# Patient Record
Sex: Female | Born: 1965 | Race: White | Hispanic: No | Marital: Married | Smoking: Never smoker
Health system: Southern US, Community
[De-identification: ages and names within clinical notes are randomized; demographics above are authoritative.]

## PROBLEM LIST (undated history)

## (undated) DIAGNOSIS — R011 Cardiac murmur, unspecified: Secondary | ICD-10-CM

## (undated) DIAGNOSIS — I10 Essential (primary) hypertension: Secondary | ICD-10-CM

## (undated) DIAGNOSIS — T7840XA Allergy, unspecified, initial encounter: Secondary | ICD-10-CM

## (undated) DIAGNOSIS — N390 Urinary tract infection, site not specified: Secondary | ICD-10-CM

## (undated) DIAGNOSIS — K219 Gastro-esophageal reflux disease without esophagitis: Secondary | ICD-10-CM

## (undated) HISTORY — DX: Urinary tract infection, site not specified: N39.0

## (undated) HISTORY — PX: WISDOM TOOTH EXTRACTION: SHX21

## (undated) HISTORY — DX: Allergy, unspecified, initial encounter: T78.40XA

## (undated) HISTORY — DX: Cardiac murmur, unspecified: R01.1

## (undated) HISTORY — DX: Essential (primary) hypertension: I10

---

## 2009-03-02 ENCOUNTER — Ambulatory Visit: Payer: Self-pay | Admitting: Internal Medicine

## 2011-10-10 ENCOUNTER — Encounter: Payer: Self-pay | Admitting: Internal Medicine

## 2011-10-10 ENCOUNTER — Ambulatory Visit (INDEPENDENT_AMBULATORY_CARE_PROVIDER_SITE_OTHER): Payer: PRIVATE HEALTH INSURANCE | Admitting: Internal Medicine

## 2011-10-10 VITALS — BP 154/82 | HR 68 | Temp 98.3°F | Resp 14 | Ht 64.0 in | Wt 132.5 lb

## 2011-10-10 DIAGNOSIS — M7712 Lateral epicondylitis, left elbow: Secondary | ICD-10-CM

## 2011-10-10 DIAGNOSIS — R030 Elevated blood-pressure reading, without diagnosis of hypertension: Secondary | ICD-10-CM

## 2011-10-10 DIAGNOSIS — M7710 Lateral epicondylitis, unspecified elbow: Secondary | ICD-10-CM

## 2011-10-10 DIAGNOSIS — Z0000 Encounter for general adult medical examination without abnormal findings: Secondary | ICD-10-CM | POA: Insufficient documentation

## 2011-10-10 NOTE — Assessment & Plan Note (Addendum)
General medical exam is normal today. Patient is up-to-date on Pap smear and mammogram. Will check basic lab work including CBC, CMP, lipid profile, vitamin D. BMI normal. Encouraged continued efforts at healthy diet and regular physical activity. Will obtain records from previous physician. Followup in one year or sooner if needed.

## 2011-10-10 NOTE — Patient Instructions (Signed)
Lateral Epicondylitis (Tennis Elbow) with Rehab Lateral epicondylitis involves inflammation and pain around the outer portion of the elbow. The pain is caused by inflammation of the tendons in the forearm that bring back (extend) the wrist. Lateral epicondylittis is also called tennis elbow, because it is very common in tennis players. However, it may occur in any individual who extends the wrist repetitively. If lateral epicondylitis is left untreated, it may become a chronic problem. SYMPTOMS   Pain, tenderness, and inflammation on the outer (lateral) side of the elbow.   Pain or weakness with gripping activities.   Pain that increases with wrist twisting motions (playing tennis, using a screwdriver, opening a door or a jar).   Pain with lifting objects, including a coffee cup.  CAUSES  Lateral epicondylitis is caused by inflammation of the tendons that extend the wrist. Causes of injury may include:  Repetitive stress and strain on the muscles and tendons that extend the wrist.   Sudden change in activity level or intensity.   Incorrect grip in racquet sports.   Incorrect grip size of racquet (often too large).   Incorrect hitting position or technique (usually backhand, leading with the elbow).   Using a racket that is too heavy.  RISK INCREASES WITH:  Sports or occupations that require repetitive and/or strenuous forearm and wrist movements (tennis, squash, racquetball, carpentry).   Poor wrist and forearm strength and flexibility.   Failure to warm up properly before activity.   Resuming activity before healing, rehabilitation, and conditioning are complete.  PREVENTION   Warm up and stretch properly before activity.   Maintain physical fitness:   Strength, flexibility, and endurance.   Cardiovascular fitness.   Wear and use properly fitted equipment.   Learn and use proper technique and have a coach correct improper technique.   Wear a tennis elbow  (counterforce) brace.  PROGNOSIS  The course of this condition depends on the degree of the injury. If treated properly, acute cases (symptoms lasting less than 4 weeks) are often resolved in 2 to 6 weeks. Chronic (longer lasting cases) often resolve in 3 to 6 months, but may require physical therapy. RELATED COMPLICATIONS   Frequently recurring symptoms, resulting in a chronic problem. Properly treating the problem the first time decreases frequency of recurrence.   Chronic inflammation, scarring tendon degeneration, and partial tendon tear, requiring surgery.   Delayed healing or resolution of symptoms.  TREATMENT  Treatment first involves the use of ice and medicine, to reduce pain and inflammation. Strengthening and stretching exercises may help reduce discomfort, if performed regularly. These exercises may be performed at home, if the condition is an acute injury. Chronic cases may require a referral to a physical therapist for evaluation and treatment. Your caregiver may advise a corticosteroid injection, to help reduce inflammation. Rarely, surgery is needed. MEDICATION  If pain medicine is needed, nonsteroidal anti-inflammatory medicines (aspirin and ibuprofen), or other minor pain relievers (acetaminophen), are often advised.   Do not take pain medicine for 7 days before surgery.   Prescription pain relievers may be given, if your caregiver thinks they are needed. Use only as directed and only as much as you need.   Corticosteroid injections may be recommended. These injections should be reserved only for the most severe cases, because they can only be given a certain number of times.  HEAT AND COLD  Cold treatment (icing) should be applied for 10 to 15 minutes every 2 to 3 hours for inflammation and pain, and immediately   after activity that aggravates your symptoms. Use ice packs or an ice massage.   Heat treatment may be used before performing stretching and strengthening  activities prescribed by your caregiver, physical therapist, or athletic trainer. Use a heat pack or a warm water soak.  SEEK MEDICAL CARE IF: Symptoms get worse or do not improve in 2 weeks, despite treatment. EXERCISES  RANGE OF MOTION (ROM) AND STRETCHING EXERCISES - Epicondylitis, Lateral (Tennis Elbow) These exercises may help you when beginning to rehabilitate your injury. Your symptoms may go away with or without further involvement from your physician, physical therapist or athletic trainer. While completing these exercises, remember:   Restoring tissue flexibility helps normal motion to return to the joints. This allows healthier, less painful movement and activity.   An effective stretch should be held for at least 30 seconds.   A stretch should never be painful. You should only feel a gentle lengthening or release in the stretched tissue.  RANGE OF MOTION - Wrist Flexion, Active-Assisted  Extend your right / left elbow with your fingers pointing down.*   Gently pull the back of your hand towards you, until you feel a gentle stretch on the top of your forearm.   Hold this position for __________ seconds.  Repeat __________ times. Complete this exercise __________ times per day.  *If directed by your physician, physical therapist or athletic trainer, complete this stretch with your elbow bent, rather than extended. RANGE OF MOTION - Wrist Extension, Active-Assisted  Extend your right / left elbow and turn your palm upwards.*   Gently pull your palm and fingertips back, so your wrist extends and your fingers point more toward the ground.   You should feel a gentle stretch on the inside of your forearm.   Hold this position for __________ seconds.  Repeat __________ times. Complete this exercise __________ times per day. *If directed by your physician, physical therapist or athletic trainer, complete this stretch with your elbow bent, rather than extended. STRETCH - Wrist  Flexion  Place the back of your right / left hand on a tabletop, leaving your elbow slightly bent. Your fingers should point away from your body.   Gently press the back of your hand down onto the table by straightening your elbow. You should feel a stretch on the top of your forearm.   Hold this position for __________ seconds.  Repeat __________ times. Complete this stretch __________ times per day.  STRETCH - Wrist Extension   Place your right / left fingertips on a tabletop, leaving your elbow slightly bent. Your fingers should point backwards.   Gently press your fingers and palm down onto the table by straightening your elbow. You should feel a stretch on the inside of your forearm.   Hold this position for __________ seconds.  Repeat __________ times. Complete this stretch __________ times per day.  STRENGTHENING EXERCISES - Epicondylitis, Lateral (Tennis Elbow) These exercises may help you when beginning to rehabilitate your injury. They may resolve your symptoms with or without further involvement from your physician, physical therapist or athletic trainer. While completing these exercises, remember:   Muscles can gain both the endurance and the strength needed for everyday activities through controlled exercises.   Complete these exercises as instructed by your physician, physical therapist or athletic trainer. Increase the resistance and repetitions only as guided.   You may experience muscle soreness or fatigue, but the pain or discomfort you are trying to eliminate should never worsen during these exercises. If   this pain does get worse, stop and make sure you are following the directions exactly. If the pain is still present after adjustments, discontinue the exercise until you can discuss the trouble with your caregiver.  STRENGTH - Wrist Flexors  Sit with your right / left forearm palm-up and fully supported on a table or countertop. Your elbow should be resting below the  height of your shoulder. Allow your wrist to extend over the edge of the surface.   Loosely holding a __________ weight, or a piece of rubber exercise band or tubing, slowly curl your hand up toward your forearm.   Hold this position for __________ seconds. Slowly lower the wrist back to the starting position in a controlled manner.  Repeat __________ times. Complete this exercise __________ times per day.  STRENGTH - Wrist Extensors  Sit with your right / left forearm palm-down and fully supported on a table or countertop. Your elbow should be resting below the height of your shoulder. Allow your wrist to extend over the edge of the surface.   Loosely holding a __________ weight, or a piece of rubber exercise band or tubing, slowly curl your hand up toward your forearm.   Hold this position for __________ seconds. Slowly lower the wrist back to the starting position in a controlled manner.  Repeat __________ times. Complete this exercise __________ times per day.  STRENGTH - Ulnar Deviators  Stand with a ____________________ weight in your right / left hand, or sit while holding a rubber exercise band or tubing, with your healthy arm supported on a table or countertop.   Move your wrist, so that your pinkie travels toward your forearm and your thumb moves away from your forearm.   Hold this position for __________ seconds and then slowly lower the wrist back to the starting position.  Repeat __________ times. Complete this exercise __________ times per day STRENGTH - Radial Deviators  Stand with a ____________________ weight in your right / left hand, or sit while holding a rubber exercise band or tubing, with your injured arm supported on a table or countertop.   Raise your hand upward in front of you or pull up on the rubber tubing.   Hold this position for __________ seconds and then slowly lower the wrist back to the starting position.  Repeat __________ times. Complete this  exercise __________ times per day. STRENGTH - Forearm Supinators   Sit with your right / left forearm supported on a table, keeping your elbow below shoulder height. Rest your hand over the edge, palm down.   Gently grip a hammer or a soup ladle.   Without moving your elbow, slowly turn your palm and hand upward to a "thumbs-up" position.   Hold this position for __________ seconds. Slowly return to the starting position.  Repeat __________ times. Complete this exercise __________ times per day.  STRENGTH - Forearm Pronators   Sit with your right / left forearm supported on a table, keeping your elbow below shoulder height. Rest your hand over the edge, palm up.   Gently grip a hammer or a soup ladle.   Without moving your elbow, slowly turn your palm and hand upward to a "thumbs-up" position.   Hold this position for __________ seconds. Slowly return to the starting position.  Repeat __________ times. Complete this exercise __________ times per day.  STRENGTH - Grip  Grasp a tennis ball, a dense sponge, or a large, rolled sock in your hand.   Squeeze as hard   as you can, without increasing any pain.   Hold this position for __________ seconds. Release your grip slowly.  Repeat __________ times. Complete this exercise __________ times per day.  STRENGTH - Elbow Extensors, Isometric  Stand or sit upright, on a firm surface. Place your right / left arm so that your palm faces your stomach, and it is at the height of your waist.   Place your opposite hand on the underside of your forearm. Gently push up as your right / left arm resists. Push as hard as you can with both arms, without causing any pain or movement at your right / left elbow. Hold this stationary position for __________ seconds.  Gradually release the tension in both arms. Allow your muscles to relax completely before repeating. Document Released: 04/18/2005 Document Revised: 04/07/2011 Document Reviewed:  07/31/2008 [employer] Patient Information 2012 [employer]. 

## 2011-10-10 NOTE — Assessment & Plan Note (Addendum)
Secondary to white coat hypertension. Better control reported at home. Will monitor. Pt will call if consistently >140/90.

## 2011-10-10 NOTE — Progress Notes (Signed)
Subjective:    Patient ID: Erika Daugherty, female    DOB: 1965/12/10, 46 y.o.   MRN: [id number]  HPI 46 year old female presents to establish care. She reports she is generally feeling well. She is concerned about recent weight gain, particularly in her abdomen. She has been exercising and following a healthy diet. She exercises typically 5 days per week. She notes some difficulty getting in fiber because of stomach upset. She denies fatigue, or other focal symptoms.  She also notes several month history of left elbow pain. She works as a Armed forces operational officer and reports that she frequently swings her left forearm during her workday. She has noticed some pain over her lateral epicondyle. She has not taken any medication for this. Pain typically is resolved on the weekends.  Outpatient Encounter Prescriptions as of 10/10/2011  Medication Sig Dispense Refill  . cetirizine (ZYRTEC) 10 MG tablet Take 10 mg by mouth daily.      . Cholecalciferol (VITAMIN D) 2000 UNITS CAPS Take by mouth daily.      Marland Kitchen levonorgestrel-ethinyl estradiol (NORDETTE) 0.15-30 MG-MCG tablet Take 1 tablet by mouth daily.        Review of Systems  Constitutional: Negative for fever, chills, appetite change, fatigue and unexpected weight change.  HENT: Negative for ear pain, congestion, sore throat, trouble swallowing, neck pain, voice change and sinus pressure.   Eyes: Negative for visual disturbance.  Respiratory: Negative for cough, shortness of breath, wheezing and stridor.   Cardiovascular: Negative for chest pain, palpitations and leg swelling.  Gastrointestinal: Negative for nausea, vomiting, abdominal pain, diarrhea, constipation, blood in stool, abdominal distention and anal bleeding.  Genitourinary: Negative for dysuria and flank pain.  Musculoskeletal: Negative for myalgias, arthralgias and gait problem.  Skin: Negative for color change and rash.  Neurological: Negative for dizziness and headaches.  Hematological:  Negative for adenopathy. Does not bruise/bleed easily.  Psychiatric/Behavioral: Negative for suicidal ideas, sleep disturbance and dysphoric mood. The patient is not nervous/anxious.    BP 154/82  Pulse 68  Temp(Src) 98.3 F (36.8 C) (Oral)  Resp 14  Ht 5\' 4"  (1.626 m)  Wt 132 lb 8 oz (60.102 kg)  BMI 22.74 kg/m2  SpO2 98%  LMP 09/09/2011     Objective:   Physical Exam  Constitutional: She is oriented to person, place, and time. She appears well-developed and well-nourished. No distress.  HENT:  Head: Normocephalic and atraumatic.  Right Ear: External ear normal.  Left Ear: External ear normal.  Nose: Nose normal.  Mouth/Throat: Oropharynx is clear and moist. No oropharyngeal exudate.  Eyes: Conjunctivae are normal. Pupils are equal, round, and reactive to light. Right eye exhibits no discharge. Left eye exhibits no discharge. No scleral icterus.  Neck: Normal range of motion. Neck supple. No tracheal deviation present. No thyromegaly present.  Cardiovascular: Normal rate, regular rhythm, normal heart sounds and intact distal pulses.  Exam reveals no gallop and no friction rub.   No murmur heard. Pulmonary/Chest: Effort normal and breath sounds normal. No respiratory distress. She has no wheezes. She has no rales. She exhibits no tenderness.  Musculoskeletal: Normal range of motion. She exhibits no edema and no tenderness.       Left elbow: She exhibits normal range of motion, no swelling and no effusion. tenderness found. Lateral epicondyle tenderness noted.  Lymphadenopathy:    She has no cervical adenopathy.  Neurological: She is alert and oriented to person, place, and time. No cranial nerve deficit. She exhibits normal muscle tone. Coordination normal.  Skin: Skin is warm and dry. No rash noted. She is not diaphoretic. No erythema. No pallor.  Psychiatric: She has a normal mood and affect. Her behavior is normal. Judgment and thought content normal.          Assessment  & Plan:

## 2011-10-10 NOTE — Assessment & Plan Note (Signed)
Symptoms are consistent with lateral epicondylitis of the left elbow. Likely secondary to overuse at work. Patient was given information on lateral epicondylitis. We discussed using ibuprofen 800 mg up to 3 times daily. We also discussed applying ice to her elbow and using a sling to prevent overuse. If symptoms are persisting, she will call or e-mail and we will set up referral to sports medicine.

## 2011-10-11 LAB — CBC WITH DIFFERENTIAL/PLATELET
Basophils Relative: 0.6 % (ref 0.0–3.0)
Eosinophils Relative: 2.6 % (ref 0.0–5.0)
HCT: 40.8 % (ref 36.0–46.0)
Lymphs Abs: 1.8 10*3/uL (ref 0.7–4.0)
MCV: 93.9 fl (ref 78.0–100.0)
Monocytes Absolute: 0.5 10*3/uL (ref 0.1–1.0)
Monocytes Relative: 8.6 % (ref 3.0–12.0)
Neutrophils Relative %: 55.8 % (ref 43.0–77.0)
RBC: 4.34 Mil/uL (ref 3.87–5.11)
WBC: 5.6 10*3/uL (ref 4.5–10.5)

## 2011-10-11 LAB — COMPREHENSIVE METABOLIC PANEL
Albumin: 4.1 g/dL (ref 3.5–5.2)
BUN: 11 mg/dL (ref 6–23)
CO2: 26 mEq/L (ref 19–32)
Calcium: 9.2 mg/dL (ref 8.4–10.5)
Chloride: 107 mEq/L (ref 96–112)
GFR: 79.28 mL/min (ref >60.00)
Glucose, Bld: 76 mg/dL (ref 70–99)
Potassium: 4.1 mEq/L (ref 3.5–5.1)
Total Protein: 7.9 g/dL (ref 6.0–8.3)

## 2011-10-11 LAB — LIPID PANEL: Cholesterol: 157 mg/dL (ref 0–200)

## 2011-10-11 LAB — VITAMIN D 25 HYDROXY (VIT D DEFICIENCY, FRACTURES): Vit D, 25-Hydroxy: 31 ng/mL (ref 30–89)

## 2011-10-11 LAB — TSH: TSH: 0.48 u[IU]/mL (ref 0.35–5.50)

## 2011-10-21 ENCOUNTER — Encounter: Payer: Self-pay | Admitting: Internal Medicine

## 2011-11-17 ENCOUNTER — Encounter: Payer: Self-pay | Admitting: Internal Medicine

## 2011-11-18 ENCOUNTER — Telehealth: Payer: Self-pay | Admitting: Internal Medicine

## 2011-11-18 NOTE — Telephone Encounter (Signed)
LMP: 11/09/11 Caller: Erika Daugherty/Patient; PCP: Ronna Polio; CB#: [phone];  Call regarding High Blood Pressure on 11/17/11= 140/80 and rechecked several times and had one reading up to 160/100 but was feeling anxious about it at the time; Headache behind L eye on 11/15/11 -took Motrin- 2 tabs PO and it went away within 20 min.  Today BP= 120/70. She just started running every other day in June 2013. Triage and Care advice per Hypertension Protocol and advised to check pressure weekly or more often if having sx of headache, anxiousness, or visual disturbances and call for appointment if >140/80 at 2 separate times or with any concerns.

## 2012-01-30 ENCOUNTER — Ambulatory Visit (INDEPENDENT_AMBULATORY_CARE_PROVIDER_SITE_OTHER): Payer: PRIVATE HEALTH INSURANCE | Admitting: Internal Medicine

## 2012-01-30 ENCOUNTER — Encounter: Payer: Self-pay | Admitting: Internal Medicine

## 2012-01-30 VITALS — BP 132/74 | HR 97 | Temp 98.3°F | Ht 63.0 in | Wt 127.5 lb

## 2012-01-30 DIAGNOSIS — R5381 Other malaise: Secondary | ICD-10-CM

## 2012-01-30 DIAGNOSIS — J329 Chronic sinusitis, unspecified: Secondary | ICD-10-CM | POA: Insufficient documentation

## 2012-01-30 DIAGNOSIS — R5383 Other fatigue: Secondary | ICD-10-CM | POA: Insufficient documentation

## 2012-01-30 MED ORDER — AMOXICILLIN 875 MG PO TABS: 875.0000 mg | ORAL_TABLET | Freq: Two times a day (BID) | ORAL | Status: DC

## 2012-01-30 NOTE — Assessment & Plan Note (Signed)
Treat with Amoxicillin 875mg  bid x 10 days.  Saline nasal spray - flush nose at least 2-3x/day.  Afrin nasal spray - 2 sprays each nostril bid x 3 days and then stop.  Plain Robitussin bid prn as directed.  Rest.  Fluids.

## 2012-01-30 NOTE — Patient Instructions (Signed)
It was nice meeting you today.  I do agree that you have a sinus infection.  Take your amoxicillin twice a day as instructed.  Saline nasal spray - flush nose at least 2-3x/day.  Afrin nasal spray - 2 sprays each nostril 2x/day for three days only and then stop.  Plain Robitussin prn as discussed.  Rest.  Fluids.  Return or call if symptoms do not resolve.  Let us know if the fatigue does not resolve with treating the sinus infection.

## 2012-01-30 NOTE — Assessment & Plan Note (Signed)
Feel probably related to her current infection.  Will treat the sinus infection.  If persistant symptoms, she will call or return for further w/up and evaluation.

## 2012-01-30 NOTE — Progress Notes (Signed)
  Subjective:    Patient ID: Erika Daugherty, female    DOB: 03-Aug-1965, 46 y.o.   MRN: [id number]  HPI  46 year old female here as an acute visit with sinus problems.    Past Medical History  Diagnosis Date  . Allergy     Outpatient Prescriptions Prior to Visit  Medication Sig Dispense Refill  . levonorgestrel-ethinyl estradiol (NORDETTE) 0.15-30 MG-MCG tablet Take 1 tablet by mouth daily.      . cetirizine (ZYRTEC) 10 MG tablet Take 10 mg by mouth daily.      . Cholecalciferol (VITAMIN D) 2000 UNITS CAPS Take by mouth daily.        Review of Systems   Reports she has had problems with increased allergy symptoms and sinus issues over the last two months.  Symptoms worsened one week ago.  Reports increased sinus pressure and nasal congestion (occasionally productive of green mucus).   Some ear fullness and discomfort associated with the sinus pressure (involving the left ear).  No documented fever or chills.  No sore throat, but does report increased postnasal drainage.  No increased chest congestion, but does report some minimal cough.  No shortness of breath, chest pain or tightness.  No wheezing.  No nausea or vomiting.  Tolerating po's - without problems.  Some decreased appetite and fatigue.            Objective:   Physical Exam  46 year old in no acute distress.   HEENT:  Nares - clear except sightly erythematous turbinates.  OP- without lesions or erythema.  TMs visualized without increased erythema.  Some cerumen present.  NECK:  Supple, nontender.  No audible abdominal bruit.   HEART:  Appears to be regular. LUNGS:  Without crackles or wheezing audible.  Respirations even and unlabored.                                                                      Assessment & Plan:

## 2012-02-01 ENCOUNTER — Ambulatory Visit (INDEPENDENT_AMBULATORY_CARE_PROVIDER_SITE_OTHER): Payer: PRIVATE HEALTH INSURANCE | Admitting: Internal Medicine

## 2012-02-01 ENCOUNTER — Telehealth: Payer: Self-pay | Admitting: Internal Medicine

## 2012-02-01 ENCOUNTER — Encounter: Payer: Self-pay | Admitting: Internal Medicine

## 2012-02-01 VITALS — BP 128/76 | HR 69 | Temp 98.3°F | Ht 64.0 in | Wt 124.8 lb

## 2012-02-01 DIAGNOSIS — H6980 Other specified disorders of Eustachian tube, unspecified ear: Secondary | ICD-10-CM

## 2012-02-01 MED ORDER — PREDNISONE (PAK) 10 MG PO TABS: ORAL_TABLET | ORAL | Status: DC

## 2012-02-01 NOTE — Telephone Encounter (Signed)
Will address during appt today.

## 2012-02-01 NOTE — Progress Notes (Signed)
Patient ID: Erika Daugherty, female   DOB: Sep 08, 1965, 46 y.o.   MRN: [id number]  Patient Active Problem List  Diagnosis  . General medical exam  . Lateral epicondylitis of left elbow  . Elevated BP  . Sinusitis  . Fatigue  . Eustachian tube dysfunction    Subjective:  CC:   Chief Complaint  Patient presents with  . Otalgia    HPI:   Erika Daugherty a 46 y.o. female who presents Was treated for otitis 2 days by Dr. Lorin Picket with abx and Afrin.   Past Medical History  Diagnosis Date  . Allergy     Past Surgical History  Procedure Date  . Vaginal delivery     2  . Wisdom tooth extraction     The following portions of the patient's history were reviewed and updated as appropriate: Allergies, current medications, and problem list.    Review of Systems:   12 Pt  review of systems was negative except those addressed in the HPI,     History   Social History  . Marital Status: Married    Spouse Name: N/A    Number of Children: N/A  . Years of Education: N/A   Occupational History  . Not on file.   Social History Main Topics  . Smoking status: Never Smoker   . Smokeless tobacco: Never Used  . Alcohol Use: Yes     rare  . Drug Use: No  . Sexually Active: Not on file   Other Topics Concern  . Not on file   Social History Narrative   Lives in [place], [place] with husband and two children (16YO, 11YO) and dog.Work - Chemical engineer - relative healthyExercise - walk or elliptical 5 days per week    Objective:  BP 128/76  Pulse 69  Temp 98.3 F (36.8 C) (Oral)  Ht 5\' 4"  (1.626 m)  Wt 124 lb 12 oz (56.586 kg)  BMI 21.41 kg/m2  SpO2 99%  LMP 12/28/2011  General appearance: alert, cooperative and appears stated age Ears: normal TM's and external ear canals both ears Throat: lips, mucosa, and tongue normal; teeth and gums normal Neck: no adenopathy, no carotid bruit, supple, symmetrical, trachea midline and thyroid not enlarged, symmetric, no  tenderness/mass/nodules Back: symmetric, no curvature. ROM normal. No CVA tenderness. Lungs: clear to auscultation bilaterally Heart: regular rate and rhythm, S1, S2 normal, no murmur, click, rub or gallop Abdomen: soft, non-tender; bowel sounds normal; no masses,  no organomegaly Pulses: 2+ and symmetric Skin: Skin color, texture, turgor normal. No rashes or lesions Lymph nodes: Cervical, supraclavicular, and axillary nodes normal.  Assessment and Plan:  Eustachian tube dysfunction secondary to sinus congestion.  continue afrin add sudafed pe and prednisone    Updated Medication List Outpatient Encounter Prescriptions as of 02/01/2012  Medication Sig Dispense Refill  . amoxicillin (AMOXIL) 875 MG tablet Take 1 tablet (875 mg total) by mouth 2 (two) times daily.  20 tablet  0  . cetirizine (ZYRTEC) 10 MG tablet Take 10 mg by mouth daily.      . Cholecalciferol (VITAMIN D) 2000 UNITS CAPS Take by mouth daily.      Marland Kitchen levonorgestrel-ethinyl estradiol (NORDETTE) 0.15-30 MG-MCG tablet Take 1 tablet by mouth daily.      Marland Kitchen omeprazole (PRILOSEC) 10 MG capsule Take 10 mg by mouth daily.      . predniSONE (STERAPRED UNI-PAK) 10 MG tablet 6 tablets on Day 1 , then reduce by 1 tablet daily until gone  21 tablet  0     No orders of the defined types were placed in this encounter.    No Follow-up on file.

## 2012-02-01 NOTE — Telephone Encounter (Signed)
Caller: [name]/Patient; Patient Name: Erika Daugherty; PCP: Ronna Polio (Adults only); Best Callback Phone Number: [phone] Caller seen in clinic on 01/30/12 and diagnsoed with sinus infection. Dr. Lorin Picket prescribed  Amoxicillen 875mg  bid which patient started the pm of 01/30/12. Patient is also using Afrin nasal spray bid as recommended by MD. [name] calling today because  ear pain has started becoming more prominent since 10/01 and having episodes of diziness. Pain is to left ear and dizziness occurs with turning head to left. 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 Dizziness appeard improved last pm and again worse this am. Patient did not attend work today due to symptoms. Ear pain located to flap covering ear canal and radiates along bone to front of ear up to temporal at times. Denies redness or swelling. Denies fever. All emergent symptoms ruled out per Dizziness or Vertigo with exception " previously evaluated and worsening symptoms interfering with ability to carry out activities of daily living ". Appointment scheduled for patient to be see today (10/02) at 4:15pm by Dr. Darrick Huntsman.   Patient is requesting Dr. Lorin Picket be informed about her worsening symptoms and her appointment scheduled for today.

## 2012-02-01 NOTE — Telephone Encounter (Signed)
Will address at visit today.

## 2012-02-01 NOTE — Patient Instructions (Addendum)
I think your inner ear is causing your symptoms of dizziness and pain.  I am prescribing a prednisone taper to take for the next 6 days    Continue the augmentin.     I would like you to start the sudafed PE 10 to 30 mg every 6 hours,  Omit the evening dose if it wires you  And just use the afrin instead of the pill at bedtime.  Flush your sinuses twice daily with Simply Saline before you use the Afrin

## 2012-02-01 NOTE — Telephone Encounter (Signed)
Pt is still having problems with sinus pain. Pain in ear and very dizzy. She was wondering what else she should do ??

## 2012-02-02 ENCOUNTER — Encounter: Payer: Self-pay | Admitting: Internal Medicine

## 2012-02-02 DIAGNOSIS — H6980 Other specified disorders of Eustachian tube, unspecified ear: Secondary | ICD-10-CM | POA: Insufficient documentation

## 2012-02-02 NOTE — Assessment & Plan Note (Signed)
secondary to sinus congestion.  continue afrin add sudafed pe and prednisone

## 2012-02-03 ENCOUNTER — Telehealth: Payer: Self-pay | Admitting: Internal Medicine

## 2012-02-03 NOTE — Telephone Encounter (Signed)
I would recommend stopping Prednisone, as this may be causing heart fluttering.  If symptoms of heart palpitations do not improve then pt will need to be evaluated in the ED or urgent care as may have another issue.

## 2012-02-03 NOTE — Telephone Encounter (Signed)
Caller: [name]/Patient; Patient Name: Erika Daugherty; PCP: Duncan Dull (Adults only); Best Callback Phone Number: [phone].  She states was seen in the office Monday , 01/30/12 and was diagnosed with sinus infeciton. She was placed on Amoxicillen and Afrin medication.  She came back in on Wednesday 02/01/12 due to new onset of dizziness and left ear pain and saw Dr. Darrick Huntsman.  She was left on Amoxicillen and added Prednisone for ear issues.  Dosage- Prednisone 10mg  taper.  Since then she is having "heart racing" with exertion . At present resting with no symptoms. Eating and drinking normally. [id number].  At present doing well but is concerned with the prednisone and side effects. Health information reviewed on Prednisone.  Emergent s/sx ruled out per Chest pain Protocol with exception to All other situations/ Side effects of medication/symptoms relieved with rest.  Home Care instructions reviewed with emergent s/sx reviewed with Patient. Advised that I will forward medication/symptoms to physician. DO NOT TAKE PREDNISONE UNTIL PHYSICIAN APPROVED.  CONTACT PATIENT FOR HEART FLUTTERING WITH EXERTION WHILE TAKING PREDNISONE AS SOON AS POSSIBLE

## 2012-02-03 NOTE — Telephone Encounter (Signed)
Caller: Erika Daugherty/Patient; Patient Name: Erika Daugherty; PCP: Ronna Polio (Adults only); Best Callback Phone Number: [phone]; Has headache for intermittently since 02/02/12.  Rate 4/10 pain scale and is located above eyes and spreading to temples.  Afebrile; LMP 02/01/12. Triaged using Headache with a disposition to see provider within 24 hours due to symptoms began after starting Prednisone.  However, patient has already talked with office who told her to stop taking the Prednisone.  Caller wanted to be sure that it would be ok to take something for her head-RN advised Tylenol as directed on package.  Care advice given.  Caller demonstrated understanding.

## 2012-02-03 NOTE — Telephone Encounter (Signed)
Patient advised as instructed via telephone. 

## 2012-02-06 NOTE — Telephone Encounter (Signed)
Fine for her to take prn tylenol for headache pain.  We should put her in next open appointment.

## 2012-02-06 NOTE — Telephone Encounter (Signed)
Patient advised as instructed via telephone, she has an appt scheduled with Dr. Dan Humphreys tomorrow.

## 2012-02-07 ENCOUNTER — Encounter: Payer: Self-pay | Admitting: Internal Medicine

## 2012-02-07 ENCOUNTER — Ambulatory Visit (INDEPENDENT_AMBULATORY_CARE_PROVIDER_SITE_OTHER): Payer: PRIVATE HEALTH INSURANCE | Admitting: Internal Medicine

## 2012-02-07 VITALS — BP 110/80 | HR 67 | Temp 98.7°F | Ht 64.0 in | Wt 124.5 lb

## 2012-02-07 DIAGNOSIS — R002 Palpitations: Secondary | ICD-10-CM

## 2012-02-07 DIAGNOSIS — R51 Headache: Secondary | ICD-10-CM

## 2012-02-07 DIAGNOSIS — R519 Headache, unspecified: Secondary | ICD-10-CM | POA: Insufficient documentation

## 2012-02-07 LAB — CBC WITH DIFFERENTIAL/PLATELET
Basophils Absolute: 0.1 10*3/uL (ref 0.0–0.1)
Eosinophils Relative: 1.5 % (ref 0.0–5.0)
HCT: 41.9 % (ref 36.0–46.0)
Hemoglobin: 14.1 g/dL (ref 12.0–15.0)
Lymphocytes Relative: 28.2 % (ref 12.0–46.0)
Lymphs Abs: 1.9 10*3/uL (ref 0.7–4.0)
Monocytes Relative: 6.6 % (ref 3.0–12.0)
Platelets: 296 10*3/uL (ref 150.0–400.0)
WBC: 6.9 10*3/uL (ref 4.5–10.5)

## 2012-02-07 LAB — COMPREHENSIVE METABOLIC PANEL
ALT: 16 U/L (ref 0–35)
AST: 17 U/L (ref 0–37)
Albumin: 3.8 g/dL (ref 3.5–5.2)
CO2: 27 mEq/L (ref 19–32)
Calcium: 8.8 mg/dL (ref 8.4–10.5)
Chloride: 102 mEq/L (ref 96–112)
Creatinine, Ser: 0.8 mg/dL (ref 0.4–1.2)
GFR: 83.81 mL/min (ref >60.00)
Potassium: 3.5 mEq/L (ref 3.5–5.1)
Sodium: 137 mEq/L (ref 135–145)
Total Protein: 7.6 g/dL (ref 6.0–8.3)

## 2012-02-07 LAB — SEDIMENTATION RATE: Sed Rate: 8 mm/hr (ref 0–22)

## 2012-02-07 LAB — TSH: TSH: 1.03 u[IU]/mL (ref 0.35–5.50)

## 2012-02-07 NOTE — Progress Notes (Signed)
Subjective:    Patient ID: Erika Daugherty, female    DOB: 02-22-1966, 46 y.o.   MRN: [id number]  HPI 46 year old female presents for followup after recent episode of left otitis media which she was treated with amoxicillin and prednisone. She ultimately had a reaction to prednisone with palpitations and increased anxiety. This resolved after stopping the medication. She continues to have left-sided facial pressure and pain. She denies any noted nasal congestion, fever, chills. She denies cough or shortness of breath.  Outpatient Encounter Prescriptions as of 02/07/2012  Medication Sig Dispense Refill  . amoxicillin (AMOXIL) 875 MG tablet Take 1 tablet (875 mg total) by mouth 2 (two) times daily.  20 tablet  0  . Cholecalciferol (VITAMIN D) 2000 UNITS CAPS Take by mouth daily.      Marland Kitchen levonorgestrel-ethinyl estradiol (NORDETTE) 0.15-30 MG-MCG tablet Take 1 tablet by mouth daily.      Marland Kitchen omeprazole (PRILOSEC) 10 MG capsule Take 10 mg by mouth daily.      . cetirizine (ZYRTEC) 10 MG tablet Take 10 mg by mouth daily.      Marland Kitchen DISCONTD: predniSONE (STERAPRED UNI-PAK) 10 MG tablet 6 tablets on Day 1 , then reduce by 1 tablet daily until gone  21 tablet  0   BP 110/80  Pulse 67  Temp 98.7 F (37.1 C) (Oral)  Ht 5\' 4"  (1.626 m)  Wt 124 lb 8 oz (56.473 kg)  BMI 21.37 kg/m2  SpO2 97%  LMP 02/01/2012  Review of Systems  Constitutional: Negative for fever, chills and unexpected weight change.  HENT: Positive for sinus pressure. Negative for hearing loss, ear pain, nosebleeds, congestion, sore throat, facial swelling, rhinorrhea, sneezing, mouth sores, trouble swallowing, neck pain, neck stiffness, voice change, postnasal drip, tinnitus and ear discharge.   Eyes: Negative for pain, discharge, redness and visual disturbance.  Respiratory: Negative for cough, chest tightness, shortness of breath, wheezing and stridor.   Cardiovascular: Positive for palpitations. Negative for chest pain and leg swelling.    Musculoskeletal: Negative for myalgias and arthralgias.  Skin: Negative for color change and rash.  Neurological: Negative for dizziness, weakness, light-headedness and headaches.  Hematological: Negative for adenopathy.       Objective:   Physical Exam  Constitutional: She is oriented to person, place, and time. She appears well-developed and well-nourished. No distress.  HENT:  Head: Normocephalic and atraumatic.    Right Ear: External ear normal. Tympanic membrane is not bulging. No middle ear effusion.  Left Ear: External ear normal. Tympanic membrane is not bulging.  No middle ear effusion.  Nose: Nose normal.  Mouth/Throat: Oropharynx is clear and moist. No oropharyngeal exudate.  Eyes: Conjunctivae normal are normal. Pupils are equal, round, and reactive to light. Right eye exhibits no discharge. Left eye exhibits no discharge. No scleral icterus.  Neck: Normal range of motion. Neck supple. No tracheal deviation present. No thyromegaly present.  Cardiovascular: Normal rate, regular rhythm, normal heart sounds and intact distal pulses.  Exam reveals no gallop and no friction rub.   No murmur heard. Pulmonary/Chest: Effort normal and breath sounds normal. No respiratory distress. She has no wheezes. She has no rales. She exhibits no tenderness.  Musculoskeletal: Normal range of motion. She exhibits no edema and no tenderness.  Lymphadenopathy:    She has no cervical adenopathy.  Neurological: She is alert and oriented to person, place, and time. No cranial nerve deficit. She exhibits normal muscle tone. Coordination normal.  Skin: Skin is warm and dry. No rash  noted. She is not diaphoretic. No erythema. No pallor.  Psychiatric: She has a normal mood and affect. Her behavior is normal. Judgment and thought content normal.          Assessment & Plan:

## 2012-02-07 NOTE — Assessment & Plan Note (Signed)
Patient with recent episode of palpitations in the setting of use of prednisone. Symptoms resolved with stopping prednisone. Will continue to monitor.

## 2012-02-07 NOTE — Assessment & Plan Note (Signed)
Patient with symptoms of persistent left-sided facial pressure and pain despite treatment with amoxicillin and prednisone(shortened course). Exam is normal today. Question if she may have obstruction of her maxillary sinus leading to ongoing symptoms. Will set up ENT evaluation.

## 2012-03-05 ENCOUNTER — Ambulatory Visit (INDEPENDENT_AMBULATORY_CARE_PROVIDER_SITE_OTHER): Payer: PRIVATE HEALTH INSURANCE | Admitting: Internal Medicine

## 2012-03-05 ENCOUNTER — Encounter: Payer: Self-pay | Admitting: Internal Medicine

## 2012-03-05 VITALS — BP 132/70 | HR 72 | Temp 98.3°F | Ht 64.0 in | Wt 121.5 lb

## 2012-03-05 DIAGNOSIS — R51 Headache: Secondary | ICD-10-CM

## 2012-03-05 DIAGNOSIS — R002 Palpitations: Secondary | ICD-10-CM | POA: Insufficient documentation

## 2012-03-05 DIAGNOSIS — R5383 Other fatigue: Secondary | ICD-10-CM

## 2012-03-05 DIAGNOSIS — R5381 Other malaise: Secondary | ICD-10-CM

## 2012-03-05 NOTE — Assessment & Plan Note (Signed)
Symptoms of fatigue over the last few months. Labs including CBC, CMP, and TSH were normal. Question this may be related to sleep apnea given ongoing sinus issues. Will set up a sleep study.

## 2012-03-05 NOTE — Assessment & Plan Note (Signed)
Intermittent symptoms of palpitations has been persistent over the last few months. EKG is normal today except for her short PR interval. Will set up cardiology evaluation possibly to include Holter monitor for additional valuation. Followup here 4 weeks.

## 2012-03-05 NOTE — Assessment & Plan Note (Signed)
Exam is normal today but symptoms of left-sided facial pressure and pain are intermittent. Patient was seen by ENT and had CT scan of the sinuses which showed a bone spur on the left. Question if this may be contributing to symptoms. Encourage patient to schedule followup with ENT to discuss removal of the spur if symptoms are persistent.

## 2012-03-05 NOTE — Progress Notes (Signed)
Subjective:    Patient ID: Erika Daugherty, female    DOB: 1966-03-15, 46 y.o.   MRN: [id number]  HPI 46 year old female with history of recurrent left-sided otitis media and maxillary sinus infection presents for followup. She reports persistent episodes of pain in her left cheek and year. She was seen by ENT physician and had CT of the head which showed bone spur in the left maxillary sinus. She reports that they've discussed potentially removing his to help with symptoms but followup was not scheduled. At present, she is using intermittent ibuprofen to help with symptoms.  She is also concerned today about ongoing fatigue. She reports over the last several months she has felt quite fatigued. She denies any focal symptoms such as chest pain, change in bowel habits, change in appetite. She has had some intermittent episodes of palpitations described as "fluttering" but not associated with chest pain, diaphoresis, nausea, or dyspnea. These last for a few seconds and then resolve without any intervention. They occur at rest.  She does follow a healthy exercise program using an elliptical machine several times per week and has not had episodes of palpitations during those episodes. She feels that she gets adequate sleep. Her husband however has noted that she sleeps with her mouth open.  Outpatient Encounter Prescriptions as of 03/05/2012  Medication Sig Dispense Refill  . Cholecalciferol (VITAMIN D) 2000 UNITS CAPS Take by mouth daily.      . fluticasone (FLONASE) 50 MCG/ACT nasal spray Place 2 sprays into the nose daily.      Marland Kitchen levonorgestrel-ethinyl estradiol (NORDETTE) 0.15-30 MG-MCG tablet Take 1 tablet by mouth daily.       BP 132/70  Pulse 72  Temp 98.3 F (36.8 C) (Oral)  Ht 5\' 4"  (1.626 m)  Wt 121 lb 8 oz (55.112 kg)  BMI 20.86 kg/m2  SpO2 98%  LMP 02/28/2012  Review of Systems  Constitutional: Positive for fatigue. Negative for fever, chills, appetite change and unexpected weight  change.  HENT: Positive for congestion and sinus pressure. Negative for ear pain, sore throat, trouble swallowing, neck pain and voice change.   Eyes: Negative for visual disturbance.  Respiratory: Negative for cough, shortness of breath, wheezing and stridor.   Cardiovascular: Positive for palpitations. Negative for chest pain and leg swelling.  Gastrointestinal: Negative for nausea, vomiting, abdominal pain, diarrhea, constipation, blood in stool, abdominal distention and anal bleeding.  Genitourinary: Negative for dysuria and flank pain.  Musculoskeletal: Negative for myalgias, arthralgias and gait problem.  Skin: Negative for color change and rash.  Neurological: Negative for dizziness and headaches.  Hematological: Negative for adenopathy. Does not bruise/bleed easily.  Psychiatric/Behavioral: Negative for suicidal ideas, sleep disturbance and dysphoric mood. The patient is not nervous/anxious.        Objective:   Physical Exam  Constitutional: She is oriented to person, place, and time. She appears well-developed and well-nourished. No distress.  HENT:  Head: Normocephalic and atraumatic.  Right Ear: External ear normal.  Left Ear: External ear normal.  Nose: Nose normal.  Mouth/Throat: Oropharynx is clear and moist. No oropharyngeal exudate.  Eyes: Conjunctivae normal are normal. Pupils are equal, round, and reactive to light. Right eye exhibits no discharge. Left eye exhibits no discharge. No scleral icterus.  Neck: Normal range of motion. Neck supple. No tracheal deviation present. No thyromegaly present.  Cardiovascular: Normal rate, regular rhythm, normal heart sounds and intact distal pulses.  Exam reveals no gallop and no friction rub.   No murmur heard. Pulmonary/Chest:  Effort normal and breath sounds normal. No respiratory distress. She has no wheezes. She has no rales. She exhibits no tenderness.  Musculoskeletal: Normal range of motion. She exhibits no edema and no  tenderness.  Lymphadenopathy:    She has no cervical adenopathy.  Neurological: She is alert and oriented to person, place, and time. No cranial nerve deficit. She exhibits normal muscle tone. Coordination normal.  Skin: Skin is warm and dry. No rash noted. She is not diaphoretic. No erythema. No pallor.  Psychiatric: She has a normal mood and affect. Her behavior is normal. Judgment and thought content normal.          Assessment & Plan:

## 2012-03-07 ENCOUNTER — Ambulatory Visit: Payer: PRIVATE HEALTH INSURANCE | Admitting: Internal Medicine

## 2012-03-12 ENCOUNTER — Encounter: Payer: Self-pay | Admitting: Cardiovascular Disease

## 2012-03-12 ENCOUNTER — Ambulatory Visit (INDEPENDENT_AMBULATORY_CARE_PROVIDER_SITE_OTHER): Payer: PRIVATE HEALTH INSURANCE | Admitting: Cardiovascular Disease

## 2012-03-12 ENCOUNTER — Ambulatory Visit: Payer: PRIVATE HEALTH INSURANCE | Admitting: Cardiovascular Disease

## 2012-03-12 VITALS — BP 128/74 | HR 68 | Ht 64.0 in | Wt 121.2 lb

## 2012-03-12 DIAGNOSIS — R011 Cardiac murmur, unspecified: Secondary | ICD-10-CM

## 2012-03-12 DIAGNOSIS — R002 Palpitations: Secondary | ICD-10-CM

## 2012-03-12 NOTE — Patient Instructions (Addendum)
Your physician has requested that you have an echocardiogram. Echocardiography is a painless test that uses sound waves to create images of your heart. It provides your doctor with information about the size and shape of your heart and how well your heart's chambers and valves are working. This procedure takes approximately one hour. There are no restrictions for this procedure.  Your physician has recommended that you wear a holter monitor. Holter monitors are medical devices that record the heart's electrical activity. Doctors most often use these monitors to diagnose arrhythmias. Arrhythmias are problems with the speed or rhythm of the heartbeat. The monitor is a small, portable device. You can wear one while you do your normal daily activities. This is usually used to diagnose what is causing palpitations/syncope (passing out).   

## 2012-03-16 DIAGNOSIS — R011 Cardiac murmur, unspecified: Secondary | ICD-10-CM | POA: Insufficient documentation

## 2012-03-16 NOTE — Progress Notes (Signed)
HPI  This is a pleasant 46 year old female who was referred by Dr. Dan Humphreys for evaluation of palpitations and an abnormal ECG. The patient was told as a child about having a cardiac murmur which resolved in late teenage. She has no chronic medical conditions such as diabetes, hypertension or hyperlipidemia. She recently started complaining of increased palpitations. This was most obvious after she was given prednisone for sinus infection. She had skipping in her heart with occasional mild tachycardia. There has been no syncope or presyncope. She denies any chest pain or dyspnea. She also complains of fatigue and poor quality sleep. She will be undergoing a sleep study. She denies excessive alcohol or caffeine intake. She had recent labs performed which were normal including thyroid function. She has no family history of premature coronary artery disease, arrhythmia or sudden cardiac death.  Allergies  Allergen Reactions  . Erythromycin Diarrhea  . Prednisone Palpitations     Current Outpatient Prescriptions on File Prior to Visit  Medication Sig Dispense Refill  . Cholecalciferol (VITAMIN D) 2000 UNITS CAPS Take by mouth daily.      . fluticasone (FLONASE) 50 MCG/ACT nasal spray Place 2 sprays into the nose daily.      Marland Kitchen levonorgestrel-ethinyl estradiol (NORDETTE) 0.15-30 MG-MCG tablet Take 1 tablet by mouth daily.         Past Medical History  Diagnosis Date  . Allergy   . Sinus infection   . Heart murmur      Past Surgical History  Procedure Date  . Vaginal delivery     2  . Wisdom tooth extraction      Family History  Problem Relation Age of Onset  . Hypertension Mother   . Hyperlipidemia Mother     triglycerides  . Cancer Father     bile duct  . Heart disease Father 82     History   Social History  . Marital Status: Married    Spouse Name: N/A    Number of Children: N/A  . Years of Education: N/A   Occupational History  . Not on file.   Social History  Main Topics  . Smoking status: Never Smoker   . Smokeless tobacco: Never Used  . Alcohol Use: No     Comment: rare  . Drug Use: No  . Sexually Active: Not on file   Other Topics Concern  . Not on file   Social History Narrative   Lives in [place], [place] with husband and two children (16YO, 11YO) and dog.Work - Chemical engineer - relative healthyExercise - walk or elliptical 5 days per week     ROS Constitutional: Negative for fever, chills, diaphoresis, activity change, appetite change and fatigue.  HENT: Negative for hearing loss, nosebleeds, congestion, sore throat, facial swelling, drooling, trouble swallowing, neck pain, voice change, sinus pressure and tinnitus.  Eyes: Negative for photophobia, pain, discharge and visual disturbance.  Respiratory: Negative for apnea, cough, chest tightness, shortness of breath and wheezing.  Cardiovascular: Negative for chest pain and leg swelling.  Gastrointestinal: Negative for nausea, vomiting, abdominal pain, diarrhea, constipation, blood in stool and abdominal distention.  Genitourinary: Negative for dysuria, urgency, frequency, hematuria and decreased urine volume.  Musculoskeletal: Negative for myalgias, back pain, joint swelling, arthralgias and gait problem.  Skin: Negative for color change, pallor, rash and wound.  Neurological: Negative for dizziness, tremors, seizures, syncope, speech difficulty, weakness, light-headedness, numbness and headaches.  Psychiatric/Behavioral: Negative for suicidal ideas, hallucinations, behavioral problems and agitation. The patient  is  nervous/anxious.     PHYSICAL EXAM   BP 128/74  Pulse 68  Ht 5\' 4"  (1.626 m)  Wt 121 lb 4 oz (54.999 kg)  BMI 20.81 kg/m2  LMP 02/28/2012 Constitutional: She is oriented to person, place, and time. She appears well-developed and well-nourished. No distress.  HENT: No nasal discharge.  Head: Normocephalic and atraumatic.  Eyes: Pupils are equal and round.  Right eye exhibits no discharge. Left eye exhibits no discharge.  Neck: Normal range of motion. Neck supple. No JVD present. No thyromegaly present.  Cardiovascular: Normal rate, regular rhythm, normal heart sounds. Exam reveals no gallop and no friction rub. There is a 2/6 systolic ejection murmur at the aortic area which is early peaking.  Pulmonary/Chest: Effort normal and breath sounds normal. No stridor. No respiratory distress. She has no wheezes. She has no rales. She exhibits no tenderness.  Abdominal: Soft. Bowel sounds are normal. She exhibits no distension. There is no tenderness. There is no rebound and no guarding.  Musculoskeletal: Normal range of motion. She exhibits no edema and no tenderness.  Neurological: She is alert and oriented to person, place, and time. Coordination normal.  Skin: Skin is warm and dry. No rash noted. She is not diaphoretic. No erythema. No pallor.  Psychiatric: She has a normal mood and affect. Her behavior is normal. Judgment and thought content normal.     EKG: Recent ECG was reviewed which showed normal sinus rhythm with borderline short PR interval but no obvious delta waves. QT interval was normal.   ASSESSMENT AND PLAN

## 2012-03-16 NOTE — Assessment & Plan Note (Signed)
The patient has a systolic murmur in the aortic area. Although this could be functional flow murmur, it seems to be more prominent. Thus, I will obtain an echocardiogram for evaluation.

## 2012-03-16 NOTE — Assessment & Plan Note (Signed)
Her palpitations seem to be suggestive of premature beats. I don't suspect malignant arrhythmia. Her ECG is slightly abnormal with slightly short PR interval but no obvious delta waves. I do not think she had WPW pattern. I recommend a 48-hour Holter monitor for evaluation. I suspect that there is an anxiety component. Recent worsening was likely due to steroids.

## 2012-03-19 ENCOUNTER — Other Ambulatory Visit (INDEPENDENT_AMBULATORY_CARE_PROVIDER_SITE_OTHER): Payer: PRIVATE HEALTH INSURANCE

## 2012-03-19 ENCOUNTER — Other Ambulatory Visit: Payer: Self-pay

## 2012-03-19 DIAGNOSIS — R002 Palpitations: Secondary | ICD-10-CM

## 2012-03-19 DIAGNOSIS — R011 Cardiac murmur, unspecified: Secondary | ICD-10-CM

## 2012-03-26 ENCOUNTER — Ambulatory Visit (INDEPENDENT_AMBULATORY_CARE_PROVIDER_SITE_OTHER): Payer: PRIVATE HEALTH INSURANCE | Admitting: Cardiovascular Disease

## 2012-03-26 ENCOUNTER — Encounter: Payer: Self-pay | Admitting: Cardiovascular Disease

## 2012-03-26 VITALS — BP 132/76 | HR 76 | Ht 64.0 in | Wt 121.0 lb

## 2012-03-26 DIAGNOSIS — I4949 Other premature depolarization: Secondary | ICD-10-CM

## 2012-03-26 DIAGNOSIS — R011 Cardiac murmur, unspecified: Secondary | ICD-10-CM

## 2012-03-26 DIAGNOSIS — R002 Palpitations: Secondary | ICD-10-CM

## 2012-03-26 NOTE — Assessment & Plan Note (Signed)
This seems to be a physiologic murmur. Echocardiogram showed no significant valvular abnormalities. No evidence of mitral valve prolapse.   

## 2012-03-26 NOTE — Progress Notes (Signed)
HPI  This is a pleasant 46 year old female who is here today for a followup visit regarding  palpitations and an abnormal ECG. The patient was told as a child about having a cardiac murmur which resolved in late teenage. She has no chronic medical conditions such as diabetes, hypertension or hyperlipidemia. She recently started complaining of increased palpitations. This was most obvious after she was given prednisone for sinus infection. She had skipping in her heart with occasional mild tachycardia. There has been no syncope or presyncope. No chest pain or dyspnea. No excessive alcohol or caffeine intake. She had recent labs performed which were normal including thyroid function. She has no family history of premature coronary artery disease, arrhythmia or sudden cardiac death. She underwent cardiac evaluation which included an echocardiogram which basically was normal as well as a 48-hour Holter monitor which showed only 2 PVCs and no other cardiac arrhythmia.  Allergies  Allergen Reactions  . Erythromycin Diarrhea  . Prednisone Palpitations     Current Outpatient Prescriptions on File Prior to Visit  Medication Sig Dispense Refill  . Cholecalciferol (VITAMIN D) 2000 UNITS CAPS Take by mouth daily.      . fluticasone (FLONASE) 50 MCG/ACT nasal spray Place 2 sprays into the nose daily.      Marland Kitchen levonorgestrel-ethinyl estradiol (NORDETTE) 0.15-30 MG-MCG tablet Take 1 tablet by mouth daily.         Past Medical History  Diagnosis Date  . Allergy   . Sinus infection   . Heart murmur      Past Surgical History  Procedure Date  . Vaginal delivery     2  . Wisdom tooth extraction      Family History  Problem Relation Age of Onset  . Hypertension Mother   . Hyperlipidemia Mother     triglycerides  . Cancer Father     bile duct  . Heart disease Father 3     History   Social History  . Marital Status: Married    Spouse Name: N/A    Number of Children: N/A  . Years  of Education: N/A   Occupational History  . Not on file.   Social History Main Topics  . Smoking status: Never Smoker   . Smokeless tobacco: Never Used  . Alcohol Use: No     Comment: rare  . Drug Use: No  . Sexually Active: Not on file   Other Topics Concern  . Not on file   Social History Narrative   Lives in [place], [place] with husband and two children (16YO, 11YO) and dog.Work - Chemical engineer - relative healthyExercise - walk or elliptical 5 days per week        PHYSICAL EXAM   BP 132/76  Pulse 76  Ht 5\' 4"  (1.626 m)  Wt 121 lb (54.885 kg)  BMI 20.77 kg/m2  LMP 02/28/2012 Constitutional: She is oriented to person, place, and time. She appears well-developed and well-nourished. No distress.  HENT: No nasal discharge.  Head: Normocephalic and atraumatic.  Eyes: Pupils are equal and round. Right eye exhibits no discharge. Left eye exhibits no discharge.  Neck: Normal range of motion. Neck supple. No JVD present. No thyromegaly present.  Cardiovascular: Normal rate, regular rhythm, normal heart sounds. Exam reveals no gallop and no friction rub. There is a 1/6 systolic ejection murmur at the aortic area which is early peaking.  Pulmonary/Chest: Effort normal and breath sounds normal. No stridor. No respiratory distress. She has no  wheezes. She has no rales. She exhibits no tenderness.  Abdominal: Soft. Bowel sounds are normal. She exhibits no distension. There is no tenderness. There is no rebound and no guarding.  Musculoskeletal: Normal range of motion. She exhibits no edema and no tenderness.  Neurological: She is alert and oriented to person, place, and time. Coordination normal.  Skin: Skin is warm and dry. No rash noted. She is not diaphoretic. No erythema. No pallor.  Psychiatric: She has a normal mood and affect. Her behavior is normal. Judgment and thought content normal.      ASSESSMENT AND PLAN

## 2012-03-26 NOTE — Assessment & Plan Note (Signed)
This was likely do to PVCs in the setting of treatment with steroids. Her symptoms are very mild. Her Holter monitor showed only 2 PVCs. No evidence of other arrhythmia. No further cardiac evaluation is needed at this time. Followup as needed.

## 2012-03-26 NOTE — Patient Instructions (Addendum)
Your echo was normal.  Holter monitor showed only few PVCs.  Follow up as needed.

## 2012-04-05 ENCOUNTER — Ambulatory Visit (INDEPENDENT_AMBULATORY_CARE_PROVIDER_SITE_OTHER): Payer: PRIVATE HEALTH INSURANCE | Admitting: Internal Medicine

## 2012-04-05 ENCOUNTER — Encounter: Payer: Self-pay | Admitting: Internal Medicine

## 2012-04-05 VITALS — BP 126/80 | HR 78 | Temp 98.4°F | Resp 16 | Wt 121.2 lb

## 2012-04-05 DIAGNOSIS — Z23 Encounter for immunization: Secondary | ICD-10-CM

## 2012-04-05 DIAGNOSIS — Z1331 Encounter for screening for depression: Secondary | ICD-10-CM

## 2012-04-05 DIAGNOSIS — R002 Palpitations: Secondary | ICD-10-CM

## 2012-04-05 NOTE — Progress Notes (Signed)
  Subjective:    Patient ID: Erika Daugherty, female    DOB: December 06, 1965, 46 y.o.   MRN: [id number]  HPI 46 year old female presents for followup after recent episodes of palpitations. She was evaluated by cardiology and had Holter monitor which was normal except for 2 PVCs. She also had cardiac echo which showed normal LV function and mild mitral regurgitation. She reports she is generally been feeling well with less episodes of palpitations over the last few weeks. She denies any new concerns today.  Outpatient Encounter Prescriptions as of 04/05/2012  Medication Sig Dispense Refill  . Cholecalciferol (VITAMIN D) 2000 UNITS CAPS Take by mouth daily.      . fluticasone (FLONASE) 50 MCG/ACT nasal spray Place 2 sprays into the nose daily.      Marland Kitchen levonorgestrel-ethinyl estradiol (NORDETTE) 0.15-30 MG-MCG tablet Take 1 tablet by mouth daily.      Marland Kitchen omeprazole (PRILOSEC) 20 MG capsule Take 20 mg by mouth daily.       BP 126/80  Pulse 78  Temp 98.4 F (36.9 C) (Oral)  Resp 16  Wt 121 lb 4 oz (54.999 kg)  LMP 03/29/2012  Review of Systems  Constitutional: Negative for fever, chills, appetite change, fatigue and unexpected weight change.  HENT: Negative for ear pain, congestion, sore throat, trouble swallowing, neck pain, voice change and sinus pressure.   Eyes: Negative for visual disturbance.  Respiratory: Negative for cough, shortness of breath, wheezing and stridor.   Cardiovascular: Negative for chest pain, palpitations and leg swelling.  Gastrointestinal: Negative for nausea, vomiting, abdominal pain, diarrhea, constipation, blood in stool, abdominal distention and anal bleeding.  Genitourinary: Negative for dysuria and flank pain.  Musculoskeletal: Negative for myalgias, arthralgias and gait problem.  Skin: Negative for color change and rash.  Neurological: Negative for dizziness and headaches.  Hematological: Negative for adenopathy. Does not bruise/bleed easily.   Psychiatric/Behavioral: Negative for suicidal ideas, sleep disturbance and dysphoric mood. The patient is not nervous/anxious.        Objective:   Physical Exam  Constitutional: She is oriented to person, place, and time. She appears well-developed and well-nourished. No distress.  HENT:  Head: Normocephalic and atraumatic.  Right Ear: External ear normal.  Left Ear: External ear normal.  Nose: Nose normal.  Mouth/Throat: Oropharynx is clear and moist. No oropharyngeal exudate.  Eyes: Conjunctivae normal are normal. Pupils are equal, round, and reactive to light. Right eye exhibits no discharge. Left eye exhibits no discharge. No scleral icterus.  Neck: Normal range of motion. Neck supple. No tracheal deviation present. No thyromegaly present.  Cardiovascular: Normal rate, regular rhythm, normal heart sounds and intact distal pulses.  Exam reveals no gallop and no friction rub.   No murmur heard. Pulmonary/Chest: Effort normal and breath sounds normal. No respiratory distress. She has no wheezes. She has no rales. She exhibits no tenderness.  Musculoskeletal: Normal range of motion. She exhibits no edema and no tenderness.  Lymphadenopathy:    She has no cervical adenopathy.  Neurological: She is alert and oriented to person, place, and time. No cranial nerve deficit. She exhibits normal muscle tone. Coordination normal.  Skin: Skin is warm and dry. No rash noted. She is not diaphoretic. No erythema. No pallor.  Psychiatric: She has a normal mood and affect. Her behavior is normal. Judgment and thought content normal.          Assessment & Plan:

## 2012-04-05 NOTE — Assessment & Plan Note (Signed)
Symptoms of palpitations have improved. Her recent cardiac evaluation including Holter monitor and echo were normal. Will continue to monitor.

## 2012-10-11 ENCOUNTER — Ambulatory Visit: Payer: PRIVATE HEALTH INSURANCE | Admitting: Internal Medicine

## 2012-10-17 ENCOUNTER — Encounter: Payer: Self-pay | Admitting: Internal Medicine

## 2012-10-17 ENCOUNTER — Ambulatory Visit (INDEPENDENT_AMBULATORY_CARE_PROVIDER_SITE_OTHER): Payer: Managed Care, Other (non HMO) | Admitting: Internal Medicine

## 2012-10-17 VITALS — BP 134/74 | HR 67 | Temp 98.2°F | Ht 64.0 in | Wt 123.0 lb

## 2012-10-17 DIAGNOSIS — D239 Other benign neoplasm of skin, unspecified: Secondary | ICD-10-CM

## 2012-10-17 DIAGNOSIS — D229 Melanocytic nevi, unspecified: Secondary | ICD-10-CM | POA: Insufficient documentation

## 2012-10-17 DIAGNOSIS — R51 Headache: Secondary | ICD-10-CM

## 2012-10-17 DIAGNOSIS — Z0000 Encounter for general adult medical examination without abnormal findings: Secondary | ICD-10-CM

## 2012-10-17 MED ORDER — FLUTICASONE PROPIONATE 50 MCG/ACT NA SUSP: 2.0000 | Freq: Every day | NASAL | Status: DC

## 2012-10-17 NOTE — Assessment & Plan Note (Signed)
Atypical nevi noted on legs and trunk. Will set up dermatology evaluation.

## 2012-10-17 NOTE — Progress Notes (Signed)
Subjective:    Patient ID: Erika Daugherty, female    DOB: 1965-10-01, 47 y.o.   MRN: [id number]  HPI 47 year old female with history of allergic rhinitis presents for annual exam. She reports she is generally feeling well. She notes that during times of stress she has intermittent left-sided facial pain. We have evaluated her for this last year with evaluation including ESR to screen for temporal arteritis. This was normal. She was then seen by ENT physician who performed CT of the head which was normal. Symptoms have been mild and very intermittent over the last year until a few weeks ago when she underwent a period of increased stress and for 2 weeks had fairly consistent left-sided facial pain. It is described as aching or burning which improves with massage located in the temporal area. There are no visual changes. There are no overlying skin changes. She has not had any fever or chills. She has not had headache. She has not taken medication for pain.  In regards to health maintenance, she reports breast and pelvic exam recently normal with her OB/GYN. Mammogram is overdue. Immunizations are up-to-date.  Outpatient Encounter Prescriptions as of 10/17/2012  Medication Sig Dispense Refill  . fluticasone (FLONASE) 50 MCG/ACT nasal spray Place 2 sprays into the nose daily.  16 g  11  . levonorgestrel-ethinyl estradiol (NORDETTE) 0.15-30 MG-MCG tablet Take 1 tablet by mouth daily.      Marland Kitchen omeprazole (PRILOSEC) 20 MG capsule Take 20 mg by mouth daily.      . [DISCONTINUED] fluticasone (FLONASE) 50 MCG/ACT nasal spray Place 2 sprays into the nose daily.      . Cholecalciferol (VITAMIN D) 2000 UNITS CAPS Take by mouth daily.       No facility-administered encounter medications on file as of 10/17/2012.   BP 134/74  Pulse 67  Temp(Src) 98.2 F (36.8 C) (Oral)  Ht 5\' 4"  (1.626 m)  Wt 123 lb (55.792 kg)  BMI 21.1 kg/m2  SpO2 99%  LMP 10/10/2012  Review of Systems  Constitutional: Negative for  fever, chills, appetite change, fatigue and unexpected weight change.  HENT: Negative for ear pain, congestion, sore throat, trouble swallowing, neck pain, voice change and sinus pressure.   Eyes: Negative for visual disturbance.  Respiratory: Negative for cough, shortness of breath, wheezing and stridor.   Cardiovascular: Negative for chest pain, palpitations and leg swelling.  Gastrointestinal: Negative for nausea, vomiting, abdominal pain, diarrhea, constipation, blood in stool, abdominal distention and anal bleeding.  Genitourinary: Negative for dysuria and flank pain.  Musculoskeletal: Negative for myalgias, arthralgias and gait problem.  Skin: Negative for color change and rash.  Neurological: Negative for dizziness and headaches.  Hematological: Negative for adenopathy. Does not bruise/bleed easily.  Psychiatric/Behavioral: Negative for suicidal ideas, sleep disturbance and dysphoric mood. The patient is not nervous/anxious.        Objective:   Physical Exam  Constitutional: She is oriented to person, place, and time. She appears well-developed and well-nourished. No distress.  HENT:  Head: Normocephalic and atraumatic.    Right Ear: Tympanic membrane, external ear and ear canal normal.  Left Ear: Tympanic membrane, external ear and ear canal normal.  Nose: Nose normal.  Mouth/Throat: Oropharynx is clear and moist. No oropharyngeal exudate.  Eyes: Conjunctivae are normal. Pupils are equal, round, and reactive to light. Right eye exhibits no discharge. Left eye exhibits no discharge. No scleral icterus.  Neck: Normal range of motion. Neck supple. No tracheal deviation present. No thyromegaly present.  Cardiovascular:  Normal rate, regular rhythm, normal heart sounds and intact distal pulses.  Exam reveals no gallop and no friction rub.   No murmur heard. Pulmonary/Chest: Effort normal and breath sounds normal. No accessory muscle usage. Not tachypneic. No respiratory distress. She  has no decreased breath sounds. She has no wheezes. She has no rhonchi. She has no rales. She exhibits no tenderness.  Abdominal: Soft. Bowel sounds are normal. She exhibits no distension. There is no tenderness.  Musculoskeletal: Normal range of motion. She exhibits no edema and no tenderness.  Lymphadenopathy:    She has no cervical adenopathy.  Neurological: She is alert and oriented to person, place, and time. No cranial nerve deficit. She exhibits normal muscle tone. Coordination normal.  Skin: Skin is warm and dry. No rash noted. She is not diaphoretic. No erythema. No pallor.  Psychiatric: She has a normal mood and affect. Her behavior is normal. Judgment and thought content normal.          Assessment & Plan:

## 2012-10-17 NOTE — Assessment & Plan Note (Addendum)
Intermittent episodes of left sided facial pain. Symptoms are improved with massage. Most consistent with muscular spasm related to tension and anxiety. Discussed potentially starting muscle relaxer. Patient would like to hold off for now. Discussed alternative etiologies including temporal arteritis and trigeminal neuralgia. Last year evaluation including evaluation for temporal arteritis with ESR was normal. Patient also had evaluation with ENT with CT of the head which was normal. Offered reassurance today. Will request notes from ENT physician. Also discussed potential MRI brain if symptoms persistent or worsening.

## 2012-10-17 NOTE — Assessment & Plan Note (Signed)
General medical exam normal today. Breast and pelvic exam deferred as these were recently completed by her OB/GYN. Mammogram ordered. Referral for dermatology for skin cancer screening data. Will check labs including CBC, CMP, lipid profile, vitamin D, TSH. Encouraged continued healthy lifestyle with healthy diet and regular physical activity. Followup in one year or sooner as needed.

## 2012-10-18 ENCOUNTER — Encounter: Payer: Self-pay | Admitting: Emergency Medicine

## 2012-10-18 LAB — SEDIMENTATION RATE: Sed Rate: 13 mm/hr (ref 0–22)

## 2012-10-18 LAB — COMPREHENSIVE METABOLIC PANEL
Alkaline Phosphatase: 58 U/L (ref 39–117)
BUN: 14 mg/dL (ref 6–23)
CO2: 27 mEq/L (ref 19–32)
Creatinine, Ser: 0.8 mg/dL (ref 0.4–1.2)
GFR: 87.36 mL/min (ref >60.00)
Glucose, Bld: 90 mg/dL (ref 70–99)
Sodium: 141 mEq/L (ref 135–145)
Total Bilirubin: 1.1 mg/dL (ref 0.3–1.2)
Total Protein: 8.6 g/dL — ABNORMAL HIGH (ref 6.0–8.3)

## 2012-10-18 LAB — LIPID PANEL
Cholesterol: 163 mg/dL (ref 0–200)
HDL: 55.1 mg/dL (ref >39.00)
LDL Cholesterol: 78 mg/dL (ref 0–99)
Triglycerides: 152 mg/dL — ABNORMAL HIGH (ref 0.0–149.0)
VLDL: 30.4 mg/dL (ref 0.0–40.0)

## 2012-10-19 ENCOUNTER — Telehealth: Payer: Self-pay | Admitting: *Deleted

## 2012-10-19 NOTE — Telephone Encounter (Signed)
[facility] called they said they would like a re-collect for the CBC, tried called pt didn't get an answer

## 2012-10-19 NOTE — Telephone Encounter (Signed)
FYI to Dr. Walker 

## 2012-11-22 ENCOUNTER — Encounter: Payer: Self-pay | Admitting: Internal Medicine

## 2012-11-27 LAB — HM MAMMOGRAPHY: HM Mammogram: NORMAL

## 2012-11-29 ENCOUNTER — Encounter: Payer: Self-pay | Admitting: Internal Medicine

## 2012-12-17 ENCOUNTER — Encounter: Payer: Self-pay | Admitting: Internal Medicine

## 2013-03-05 ENCOUNTER — Encounter: Payer: Self-pay | Admitting: Adult Health

## 2013-03-05 ENCOUNTER — Ambulatory Visit (INDEPENDENT_AMBULATORY_CARE_PROVIDER_SITE_OTHER): Payer: Managed Care, Other (non HMO) | Admitting: Adult Health

## 2013-03-05 VITALS — BP 142/80 | HR 72 | Temp 98.2°F | Resp 12 | Wt 125.5 lb

## 2013-03-05 DIAGNOSIS — J029 Acute pharyngitis, unspecified: Secondary | ICD-10-CM

## 2013-03-05 LAB — POCT RAPID STREP A (OFFICE): Rapid Strep A Screen: NEGATIVE

## 2013-03-05 NOTE — Assessment & Plan Note (Signed)
Negative for strep. Supportive care for symptom management. Fluids. Chloraseptic spray or lozenges. Gargle with salt water solution. Ibuprofen for pain. Continue saline spray and flonase. If secretions become green colored or if she develops a fever - pt is to return to clinic.

## 2013-03-05 NOTE — Patient Instructions (Signed)
Sore Throat A sore throat is pain, burning, irritation, or scratchiness of the throat. There is often pain or tenderness when swallowing or talking. A sore throat may be accompanied by other symptoms, such as coughing, sneezing, fever, and swollen neck glands. A sore throat is often the first sign of another sickness, such as a cold, flu, strep throat, or mononucleosis (commonly known as mono). Most sore throats go away without medical treatment. CAUSES  The most common causes of a sore throat include:  A viral infection, such as a cold, flu, or mono.  A bacterial infection, such as strep throat, tonsillitis, or whooping cough.  Seasonal allergies.  Dryness in the air.  Irritants, such as smoke or pollution.  Gastroesophageal reflux disease (GERD). HOME CARE INSTRUCTIONS   Only take over-the-counter medicines as directed by your caregiver.  Drink enough fluids to keep your urine clear or pale yellow.  Rest as needed.  Try using throat sprays, lozenges, or sucking on hard candy to ease any pain (if older than 4 years or as directed).  Sip warm liquids, such as broth, herbal tea, or warm water with honey to relieve pain temporarily. You may also eat or drink cold or frozen liquids such as frozen ice pops.  Gargle with salt water (mix 1 tsp salt with 8 oz of water).  Do not smoke and avoid secondhand smoke.  Put a cool-mist humidifier in your bedroom at night to moisten the air. You can also turn on a hot shower and sit in the bathroom with the door closed for 5 10 minutes. SEEK IMMEDIATE MEDICAL CARE IF:  You have difficulty breathing.  You are unable to swallow fluids, soft foods, or your saliva.  You have increased swelling in the throat.  Your sore throat does not get better in 7 days.  You have nausea and vomiting.  You have a fever or persistent symptoms for more than 2 3 days.  You have a fever and your symptoms suddenly get worse. MAKE SURE YOU:   Understand  these instructions.  Will watch your condition.  Will get help right away if you are not doing well or get worse. Document Released: 05/26/2004 Document Revised: 04/04/2012 Document Reviewed: 12/25/2011 [employer] Patient Information 2014 [employer].  

## 2013-03-05 NOTE — Progress Notes (Signed)
  Subjective:    Patient ID: Erika Daugherty, female    DOB: [birth year], 47 y.o.   MRN: [id number]  HPI  Pt reports sore throat x 2 days. Denies fever or chills. Pt took Ibuprofen on Sunday which helped throat pain but has not taken since. Pt reports cough this morning with clear phlegm. Pt reports headache on Sunday but denies any headache or sinus pressure since.    Current Outpatient Prescriptions on File Prior to Visit  Medication Sig Dispense Refill  . fluticasone (FLONASE) 50 MCG/ACT nasal spray Place 2 sprays into the nose daily.  16 g  11  . levonorgestrel-ethinyl estradiol (NORDETTE) 0.15-30 MG-MCG tablet Take 1 tablet by mouth daily.      Marland Kitchen omeprazole (PRILOSEC) 20 MG capsule Take 20 mg by mouth daily as needed.        No current facility-administered medications on file prior to visit.      Review of Systems  Constitutional: Negative for fever and chills.  HENT: Positive for postnasal drip and sore throat. Negative for ear pain, trouble swallowing and voice change.   Respiratory: Positive for cough. Negative for shortness of breath and wheezing.        Cough this am with clear phlegm  Neurological: Positive for headaches.       Headache 2 days ago, denies at this time.       Objective:   Physical Exam  Constitutional: She is oriented to person, place, and time. She appears well-developed and well-nourished.  HENT:  Head: Normocephalic and atraumatic.  Right Ear: External ear normal.  Left Ear: External ear normal.  Mouth/Throat: Mucous membranes are normal. Posterior oropharyngeal erythema present. No oropharyngeal exudate or posterior oropharyngeal edema.  Neck: Normal range of motion. Neck supple. No thyromegaly present.  Cardiovascular: Normal rate and regular rhythm.   Lymphadenopathy:    She has no cervical adenopathy.  Neurological: She is alert and oriented to person, place, and time.  Skin: Skin is warm and dry.  Psychiatric: She has a normal mood and  affect. Her behavior is normal. Judgment and thought content normal.    BP 142/80  Pulse 72  Temp(Src) 98.2 F (36.8 C) (Oral)  Resp 12  Wt 125 lb 8 oz (56.926 kg)  SpO2 98%       Assessment & Plan:

## 2013-03-05 NOTE — Progress Notes (Signed)
Pre-visit discussion using our clinic review tool. No additional management support is needed unless otherwise documented below in the visit note.  

## 2013-03-07 ENCOUNTER — Other Ambulatory Visit: Payer: Self-pay

## 2013-03-15 ENCOUNTER — Telehealth: Payer: Self-pay | Admitting: *Deleted

## 2013-03-15 NOTE — Telephone Encounter (Signed)
Spoke w/ pt.  She reports increased palpitations over the past 2 weeks.   Reports that she "freaks out" when they happen, which seems to make them worse.  Pt sched to see Dr. Kirke Corin 03/18/13 @ 3:30.

## 2013-03-15 NOTE — Telephone Encounter (Signed)
Pt called today and mentioned that she saw Dr. Kirke Corin last yr 03/2012. Pt was told to follow up as needed. Pt mentioned that she has been having more frequent palpitations, Currently not taking any medications other that the medications listed on med list. Please advise.

## 2013-03-18 ENCOUNTER — Ambulatory Visit: Payer: Managed Care, Other (non HMO) | Admitting: Cardiovascular Disease

## 2013-03-18 ENCOUNTER — Ambulatory Visit (INDEPENDENT_AMBULATORY_CARE_PROVIDER_SITE_OTHER): Payer: Managed Care, Other (non HMO) | Admitting: Cardiovascular Disease

## 2013-03-18 ENCOUNTER — Encounter: Payer: Self-pay | Admitting: Cardiovascular Disease

## 2013-03-18 VITALS — BP 142/82 | HR 87 | Ht 64.0 in | Wt 128.5 lb

## 2013-03-18 DIAGNOSIS — R011 Cardiac murmur, unspecified: Secondary | ICD-10-CM

## 2013-03-18 DIAGNOSIS — R002 Palpitations: Secondary | ICD-10-CM

## 2013-03-18 NOTE — Assessment & Plan Note (Signed)
This seems to be a physiologic murmur. Echocardiogram showed no significant valvular abnormalities. No evidence of mitral valve prolapse.

## 2013-03-18 NOTE — Patient Instructions (Signed)
Follow up as needed

## 2013-03-18 NOTE — Progress Notes (Signed)
HPI  This is a pleasant 47 year old female who is here today for a followup visit regarding  palpitations . The patient was told as a child about having a cardiac murmur which resolved in late teenage. She has no chronic medical conditions such as diabetes, hypertension or hyperlipidemia. She was seen last year for palpitations thought to be due to premature beats with no other cardiac symptoms. She underwent cardiac evaluation which included an echocardiogram which basically was normal as well as a 48-hour Holter monitor which showed only 2 PVCs and no other cardiac arrhythmia. She had worsening palpitations over the last few weeks with no associated tachycardia, dizziness or syncope. Whenever she feels a skipped beat, she becomes extremely anxious.  Allergies  Allergen Reactions  . Erythromycin Diarrhea  . Prednisone Palpitations     Current Outpatient Prescriptions on File Prior to Visit  Medication Sig Dispense Refill  . fluticasone (FLONASE) 50 MCG/ACT nasal spray Place 2 sprays into the nose daily.  16 g  11  . levonorgestrel-ethinyl estradiol (NORDETTE) 0.15-30 MG-MCG tablet Take 1 tablet by mouth daily.      Marland Kitchen omeprazole (PRILOSEC) 20 MG capsule Take 20 mg by mouth daily as needed.        No current facility-administered medications on file prior to visit.     Past Medical History  Diagnosis Date  . Allergy   . Sinus infection   . Heart murmur      Past Surgical History  Procedure Laterality Date  . Vaginal delivery      2  . Wisdom tooth extraction       Family History  Problem Relation Age of Onset  . Hypertension Mother   . Hyperlipidemia Mother     triglycerides  . Cancer Father     bile duct  . Heart disease Father 45     History   Social History  . Marital Status: Married    Spouse Name: N/A    Number of Children: N/A  . Years of Education: N/A   Occupational History  . Not on file.   Social History Main Topics  . Smoking status: Never  Smoker   . Smokeless tobacco: Never Used  . Alcohol Use: No     Comment: rare  . Drug Use: No  . Sexual Activity: Not on file   Other Topics Concern  . Not on file   Social History Narrative   Lives in [place], [place] with husband and two children (16YO, 11YO) and dog.      Work - Tax inspector   Diet - relative healthy   Exercise - walk or elliptical 5 days per week        PHYSICAL EXAM   BP 142/82  Pulse 87  Ht 5\' 4"  (1.626 m)  Wt 128 lb 8 oz (58.287 kg)  BMI 22.05 kg/m2 Constitutional: She is oriented to person, place, and time. She appears well-developed and well-nourished. No distress.  HENT: No nasal discharge.  Head: Normocephalic and atraumatic.  Eyes: Pupils are equal and round. Right eye exhibits no discharge. Left eye exhibits no discharge.  Neck: Normal range of motion. Neck supple. No JVD present. No thyromegaly present.  Cardiovascular: Normal rate, regular rhythm, normal heart sounds. Exam reveals no gallop and no friction rub. There is a 1/6 systolic ejection murmur at the aortic area which is early peaking.  Pulmonary/Chest: Effort normal and breath sounds normal. No stridor. No respiratory distress. She has no wheezes.  She has no rales. She exhibits no tenderness.  Abdominal: Soft. Bowel sounds are normal. She exhibits no distension. There is no tenderness. There is no rebound and no guarding.  Musculoskeletal: Normal range of motion. She exhibits no edema and no tenderness.  Neurological: She is alert and oriented to person, place, and time. Coordination normal.  Skin: Skin is warm and dry. No rash noted. She is not diaphoretic. No erythema. No pallor.  Psychiatric: She has a normal mood and affect. Her behavior is normal. Judgment and thought content normal.   QMV:HQION  Rhythm  -Nonspecific ST changes  ABNORMAL    ASSESSMENT AND PLAN

## 2013-03-18 NOTE — Assessment & Plan Note (Signed)
Symptoms are highly suggestive of premature beats. She has no symptoms suggestive of tachyarrhythmia. Cardiac workup last year was overall unremarkable. Given normal LV systolic function and no significant structural abnormalities, the chance of malignant arrhythmia is very low. I reassured her. I explained to her that certain medications can be considered for symptomatic relief. She will let us know if symptoms worsen. A small dose beta blocker can be considered.

## 2013-10-28 ENCOUNTER — Ambulatory Visit (INDEPENDENT_AMBULATORY_CARE_PROVIDER_SITE_OTHER): Payer: Managed Care, Other (non HMO) | Admitting: Internal Medicine

## 2013-10-28 ENCOUNTER — Encounter: Payer: Managed Care, Other (non HMO) | Admitting: Internal Medicine

## 2013-10-28 ENCOUNTER — Encounter: Payer: Self-pay | Admitting: Internal Medicine

## 2013-10-28 VITALS — BP 132/76 | HR 69 | Temp 98.2°F | Ht 64.5 in | Wt 126.0 lb

## 2013-10-28 DIAGNOSIS — Z0000 Encounter for general adult medical examination without abnormal findings: Secondary | ICD-10-CM

## 2013-10-28 NOTE — Assessment & Plan Note (Signed)
General medical exam normal today. Breast and pelvic exam deferred as these were recently completed by her OB/GYN. Mammogram ordered. Will check labs including CBC, CMP, lipid profile, vitamin D, TSH. Encouraged continued healthy lifestyle with healthy diet and regular physical activity. Followup in one year or sooner as needed.

## 2013-10-28 NOTE — Progress Notes (Signed)
Pre visit review using our clinic review tool, if applicable. No additional management support is needed unless otherwise documented below in the visit note. 

## 2013-10-28 NOTE — Patient Instructions (Signed)
It was nice to see you today.  You are doing well.  Continue healthy diet and set a goal of exercising 40 minutes three times per week.  Labs next week 11/05/2013.

## 2013-10-28 NOTE — Progress Notes (Signed)
   Subjective:    Patient ID: Erika Daugherty, female    DOB: [birth year], 48 y.o.   MRN: [id number]  HPI 48YO female presents for annual exam. Feeling well. No concerns today. Recently had pelvic and breast exam with OB. Last PAP 2014 normal. Mammogram due in July 2015. Trying to follow healthy diet and get exercise with walking.  Review of Systems  Constitutional: Negative for fever, chills, appetite change, fatigue and unexpected weight change.  Eyes: Negative for visual disturbance.  Respiratory: Negative for shortness of breath.   Cardiovascular: Negative for chest pain and leg swelling.  Gastrointestinal: Negative for nausea, vomiting, abdominal pain, diarrhea and constipation.  Skin: Negative for color change and rash.  Hematological: Negative for adenopathy. Does not bruise/bleed easily.  Psychiatric/Behavioral: Negative for dysphoric mood. The patient is not nervous/anxious.        Objective:    BP 132/76  Pulse 69  Temp(Src) 98.2 F (36.8 C) (Oral)  Ht 5' 4.5" (1.638 m)  Wt 126 lb (57.153 kg)  BMI 21.30 kg/m2  SpO2 98%  LMP 10/09/2013 Physical Exam  Constitutional: She is oriented to person, place, and time. She appears well-developed and well-nourished. No distress.  HENT:  Head: Normocephalic and atraumatic.  Right Ear: External ear normal.  Left Ear: External ear normal.  Nose: Nose normal.  Mouth/Throat: Oropharynx is clear and moist. No oropharyngeal exudate.  Eyes: Conjunctivae and EOM are normal. Pupils are equal, round, and reactive to light. Right eye exhibits no discharge.  Neck: Normal range of motion. Neck supple. No thyromegaly present.  Cardiovascular: Normal rate, regular rhythm, normal heart sounds and intact distal pulses.  Exam reveals no gallop and no friction rub.   No murmur heard. Pulmonary/Chest: Effort normal. No respiratory distress. She has no wheezes. She has no rales.  Abdominal: Soft. Bowel sounds are normal. She exhibits no  distension and no mass. There is no tenderness. There is no rebound and no guarding.  Musculoskeletal: Normal range of motion. She exhibits no edema and no tenderness.  Lymphadenopathy:    She has no cervical adenopathy.  Neurological: She is alert and oriented to person, place, and time. No cranial nerve deficit. Coordination normal.  Skin: Skin is warm and dry. No rash noted. She is not diaphoretic. No erythema. No pallor.  Psychiatric: She has a normal mood and affect. Her behavior is normal. Judgment and thought content normal.          Assessment & Plan:   Problem List Items Addressed This Visit     Unprioritized   Routine general medical examination at a health care facility - Primary     General medical exam normal today. Breast and pelvic exam deferred as these were recently completed by her OB/GYN. Mammogram ordered. Will check labs including CBC, CMP, lipid profile, vitamin D, TSH. Encouraged continued healthy lifestyle with healthy diet and regular physical activity. Followup in one year or sooner as needed.      Relevant Orders      MM Digital Screening      CBC with Differential      Comprehensive metabolic panel      Lipid panel      Microalbumin / creatinine urine ratio      Vit D  25 hydroxy (rtn osteoporosis monitoring)      TSH       Return in about 1 year (around 10/29/2014) for Physical.

## 2013-11-05 ENCOUNTER — Other Ambulatory Visit (INDEPENDENT_AMBULATORY_CARE_PROVIDER_SITE_OTHER): Payer: Managed Care, Other (non HMO)

## 2013-11-05 DIAGNOSIS — Z0000 Encounter for general adult medical examination without abnormal findings: Secondary | ICD-10-CM

## 2013-11-05 LAB — CBC WITH DIFFERENTIAL/PLATELET
BASOS PCT: 0.5 % (ref 0.0–3.0)
Basophils Absolute: 0 10*3/uL (ref 0.0–0.1)
EOS ABS: 0.1 10*3/uL (ref 0.0–0.7)
Eosinophils Relative: 1.3 % (ref 0.0–5.0)
HCT: 43.2 % (ref 36.0–46.0)
Hemoglobin: 14.6 g/dL (ref 12.0–15.0)
Lymphocytes Relative: 32.3 % (ref 12.0–46.0)
Lymphs Abs: 1.7 10*3/uL (ref 0.7–4.0)
MCHC: 33.6 g/dL (ref 30.0–36.0)
MCV: 95.2 fl (ref 78.0–100.0)
MONO ABS: 0.5 10*3/uL (ref 0.1–1.0)
Monocytes Relative: 8.8 % (ref 3.0–12.0)
NEUTROS PCT: 57.1 % (ref 43.0–77.0)
Neutro Abs: 3 10*3/uL (ref 1.4–7.7)
PLATELETS: 301 10*3/uL (ref 150.0–400.0)
RBC: 4.54 Mil/uL (ref 3.87–5.11)
RDW: 12.4 % (ref 11.5–15.5)
WBC: 5.2 10*3/uL (ref 4.0–10.5)

## 2013-11-05 LAB — LIPID PANEL
CHOLESTEROL: 168 mg/dL (ref 0–200)
HDL: 44.1 mg/dL (ref >39.00)
LDL Cholesterol: 97 mg/dL (ref 0–99)
NonHDL: 123.9
TRIGLYCERIDES: 134 mg/dL (ref 0.0–149.0)
Total CHOL/HDL Ratio: 4
VLDL: 26.8 mg/dL (ref 0.0–40.0)

## 2013-11-05 LAB — COMPREHENSIVE METABOLIC PANEL
ALK PHOS: 49 U/L (ref 39–117)
ALT: 14 U/L (ref 0–35)
AST: 15 U/L (ref 0–37)
Albumin: 4.2 g/dL (ref 3.5–5.2)
BILIRUBIN TOTAL: 0.9 mg/dL (ref 0.2–1.2)
BUN: 10 mg/dL (ref 6–23)
CO2: 27 mEq/L (ref 19–32)
Calcium: 9 mg/dL (ref 8.4–10.5)
Chloride: 103 mEq/L (ref 96–112)
Creatinine, Ser: 0.8 mg/dL (ref 0.4–1.2)
GFR: 80.79 mL/min (ref >60.00)
GLUCOSE: 81 mg/dL (ref 70–99)
Potassium: 3.7 mEq/L (ref 3.5–5.1)
SODIUM: 137 meq/L (ref 135–145)
Total Protein: 8.1 g/dL (ref 6.0–8.3)

## 2013-11-05 LAB — MICROALBUMIN / CREATININE URINE RATIO
Creatinine,U: 311.9 mg/dL
Microalb Creat Ratio: 0.3 mg/g (ref 0.0–30.0)
Microalb, Ur: 0.8 mg/dL (ref 0.0–1.9)

## 2013-11-06 LAB — TSH: TSH: 1.52 u[IU]/mL (ref 0.35–4.50)

## 2013-11-06 LAB — VITAMIN D 25 HYDROXY (VIT D DEFICIENCY, FRACTURES): VITD: 48.87 ng/mL

## 2014-02-15 ENCOUNTER — Encounter: Payer: Self-pay | Admitting: Internal Medicine

## 2014-07-10 LAB — HM PAP SMEAR

## 2014-09-16 ENCOUNTER — Ambulatory Visit
Admission: EM | Admit: 2014-09-16 | Discharge: 2014-09-16 | Disposition: A | Discharge status: Home / Self Care | Payer: BC Managed Care – PPO | Attending: Family Medicine | Admitting: Family Medicine

## 2014-09-16 DIAGNOSIS — K219 Gastro-esophageal reflux disease without esophagitis: Secondary | ICD-10-CM | POA: Insufficient documentation

## 2014-09-16 DIAGNOSIS — R350 Frequency of micturition: Secondary | ICD-10-CM | POA: Diagnosis present

## 2014-09-16 DIAGNOSIS — N390 Urinary tract infection, site not specified: Secondary | ICD-10-CM | POA: Diagnosis not present

## 2014-09-16 HISTORY — DX: Gastro-esophageal reflux disease without esophagitis: K21.9

## 2014-09-16 LAB — URINALYSIS COMPLETE WITH MICROSCOPIC (ARMC ONLY)
Bilirubin Urine: NEGATIVE
Glucose, UA: NEGATIVE mg/dL
KETONES UR: NEGATIVE mg/dL
Nitrite: NEGATIVE
PH: 6 (ref 5.0–8.0)
Protein, ur: NEGATIVE mg/dL
Specific Gravity, Urine: 1.025 (ref 1.005–1.030)

## 2014-09-16 MED ORDER — CIPROFLOXACIN HCL 250 MG PO TABS: 250.0000 mg | ORAL_TABLET | Freq: Two times a day (BID) | ORAL | Status: DC

## 2014-09-16 NOTE — ED Provider Notes (Signed)
CSN: [id number]     Arrival date & time 09/16/14  0810 History   None    Chief Complaint  Patient presents with  . Urinary Frequency   (Consider location/radiation/quality/duration/timing/severity/associated sxs/prior Treatment) Patient is a 49 y.o. female presenting with frequency. The history is provided by the patient.  Urinary Frequency This is a new problem. The current episode started 6 to 12 hours ago. The problem occurs constantly. Pertinent negatives include no chest pain, no abdominal pain, no headaches and no shortness of breath. Associated symptoms comments: Denies nausea/vomiting, fevers, chills, or back pain. Nothing relieves the symptoms. Treatments tried: took a nitrofurantoin tab this morning.    Past Medical History  Diagnosis Date  . Allergy   . Sinus infection   . Heart murmur   . UTI (lower urinary tract infection)   . GERD (gastroesophageal reflux disease)    Past Surgical History  Procedure Laterality Date  . Vaginal delivery      2  . Wisdom tooth extraction     Family History  Problem Relation Age of Onset  . Hypertension Mother   . Hyperlipidemia Mother     triglycerides  . Cancer Father     bile duct  . Heart disease Father 1   History  Substance Use Topics  . Smoking status: Never Smoker   . Smokeless tobacco: Never Used  . Alcohol Use: No     Comment: rare   OB History    No data available     Review of Systems  Respiratory: Negative for shortness of breath.   Cardiovascular: Negative for chest pain.  Gastrointestinal: Negative for abdominal pain.  Genitourinary: Positive for frequency.  Neurological: Negative for headaches.    Allergies  Erythromycin and Prednisone  Home Medications   Prior to Admission medications   Medication Sig Start Date End Date Taking? Authorizing Provider  fluticasone (FLONASE) 50 MCG/ACT nasal spray Place 2 sprays into the nose daily. 10/17/12  Yes Jackolyn Confer, MD  levonorgestrel-ethinyl  estradiol (NORDETTE) 0.15-30 MG-MCG tablet Take 1 tablet by mouth daily.   Yes Historical Provider, MD  omeprazole (PRILOSEC) 20 MG capsule Take 20 mg by mouth daily as needed.    Yes Historical Provider, MD  ciprofloxacin (CIPRO) 250 MG tablet Take 1 tablet (250 mg total) by mouth every 12 (twelve) hours. 09/16/14   Norval Gable, MD   BP 152/89 mmHg  Pulse 70  Temp(Src) 96.9 F (36.1 C) (Tympanic)  Resp 16  Ht 5\' 4"  (1.626 m)  Wt 126 lb (57.153 kg)  BMI 21.62 kg/m2  SpO2 100%  LMP 09/10/2014 (Exact Date) Physical Exam  Constitutional: She appears well-developed and well-nourished. No distress.  Abdominal: Soft. Bowel sounds are normal. She exhibits no distension and no mass. There is tenderness (mild suprapubic tenderness to palpation; no CVA tenderness). There is no rebound and no guarding.  Skin: She is not diaphoretic.  Nursing note and vitals reviewed.   ED Course  Procedures (including critical care time) Labs Review Labs Reviewed  URINALYSIS COMPLETEWITH MICROSCOPIC [facility])  - Abnormal; Notable for the following:    APPearance CLOUDY (*)    Hgb urine dipstick 2+ (*)    Leukocytes, UA 1+ (*)    Squamous Epithelial / LPF 0-5 (*)    All other components within normal limits  URINE CULTURE    Imaging Review No results found.   MDM   1. UTI (lower urinary tract infection)    Discharge Medication List as of 09/16/2014  9:29 AM    START taking these medications   Details  ciprofloxacin (CIPRO) 250 MG tablet Take 1 tablet (250 mg total) by mouth every 12 (twelve) hours., Starting 09/16/2014, Until Discontinued, Normal      Plan: 1. Test/x-ray results and diagnosis reviewed with patient 2. rx as per orders; risks, benefits, potential side effects reviewed with patient 3. Recommend supportive treatment with increase fluids 4. F/u prn if symptoms worsen or don't improve    Norval Gable, MD 09/16/14 [id number]

## 2014-09-16 NOTE — Discharge Instructions (Signed)

## 2014-09-16 NOTE — ED Notes (Signed)
States woke this am with urinary frequency and pressure. Hx of UTI's. Had an old Nitrofurantoin tablet and took 1 this am

## 2014-09-19 LAB — URINE CULTURE: CULTURE: NO GROWTH

## 2014-11-04 ENCOUNTER — Encounter: Payer: Managed Care, Other (non HMO) | Admitting: Internal Medicine

## 2014-11-24 ENCOUNTER — Encounter: Payer: Managed Care, Other (non HMO) | Admitting: Internal Medicine

## 2014-12-02 LAB — HM MAMMOGRAPHY: HM MAMMO: NORMAL

## 2014-12-10 ENCOUNTER — Encounter: Payer: Self-pay | Admitting: Internal Medicine

## 2014-12-10 ENCOUNTER — Ambulatory Visit (INDEPENDENT_AMBULATORY_CARE_PROVIDER_SITE_OTHER): Payer: BC Managed Care – PPO | Admitting: Internal Medicine

## 2014-12-10 VITALS — BP 138/78 | HR 71 | Temp 97.7°F | Ht 64.0 in | Wt 129.3 lb

## 2014-12-10 DIAGNOSIS — Z0000 Encounter for general adult medical examination without abnormal findings: Secondary | ICD-10-CM

## 2014-12-10 LAB — CBC WITH DIFFERENTIAL/PLATELET
BASOS ABS: 0 10*3/uL (ref 0.0–0.1)
Basophils Relative: 0.4 % (ref 0.0–3.0)
EOS ABS: 0.1 10*3/uL (ref 0.0–0.7)
Eosinophils Relative: 1.3 % (ref 0.0–5.0)
HCT: 40.5 % (ref 36.0–46.0)
Hemoglobin: 13.9 g/dL (ref 12.0–15.0)
LYMPHS PCT: 37.8 % (ref 12.0–46.0)
Lymphs Abs: 1.5 10*3/uL (ref 0.7–4.0)
MCHC: 34.2 g/dL (ref 30.0–36.0)
MCV: 94.2 fl (ref 78.0–100.0)
MONOS PCT: 11.7 % (ref 3.0–12.0)
Monocytes Absolute: 0.5 10*3/uL (ref 0.1–1.0)
Neutro Abs: 1.9 10*3/uL (ref 1.4–7.7)
Neutrophils Relative %: 48.8 % (ref 43.0–77.0)
PLATELETS: 253 10*3/uL (ref 150.0–400.0)
RBC: 4.3 Mil/uL (ref 3.87–5.11)
RDW: 12.5 % (ref 11.5–15.5)
WBC: 3.9 10*3/uL — ABNORMAL LOW (ref 4.0–10.5)

## 2014-12-10 LAB — COMPREHENSIVE METABOLIC PANEL
ALT: 18 U/L (ref 0–35)
AST: 17 U/L (ref 0–37)
Albumin: 4.1 g/dL (ref 3.5–5.2)
Alkaline Phosphatase: 59 U/L (ref 39–117)
BUN: 13 mg/dL (ref 6–23)
CO2: 29 mEq/L (ref 19–32)
Calcium: 9.1 mg/dL (ref 8.4–10.5)
Chloride: 104 mEq/L (ref 96–112)
Creatinine, Ser: 0.83 mg/dL (ref 0.40–1.20)
GFR: 78.18 mL/min (ref >60.00)
Glucose, Bld: 91 mg/dL (ref 70–99)
Potassium: 3.7 mEq/L (ref 3.5–5.1)
SODIUM: 139 meq/L (ref 135–145)
Total Bilirubin: 0.7 mg/dL (ref 0.2–1.2)
Total Protein: 7.7 g/dL (ref 6.0–8.3)

## 2014-12-10 LAB — LIPID PANEL
CHOL/HDL RATIO: 3
CHOLESTEROL: 133 mg/dL (ref 0–200)
HDL: 46.1 mg/dL (ref >39.00)
LDL Cholesterol: 69 mg/dL (ref 0–99)
NONHDL: 87.12
TRIGLYCERIDES: 93 mg/dL (ref 0.0–149.0)
VLDL: 18.6 mg/dL (ref 0.0–40.0)

## 2014-12-10 LAB — TSH: TSH: 4.42 u[IU]/mL (ref 0.35–4.50)

## 2014-12-10 LAB — VITAMIN D 25 HYDROXY (VIT D DEFICIENCY, FRACTURES): VITD: 30.86 ng/mL (ref 30.00–100.00)

## 2014-12-10 NOTE — Progress Notes (Signed)
Pre visit review using our clinic review tool, if applicable. No additional management support is needed unless otherwise documented below in the visit note. 

## 2014-12-10 NOTE — Patient Instructions (Signed)

## 2014-12-10 NOTE — Assessment & Plan Note (Signed)
General medical exam normal today. Breast and pelvic exam deferred as completed by OB. Mammogram UTD and reviewed. Encouraged healthy diet and exercise. Labs as ordered. Encouraged flu vaccine this fall.

## 2014-12-10 NOTE — Addendum Note (Signed)
Addended by: Ronette Deter A on: 12/10/2014 01:14 PM   Modules accepted: Miquel Dunn

## 2014-12-10 NOTE — Progress Notes (Addendum)
Subjective:    Patient ID: Erika Daugherty, female    DOB: Apr 14, 1966, 49 y.o.   MRN: [id number]  HPI  49YO female presents for physical exam.  Feeling well. No concerns today. Follows healthy diet and is active. Recently had PAP and mammogram with OB.   Wt Readings from Last 3 Encounters:  12/10/14 129 lb 4.8 oz (58.65 kg)  09/16/14 126 lb (57.153 kg)  10/28/13 126 lb (57.153 kg)   Past Surgical History  Procedure Laterality Date  . Vaginal delivery      2  . Wisdom tooth extraction     Family History  Problem Relation Age of Onset  . Hypertension Mother   . Hyperlipidemia Mother     triglycerides  . Cancer Father     bile duct  . Heart disease Father 52   Past Medical History  Diagnosis Date  . Allergy   . Sinus infection   . Heart murmur   . UTI (lower urinary tract infection)   . GERD (gastroesophageal reflux disease)      Past medical, surgical, family and social history per today's encounter.   Review of Systems  Constitutional: Negative for fever, chills, appetite change, fatigue and unexpected weight change.  Eyes: Negative for visual disturbance.  Respiratory: Negative for shortness of breath.   Cardiovascular: Negative for chest pain and leg swelling.  Gastrointestinal: Negative for nausea, vomiting, abdominal pain, diarrhea and constipation.  Musculoskeletal: Negative for myalgias and arthralgias.  Skin: Negative for color change and rash.  Hematological: Negative for adenopathy. Does not bruise/bleed easily.  Psychiatric/Behavioral: Negative for sleep disturbance and dysphoric mood. The patient is not nervous/anxious.        Objective:    BP 138/78 mmHg  Pulse 71  Temp(Src) 97.7 F (36.5 C) (Oral)  Ht 5\' 4"  (1.626 m)  Wt 129 lb 4.8 oz (58.65 kg)  BMI 22.18 kg/m2  SpO2 99%  LMP 12/03/2014 Physical Exam  Constitutional: She is oriented to person, place, and time. She appears well-developed and well-nourished. No distress.  HENT:    Head: Normocephalic and atraumatic.  Right Ear: External ear normal.  Left Ear: External ear normal.  Nose: Nose normal.  Mouth/Throat: Oropharynx is clear and moist. No oropharyngeal exudate.  Eyes: Conjunctivae and EOM are normal. Pupils are equal, round, and reactive to light. Right eye exhibits no discharge.  Neck: Normal range of motion. Neck supple. No thyromegaly present.  Cardiovascular: Normal rate, regular rhythm, normal heart sounds and intact distal pulses.  Exam reveals no gallop and no friction rub.   No murmur heard. Pulmonary/Chest: Effort normal. No accessory muscle usage. No respiratory distress. She has no decreased breath sounds. She has no wheezes. She has no rhonchi. She has no rales.  Abdominal: Soft. Bowel sounds are normal. She exhibits no distension and no mass. There is no tenderness. There is no rebound and no guarding.  Musculoskeletal: Normal range of motion. She exhibits no edema or tenderness.  Lymphadenopathy:    She has no cervical adenopathy.  Neurological: She is alert and oriented to person, place, and time. No cranial nerve deficit. Coordination normal.  Skin: Skin is warm and dry. No rash noted. She is not diaphoretic. No erythema. No pallor.  Psychiatric: She has a normal mood and affect. Her behavior is normal. Judgment and thought content normal.          Assessment & Plan:   Problem List Items Addressed This Visit      Unprioritized  Routine general medical examination at a health care facility - Primary    General medical exam normal today. Breast and pelvic exam deferred as completed by OB. Mammogram UTD and reviewed. Encouraged healthy diet and exercise. Labs as ordered. Encouraged flu vaccine this fall.      Relevant Orders   TSH (Completed)   CBC with Differential/Platelet (Completed)   Comprehensive metabolic panel (Completed)   Lipid panel (Completed)   Vit D  25 hydroxy (rtn osteoporosis monitoring) (Completed)        Return in about 1 year (around 12/10/2015) for Physical.

## 2014-12-17 ENCOUNTER — Telehealth: Payer: Self-pay | Admitting: Internal Medicine

## 2014-12-17 DIAGNOSIS — D72819 Decreased white blood cell count, unspecified: Secondary | ICD-10-CM

## 2014-12-17 NOTE — Telephone Encounter (Signed)
Orders placed.

## 2014-12-17 NOTE — Telephone Encounter (Signed)
Pt is scheduled for lab work 12/19/2014 @2 :30p. Need orders :) Thank You!

## 2014-12-19 ENCOUNTER — Other Ambulatory Visit (INDEPENDENT_AMBULATORY_CARE_PROVIDER_SITE_OTHER): Payer: BC Managed Care – PPO

## 2014-12-19 DIAGNOSIS — D72819 Decreased white blood cell count, unspecified: Secondary | ICD-10-CM

## 2014-12-19 LAB — CBC WITH DIFFERENTIAL/PLATELET
Basophils Absolute: 0 10*3/uL (ref 0.0–0.1)
Basophils Relative: 0.6 % (ref 0.0–3.0)
EOS ABS: 0.1 10*3/uL (ref 0.0–0.7)
Eosinophils Relative: 1.1 % (ref 0.0–5.0)
HCT: 39.6 % (ref 36.0–46.0)
HEMOGLOBIN: 13.4 g/dL (ref 12.0–15.0)
Lymphocytes Relative: 37.1 % (ref 12.0–46.0)
Lymphs Abs: 2 10*3/uL (ref 0.7–4.0)
MCHC: 33.7 g/dL (ref 30.0–36.0)
MCV: 94.4 fl (ref 78.0–100.0)
Monocytes Absolute: 0.4 10*3/uL (ref 0.1–1.0)
Monocytes Relative: 8.3 % (ref 3.0–12.0)
NEUTROS ABS: 2.9 10*3/uL (ref 1.4–7.7)
Neutrophils Relative %: 52.9 % (ref 43.0–77.0)
PLATELETS: 256 10*3/uL (ref 150.0–400.0)
RBC: 4.2 Mil/uL (ref 3.87–5.11)
RDW: 12.7 % (ref 11.5–15.5)
WBC: 5.4 10*3/uL (ref 4.0–10.5)

## 2015-06-15 ENCOUNTER — Encounter: Payer: Self-pay | Admitting: Nurse Practitioner

## 2015-06-15 ENCOUNTER — Ambulatory Visit (INDEPENDENT_AMBULATORY_CARE_PROVIDER_SITE_OTHER): Payer: BC Managed Care – PPO | Admitting: Nurse Practitioner

## 2015-06-15 VITALS — BP 160/90 | HR 78 | Temp 97.5°F | Ht 64.0 in | Wt 133.0 lb

## 2015-06-15 DIAGNOSIS — I10 Essential (primary) hypertension: Secondary | ICD-10-CM | POA: Diagnosis not present

## 2015-06-15 MED ORDER — HYDROCHLOROTHIAZIDE 12.5 MG PO TABS: 12.5000 mg | ORAL_TABLET | Freq: Every day | ORAL | Status: DC

## 2015-06-15 NOTE — Patient Instructions (Signed)
Take the new BP medication for 1 week. Please send via MyChart 3-5 readings from different times of day.

## 2015-06-15 NOTE — Progress Notes (Signed)
Pre visit review using our clinic review tool, if applicable. No additional management support is needed unless otherwise documented below in the visit note. 

## 2015-06-15 NOTE — Progress Notes (Signed)
Patient ID: Erika Daugherty, female    DOB: Aug 04, 1965  Age: 50 y.o. MRN: [id number]  CC: Hypertension   HPI Erika Daugherty presents for CC of hypertension over the last week and a half.   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   She has past surgical history that includes Vaginal delivery and Wisdom tooth extraction.   Her family history includes Cancer in her father; Heart disease (age of onset: 62) in her father; Hyperlipidemia in her mother; Hypertension in her mother.She reports that she has never smoked. She has never used smokeless tobacco. She reports that she does not drink alcohol or use illicit drugs.  Outpatient Prescriptions Prior to Visit  Medication Sig Dispense Refill  . fluticasone (FLONASE) 50 MCG/ACT nasal spray Place 2 sprays into the nose daily. 16 g 11  . levonorgestrel-ethinyl estradiol (NORDETTE) 0.15-30 MG-MCG tablet Take 1 tablet by mouth daily.    Marland Kitchen omeprazole (PRILOSEC) 20 MG capsule Take 20 mg by mouth daily as needed.      No facility-administered medications prior to visit.    ROS Review of Systems  Constitutional: Negative for fever, chills, diaphoresis and fatigue.  Eyes: Negative for visual disturbance.  Respiratory: Negative for chest tightness, shortness of breath and wheezing.   Cardiovascular: Negative for chest pain, palpitations and leg swelling.  Gastrointestinal: Negative for nausea, vomiting  and diarrhea.  Skin: Negative for rash.  Neurological: Negative for dizziness, weakness, numbness and headaches.  Psychiatric/Behavioral: The patient is not nervous/anxious.     Objective:  BP 160/90 mmHg  Pulse 78  Temp(Src) 97.5 F (36.4 C) (Oral)  Ht 5\' 4"  (1.626 m)  Wt 133 lb (60.328 kg)  BMI 22.82 kg/m2  SpO2 98%  LMP 05/20/2015  Physical Exam  Constitutional: She is oriented to person, place, and time. She appears well-developed and well-nourished. No distress.  HENT:  Head: Normocephalic and atraumatic.  Right Ear: External ear normal.  Left Ear: External ear normal.  Cardiovascular: Normal rate and regular rhythm.  Exam reveals no gallop and no friction rub.   Murmur heard. Pulmonary/Chest: Effort normal and breath sounds normal. No respiratory distress. She has no wheezes. She has no rales. She exhibits no tenderness.  Neurological: She is alert and oriented to person, place, and time. No cranial nerve deficit. She exhibits normal muscle tone. Coordination normal.  Skin: Skin is warm and dry. No rash noted. She is not diaphoretic.  Psychiatric: She has a normal mood and affect. Her behavior is normal. Judgment and thought content normal.   Assessment & Plan:   Alyha was seen today for hypertension.  Diagnoses and all orders for this visit:  Benign essential HTN  Other orders -     hydrochlorothiazide (HYDRODIURIL) 12.5 MG tablet; Take 1 tablet (12.5 mg total) by mouth daily.  I am having Erika Daugherty start on hydrochlorothiazide. I am also having her maintain her levonorgestrel-ethinyl estradiol, omeprazole, and fluticasone.  Meds ordered this encounter  Medications  . hydrochlorothiazide (HYDRODIURIL) 12.5 MG tablet    Sig: Take 1 tablet (12.5 mg total) by mouth daily.    Dispense:  30 tablet    Refill:  0    Order Specific Question:  Supervising Provider    Answer:  Crecencio Mc [[id number]]     Follow-up: Return if symptoms worsen or fail to improve.

## 2015-06-16 ENCOUNTER — Encounter: Payer: Self-pay | Admitting: Nurse Practitioner

## 2015-06-17 DIAGNOSIS — I10 Essential (primary) hypertension: Secondary | ICD-10-CM | POA: Insufficient documentation

## 2015-06-17 NOTE — Assessment & Plan Note (Addendum)
BP Readings from Last 3 Encounters:  06/15/15 160/90  12/10/14 138/78  09/16/14 152/89   Increased today. Will start on HCTZ 12.5 mg daily after discussion about choices. Will report a BP to [place] in 5 days of starting the medication to avoid returning. FU with PCP in 1 month

## 2015-06-29 ENCOUNTER — Other Ambulatory Visit: Payer: Self-pay | Admitting: Nurse Practitioner

## 2015-06-29 MED ORDER — LISINOPRIL 5 MG PO TABS: 5.0000 mg | ORAL_TABLET | Freq: Every day | ORAL | Status: DC

## 2015-07-10 ENCOUNTER — Encounter: Payer: Self-pay | Admitting: Internal Medicine

## 2015-07-10 ENCOUNTER — Ambulatory Visit (INDEPENDENT_AMBULATORY_CARE_PROVIDER_SITE_OTHER): Payer: BC Managed Care – PPO | Admitting: Internal Medicine

## 2015-07-10 VITALS — BP 140/76 | HR 70 | Temp 98.0°F | Ht 64.0 in | Wt 133.5 lb

## 2015-07-10 DIAGNOSIS — I10 Essential (primary) hypertension: Secondary | ICD-10-CM

## 2015-07-10 LAB — BASIC METABOLIC PANEL
BUN: 16 mg/dL (ref 6–23)
CO2: 29 meq/L (ref 19–32)
Calcium: 9.3 mg/dL (ref 8.4–10.5)
Chloride: 102 mEq/L (ref 96–112)
Creatinine, Ser: 0.8 mg/dL (ref 0.40–1.20)
GFR: 81.37 mL/min (ref >60.00)
GLUCOSE: 121 mg/dL — AB (ref 70–99)
POTASSIUM: 3.5 meq/L (ref 3.5–5.1)
SODIUM: 139 meq/L (ref 135–145)

## 2015-07-10 MED ORDER — LISINOPRIL 5 MG PO TABS: 5.0000 mg | ORAL_TABLET | Freq: Every day | ORAL | Status: DC

## 2015-07-10 MED ORDER — HYDROCHLOROTHIAZIDE 12.5 MG PO TABS: 12.5000 mg | ORAL_TABLET | Freq: Every day | ORAL | Status: DC

## 2015-07-10 NOTE — Progress Notes (Signed)
Subjective:    Patient ID: Erika Daugherty, female    DOB: July 04, 1965, 50 y.o.   MRN: [id number]  HPI  50YO female presents for follow up.  HTN - Seen by Lorane Gell, started on HCTZ in 06/2015. BP continued to be elevated, so added Lisinopril one week ago. Now BP running 100s-130s/70-80s.   Wt Readings from Last 3 Encounters:  07/10/15 133 lb 8 oz (60.555 kg)  06/15/15 133 lb (60.328 kg)  12/10/14 129 lb 4.8 oz (58.65 kg)   BP Readings from Last 3 Encounters:  07/10/15 140/76  06/15/15 160/90  12/10/14 138/78    Past Medical History  Diagnosis Date  . Allergy   . Sinus infection   . Heart murmur   . UTI (lower urinary tract infection)   . GERD (gastroesophageal reflux disease)    Family History  Problem Relation Age of Onset  . Hypertension Mother   . Hyperlipidemia Mother     triglycerides  . Cancer Father     bile duct  . Heart disease Father 24   Past Surgical History  Procedure Laterality Date  . Vaginal delivery      2  . Wisdom tooth extraction     Social History   Social History  . Marital Status: Married    Spouse Name: N/A  . Number of Children: N/A  . Years of Education: N/A   Social History Main Topics  . Smoking status: Never Smoker   . Smokeless tobacco: Never Used  . Alcohol Use: No     Comment: rare  . Drug Use: No  . Sexual Activity: Not Asked   Other Topics Concern  . None   Social History Narrative   Lives in [place], [place] with husband and two children (16YO, 11YO) and dog.      Work - Arboriculturist   Diet - relative healthy   Exercise - walk or elliptical 34min 5 days per week    Review of Systems  Constitutional: Negative for fever, chills, appetite change, fatigue and unexpected weight change.  Eyes: Negative for visual disturbance.  Respiratory: Negative for shortness of breath.   Cardiovascular: Negative for chest pain, palpitations and leg swelling.  Gastrointestinal: Negative for abdominal pain.  Skin: Negative  for color change and rash.  Hematological: Negative for adenopathy. Does not bruise/bleed easily.  Psychiatric/Behavioral: Negative for dysphoric mood. The patient is nervous/anxious.        Objective:    BP 140/76 mmHg  Pulse 70  Temp(Src) 98 F (36.7 C) (Oral)  Ht 5\' 4"  (1.626 m)  Wt 133 lb 8 oz (60.555 kg)  BMI 22.90 kg/m2  SpO2 99%  LMP 05/20/2015 Physical Exam  Constitutional: She is oriented to person, place, and time. She appears well-developed and well-nourished. No distress.  HENT:  Head: Normocephalic and atraumatic.  Right Ear: External ear normal.  Left Ear: External ear normal.  Nose: Nose normal.  Mouth/Throat: Oropharynx is clear and moist. No oropharyngeal exudate.  Eyes: Conjunctivae are normal. Pupils are equal, round, and reactive to light. Right eye exhibits no discharge. Left eye exhibits no discharge. No scleral icterus.  Neck: Normal range of motion. Neck supple. No tracheal deviation present. No thyromegaly present.  Cardiovascular: Normal rate, regular rhythm, normal heart sounds and intact distal pulses.  Exam reveals no gallop and no friction rub.   No murmur heard. Pulmonary/Chest: Effort normal and breath sounds normal. No respiratory distress. She has no wheezes. She has no rales. She  exhibits no tenderness.  Musculoskeletal: Normal range of motion. She exhibits no edema or tenderness.  Lymphadenopathy:    She has no cervical adenopathy.  Neurological: She is alert and oriented to person, place, and time. No cranial nerve deficit. She exhibits normal muscle tone. Coordination normal.  Skin: Skin is warm and dry. No rash noted. She is not diaphoretic. No erythema. No pallor.  Psychiatric: She has a normal mood and affect. Her behavior is normal. Judgment and thought content normal.          Assessment & Plan:   Problem List Items Addressed This Visit      Unprioritized   Benign essential HTN - Primary    BP Readings from Last 3 Encounters:    07/10/15 140/76  06/15/15 160/90  12/10/14 138/78   BP improved. Renal function today to check Cr and K. Follow up in 5 months and prn.      Relevant Medications   hydrochlorothiazide (HYDRODIURIL) 12.5 MG tablet   lisinopril (PRINIVIL,ZESTRIL) 5 MG tablet   Other Relevant Orders   Basic Metabolic Panel (BMET)       Return in about 5 months (around 12/10/2015) for Physical.  Ronette Deter, MD Internal [facility]

## 2015-07-10 NOTE — Progress Notes (Signed)
Pre visit review using our clinic review tool, if applicable. No additional management support is needed unless otherwise documented below in the visit note. 

## 2015-07-10 NOTE — Assessment & Plan Note (Signed)
BP Readings from Last 3 Encounters:  07/10/15 140/76  06/15/15 160/90  12/10/14 138/78   BP improved. Renal function today to check Cr and K. Follow up in 5 months and prn.

## 2015-07-10 NOTE — Patient Instructions (Addendum)
Labs today.  Continue current medication.  Follow up in 12/2015

## 2015-07-11 ENCOUNTER — Other Ambulatory Visit: Payer: Self-pay | Admitting: Nurse Practitioner

## 2015-07-26 ENCOUNTER — Other Ambulatory Visit: Payer: Self-pay | Admitting: Nurse Practitioner

## 2015-09-23 ENCOUNTER — Ambulatory Visit (INDEPENDENT_AMBULATORY_CARE_PROVIDER_SITE_OTHER): Payer: BC Managed Care – PPO | Admitting: Family Medicine

## 2015-09-23 ENCOUNTER — Telehealth: Payer: Self-pay | Admitting: *Deleted

## 2015-09-23 ENCOUNTER — Telehealth: Payer: Self-pay | Admitting: Internal Medicine

## 2015-09-23 ENCOUNTER — Encounter: Payer: Self-pay | Admitting: Family Medicine

## 2015-09-23 VITALS — BP 132/86 | HR 69 | Temp 97.9°F | Ht 63.5 in | Wt 131.4 lb

## 2015-09-23 DIAGNOSIS — R51 Headache: Secondary | ICD-10-CM

## 2015-09-23 DIAGNOSIS — R519 Headache, unspecified: Secondary | ICD-10-CM

## 2015-09-23 DIAGNOSIS — H8110 Benign paroxysmal vertigo, unspecified ear: Secondary | ICD-10-CM | POA: Insufficient documentation

## 2015-09-23 NOTE — Assessment & Plan Note (Addendum)
New problem. Uncertain diagnosis & prognosis. Complaint is concerning for temporal arteritis (although she does not exhibit any signs or symptoms of temporal arteritis and does not fit the typical age). Obtaining ESR/CRP.

## 2015-09-23 NOTE — Telephone Encounter (Signed)
On your schedule today, thanks

## 2015-09-23 NOTE — Assessment & Plan Note (Signed)
New acute problem. History and exam consistent with BPPV. Discussed treatment options - watchful waiting, medication, vestibular rehab. Patient elected for watchful waiting.  She will call if she fails to improve or worsens by the end of this week.

## 2015-09-23 NOTE — Telephone Encounter (Signed)
Spoke to patient. Offered appt. Patient stated she just scheduled appt with Dr. Lacinda Axon today @ 4pm.

## 2015-09-23 NOTE — Telephone Encounter (Signed)
Patient is experiencing some dizziness and fatigue. She would like to see Dr. Gilford Rile only. She did not want to see another provider. She was transferred to the nurse line for her dizziness. Advise a place on Dr. Derry Skill schedule to place patient.

## 2015-09-23 NOTE — Patient Instructions (Signed)
Call if you fail to improve.  Take care  Dr. Lacinda Axon   Benign Positional Vertigo Vertigo is the feeling that you or your surroundings are moving when they are not. Benign positional vertigo is the most common form of vertigo. The cause of this condition is not serious (is benign). This condition is triggered by certain movements and positions (is positional). This condition can be dangerous if it occurs while you are doing something that could endanger you or others, such as driving.  CAUSES In many cases, the cause of this condition is not known. It may be caused by a disturbance in an area of the inner ear that helps your brain to sense movement and balance. This disturbance can be caused by a viral infection (labyrinthitis), head injury, or repetitive motion. RISK FACTORS This condition is more likely to develop in:  Women.  People who are 81 years of age or older. SYMPTOMS Symptoms of this condition usually happen when you move your head or your eyes in different directions. Symptoms may start suddenly, and they usually last for less than a minute. Symptoms may include:  Loss of balance and falling.  Feeling like you are spinning or moving.  Feeling like your surroundings are spinning or moving.  Nausea and vomiting.  Blurred vision.  Dizziness.  Involuntary eye movement (nystagmus). Symptoms can be mild and cause only slight annoyance, or they can be severe and interfere with daily life. Episodes of benign positional vertigo may return (recur) over time, and they may be triggered by certain movements. Symptoms may improve over time. DIAGNOSIS This condition is usually diagnosed by medical history and a physical exam of the head, neck, and ears. You may be referred to a health care provider who specializes in ear, nose, and throat (ENT) problems (otolaryngologist) or a provider who specializes in disorders of the nervous system (neurologist). You may have additional testing,  including:  MRI.  A CT scan.  Eye movement tests. Your health care provider may ask you to change positions quickly while he or she watches you for symptoms of benign positional vertigo, such as nystagmus. Eye movement may be tested with an electronystagmogram (ENG), caloric stimulation, the Dix-Hallpike test, or the roll test.  An electroencephalogram (EEG). This records electrical activity in your brain.  Hearing tests. TREATMENT Usually, your health care provider will treat this by moving your head in specific positions to adjust your inner ear back to normal. Surgery may be needed in severe cases, but this is rare. In some cases, benign positional vertigo may resolve on its own in 2-4 weeks. HOME CARE INSTRUCTIONS Safety  Move slowly.Avoid sudden body or head movements.  Avoid driving.  Avoid operating heavy machinery.  Avoid doing any tasks that would be dangerous to you or others if a vertigo episode would occur.  If you have trouble walking or keeping your balance, try using a cane for stability. If you feel dizzy or unstable, sit down right away.  Return to your normal activities as told by your health care provider. Ask your health care provider what activities are safe for you. General Instructions  Take over-the-counter and prescription medicines only as told by your health care provider.  Avoid certain positions or movements as told by your health care provider.  Drink enough fluid to keep your urine clear or pale yellow.  Keep all follow-up visits as told by your health care provider. This is important. SEEK MEDICAL CARE IF:  You have a fever.  Your condition gets worse or you develop new symptoms.  Your family or friends notice any behavioral changes.  Your nausea or vomiting gets worse.  You have numbness or a "pins and needles" sensation. SEEK IMMEDIATE MEDICAL CARE IF:  You have difficulty speaking or moving.  You are always dizzy.  You  faint.  You develop severe headaches.  You have weakness in your legs or arms.  You have changes in your hearing or vision.  You develop a stiff neck.  You develop sensitivity to light.   This information is not intended to replace advice given to you by your health care provider. Make sure you discuss any questions you have with your health care provider.   Document Released: 01/24/2006 Document Revised: 01/07/2015 Document Reviewed: 08/11/2014 Elsevier Interactive Patient Education [employer].

## 2015-09-23 NOTE — Progress Notes (Signed)
Pre visit review using our clinic review tool, if applicable. No additional management support is needed unless otherwise documented below in the visit note. 

## 2015-09-23 NOTE — Telephone Encounter (Signed)
Patient Name: Erika Daugherty  DOB: 03/24/1966    Initial Comment Caller states c/o dizziness   Nurse Assessment  Nurse: Raphael Gibney, RN, Vera Date/Time (Eastern Time): 09/23/2015 9:06:08 AM  Confirm and document reason for call. If symptomatic, describe symptoms. You must click the next button to save text entered. ---Caller states she is having dizziness. When she turns her head too fast or leans her head to the left or right or bends over, she is dizzy. Seems lightheaded. Dizziness started on Sunday night.  Has the patient traveled out of the country within the last 30 days? ---Not Applicable  Does the patient have any new or worsening symptoms? ---Yes  Will a triage be completed? ---Yes  Related visit to physician within the last 2 weeks? ---No  Does the PT have any chronic conditions? (i.e. diabetes, asthma, etc.) ---Yes  List chronic conditions. ---HTN  Is the patient pregnant or possibly pregnant? (Ask all females between the ages of 27-55) ---No  Is this a behavioral health or substance abuse call? ---No     Guidelines    Guideline Title Affirmed Question Affirmed Notes  Dizziness - Lightheadedness Taking a medicine that could cause dizziness (e.g., blood pressure medications, diuretics)    Final Disposition User   See Physician within 24 Hours Raphael Gibney, RN, Vanita Ingles    Comments  Appt scheduled for Dr. Thersa Salt at 4 pm on 09/23/15   Referrals  REFERRED TO PCP OFFICE   Disagree/Comply: Comply

## 2015-09-23 NOTE — Progress Notes (Signed)
Subjective:  Patient ID: Erika Daugherty, female    DOB: 06/20/1965  Age: 50 y.o. MRN: [id number]  CC: Dizziness  HPI:  50 year old female presents with complaints of dizziness.  Dizziness  Patient reports that it started 1.5 weeks ago.  She woke up at nightAnd had dizziness.  She states that her eyes were "jumping".   She states that approximately 10 seconds and then resolve spontaneously.  No associated nausea or vomiting.  He states that this is happened a few times since the first occurrence 1-1/2 weeks ago.  She states that the dizziness worsened with positional change and movement of her head.  Lasts briefly (just a few seconds) then resolves spontaneously.  She reports that she's had some neck discomfort as well.  No other associated symptoms.  Additionally, she states that she's had some left temporal region tenderness.  No reports of headache. No vision changes.  Social Hx   Social History   Social History  . Marital Status: Married    Spouse Name: N/A  . Number of Children: N/A  . Years of Education: N/A   Social History Main Topics  . Smoking status: Never Smoker   . Smokeless tobacco: Never Used  . Alcohol Use: No     Comment: rare  . Drug Use: No  . Sexual Activity: Not Asked   Other Topics Concern  . None   Social History Narrative   Lives in [place], [place] with husband and two children (16YO, 11YO) and dog.      Work - Arboriculturist   Diet - relative healthy   Exercise - walk or elliptical 64mn 5 days per week    Review of Systems  Constitutional: Positive for fatigue.  Neurological: Positive for dizziness.    Objective:  BP 132/86 mmHg  Pulse 69  Temp(Src) 97.9 F (36.6 C) (Oral)  Ht 5' 3.5" (1.613 m)  Wt 131 lb 6.4 oz (59.603 kg)  BMI 22.91 kg/m2  SpO2 98%  BP/Weight 09/23/2015 07/10/2015 24/43/1540 Systolic BP [id number] Diastolic BP 86 76 90  Wt. (Lbs) 131.4 133.5 133  BMI 22.91 22.9 22.82    Physical Exam    Constitutional: She is oriented to person, place, and time. She appears well-developed. No distress.  HENT:  Head: Normocephalic and atraumatic.  Mouth/Throat: Oropharynx is clear and moist.  Tenderness, Left temple.  Eyes: Conjunctivae are normal. No scleral icterus.  Neck: Neck supple.  Cardiovascular: Normal rate and regular rhythm.   Pulmonary/Chest: Effort normal. She has no wheezes. She has no rales.  Neurological: She is alert and oriented to person, place, and time.  PERRLA. CN 2-12 intact. + dizziness with Dix hallpike. No nystagmus appreciated.   Psychiatric: She has a normal mood and affect.  Vitals reviewed.   Lab Results  Component Value Date   WBC 5.4 12/19/2014   HGB 13.4 12/19/2014   HCT 39.6 12/19/2014   PLT 256.0 12/19/2014   GLUCOSE 121* 07/10/2015   CHOL 133 12/10/2014   TRIG 93.0 12/10/2014   HDL 46.10 12/10/2014   LDLCALC 69 12/10/2014   ALT 18 12/10/2014   AST 17 12/10/2014   NA 139 07/10/2015   K 3.5 07/10/2015   CL 102 07/10/2015   CREATININE 0.80 07/10/2015   BUN 16 07/10/2015   CO2 29 07/10/2015   TSH 4.42 12/10/2014   MICROALBUR 0.8 11/05/2013    Assessment & Plan:   Problem List Items Addressed This Visit  Temporal pain    New problem. Uncertain diagnosis & prognosis. Complaint is concerning for temporal arteritis (although she does not exhibit any signs or symptoms of temporal arteritis and does not fit the typical age). Obtaining ESR/CRP.      Relevant Orders   Sed Rate (ESR)   C-reactive protein   BPPV (benign paroxysmal positional vertigo) - Primary    New acute problem. History and exam consistent with BPPV. Discussed treatment options - watchful waiting, medication, vestibular rehab. Patient elected for watchful waiting.  She will call if she fails to improve or worsens by the end of this week.        Follow-up: PRN  [place]

## 2015-09-23 NOTE — Telephone Encounter (Signed)
Will send to Dr. Gilford Rile to advise.

## 2015-09-23 NOTE — Telephone Encounter (Signed)
We can bring her at 7am for an acute 75min visit.

## 2015-09-24 ENCOUNTER — Encounter: Payer: Self-pay | Admitting: Internal Medicine

## 2015-09-24 LAB — C-REACTIVE PROTEIN: CRP: 0.4 mg/dL — ABNORMAL LOW (ref 0.5–20.0)

## 2015-09-24 LAB — SEDIMENTATION RATE: Sed Rate: 37 mm/hr — ABNORMAL HIGH (ref 0–20)

## 2015-09-24 NOTE — Telephone Encounter (Signed)
Patient seen on 09/23/15 by Dr.Cook.

## 2015-12-04 LAB — HM MAMMOGRAPHY

## 2015-12-08 ENCOUNTER — Encounter: Payer: Self-pay | Admitting: Internal Medicine

## 2015-12-14 ENCOUNTER — Ambulatory Visit (INDEPENDENT_AMBULATORY_CARE_PROVIDER_SITE_OTHER): Payer: BC Managed Care – PPO | Admitting: Family Medicine

## 2015-12-14 ENCOUNTER — Encounter: Payer: BC Managed Care – PPO | Admitting: Internal Medicine

## 2015-12-14 ENCOUNTER — Encounter: Payer: Self-pay | Admitting: Family Medicine

## 2015-12-14 VITALS — BP 137/66 | HR 66 | Temp 98.0°F | Ht 65.0 in | Wt 133.5 lb

## 2015-12-14 DIAGNOSIS — Z0000 Encounter for general adult medical examination without abnormal findings: Secondary | ICD-10-CM

## 2015-12-14 DIAGNOSIS — Z13220 Encounter for screening for lipoid disorders: Secondary | ICD-10-CM | POA: Diagnosis not present

## 2015-12-14 DIAGNOSIS — Z130 Encounter for screening for diseases of the blood and blood-forming organs and certain disorders involving the immune mechanism: Secondary | ICD-10-CM | POA: Diagnosis not present

## 2015-12-14 DIAGNOSIS — R7309 Other abnormal glucose: Secondary | ICD-10-CM

## 2015-12-14 DIAGNOSIS — I10 Essential (primary) hypertension: Secondary | ICD-10-CM

## 2015-12-14 LAB — COMPREHENSIVE METABOLIC PANEL
ALK PHOS: 52 U/L (ref 39–117)
ALT: 12 U/L (ref 0–35)
AST: 13 U/L (ref 0–37)
Albumin: 3.9 g/dL (ref 3.5–5.2)
BILIRUBIN TOTAL: 0.5 mg/dL (ref 0.2–1.2)
BUN: 14 mg/dL (ref 6–23)
CALCIUM: 8.9 mg/dL (ref 8.4–10.5)
CO2: 30 mEq/L (ref 19–32)
Chloride: 103 mEq/L (ref 96–112)
Creatinine, Ser: 0.83 mg/dL (ref 0.40–1.20)
GFR: 77.84 mL/min (ref >60.00)
Glucose, Bld: 91 mg/dL (ref 70–99)
POTASSIUM: 3.7 meq/L (ref 3.5–5.1)
Sodium: 139 mEq/L (ref 135–145)
TOTAL PROTEIN: 7.7 g/dL (ref 6.0–8.3)

## 2015-12-14 LAB — CBC
HEMATOCRIT: 37.7 % (ref 36.0–46.0)
HEMOGLOBIN: 13.1 g/dL (ref 12.0–15.0)
MCHC: 34.8 g/dL (ref 30.0–36.0)
MCV: 92.1 fl (ref 78.0–100.0)
PLATELETS: 230 10*3/uL (ref 150.0–400.0)
RBC: 4.09 Mil/uL (ref 3.87–5.11)
RDW: 12.3 % (ref 11.5–15.5)
WBC: 4.5 10*3/uL (ref 4.0–10.5)

## 2015-12-14 LAB — LIPID PANEL
CHOLESTEROL: 151 mg/dL (ref 0–200)
HDL: 48.1 mg/dL (ref >39.00)
LDL Cholesterol: 76 mg/dL (ref 0–99)
NonHDL: 102.88
TRIGLYCERIDES: 135 mg/dL (ref 0.0–149.0)
Total CHOL/HDL Ratio: 3
VLDL: 27 mg/dL (ref 0.0–40.0)

## 2015-12-14 LAB — HEMOGLOBIN A1C: Hgb A1c MFr Bld: 5.3 % (ref 4.6–6.5)

## 2015-12-14 NOTE — Assessment & Plan Note (Signed)
Pap smear and mammogram up-to-date. Screening labs today. Immunizations up-to-date. Advised regular exercise.

## 2015-12-14 NOTE — Progress Notes (Signed)
Pre visit review using our clinic review tool, if applicable. No additional management support is needed unless otherwise documented below in the visit note. 

## 2015-12-14 NOTE — Progress Notes (Signed)
Subjective:  Patient ID: Erika Daugherty, female    DOB: Oct 16, 1965  Age: 50 y.o. MRN: [id number]  CC: Annual physical exam  HPI Erika Daugherty is a 50 y.o. female presents to the clinic today for an annual physical exam.  Preventative Healthcare  Pap smear: Up to date (done in 2016; due in 2019)   Mammogram: Up to date. 12/04/15.  Colonoscopy: Not indicated yet.  Immunizations  Tetanus - Up to date.  Pneumococcal - Not yet indicated.   Flu - We do not have vaccines yet.  Zoster - Not yet indicated.  Labs: Screening labs today.   Exercise: No regular exercise.   Alcohol use: Rare alcohol use.  Smoking/tobacco use: Nonsmoker.  Regular dental exams: In need of dental exam.   Wears seat belt: Yes.   PMH, Surgical Hx, Family Hx, Social History reviewed and updated as below.  Past Medical History:  Diagnosis Date  . Allergy   . GERD (gastroesophageal reflux disease)   . Heart murmur    Past Surgical History:  Procedure Laterality Date  . VAGINAL DELIVERY     2  . WISDOM TOOTH EXTRACTION     Family History  Problem Relation Age of Onset  . Hypertension Mother   . Hyperlipidemia Mother     triglycerides  . Cancer Father     bile duct  . Heart disease Father 19   Social History  Substance Use Topics  . Smoking status: Never Smoker  . Smokeless tobacco: Never Used  . Alcohol use No     Comment: rare   Review of Systems  HENT:       Temporal pain/ear pain.  Respiratory: Positive for cough.   Musculoskeletal:       Hand pain/knuckle swelling.  Neurological: Positive for dizziness and headaches.  All other systems reviewed and are negative.  Objective:   Today's Vitals: BP 137/66 (BP Location: Right Arm, Patient Position: Sitting, Cuff Size: Normal)   Pulse 66   Temp 98 F (36.7 C) (Oral)   Ht _0  (1.651 m) Comment: with shoes  Wt 133 lb 8 oz (60.6 kg)   SpO2 100%   BMI 22.22 kg/m   Physical Exam  Constitutional: She is oriented to  person, place, and time. She appears well-developed. No distress.  HENT:  Head: Normocephalic and atraumatic.  Mouth/Throat: Oropharynx is clear and moist.  Normal TM's bilaterally.   Eyes: Conjunctivae are normal.  Neck: Neck supple.  Cardiovascular: Normal rate and regular rhythm.   No murmur appreciated.  Pulmonary/Chest: Effort normal and breath sounds normal.  Abdominal: Soft. She exhibits no distension. There is no tenderness. There is no rebound and no guarding.  Musculoskeletal: Normal range of motion. She exhibits no edema.  Lymphadenopathy:    She has no cervical adenopathy.  Neurological: She is alert and oriented to person, place, and time.  Skin: Skin is warm and dry. No rash noted.  Psychiatric: She has a normal mood and affect.  Vitals reviewed.  Assessment & Plan:   Problem List Items Addressed This Visit    Benign essential HTN   Relevant Orders   Comp Met (CMET)   Well woman exam without gynecological exam - Primary    Pap smear and mammogram up-to-date. Screening labs today. Immunizations up-to-date. Advised regular exercise.       Other Visit Diagnoses    Screening for deficiency anemia       Relevant Orders   CBC   Elevated  glucose       Relevant Orders   HgB A1c   Screening, lipid       Relevant Orders   Lipid Profile     Outpatient Encounter Prescriptions as of 12/14/2015  Medication Sig  . fluticasone (FLONASE) 50 MCG/ACT nasal spray Place 2 sprays into the nose daily.  . hydrochlorothiazide (HYDRODIURIL) 12.5 MG tablet Take 1 tablet (12.5 mg total) by mouth daily.  Marland Kitchen levonorgestrel-ethinyl estradiol (NORDETTE) 0.15-30 MG-MCG tablet Take 1 tablet by mouth daily.  Marland Kitchen lisinopril (PRINIVIL,ZESTRIL) 5 MG tablet Take 1 tablet (5 mg total) by mouth daily.  Marland Kitchen omeprazole (PRILOSEC) 20 MG capsule Take 20 mg by mouth daily as needed.    No facility-administered encounter medications on file as of 12/14/2015.    Follow-up: Annually.   [place]

## 2015-12-14 NOTE — Patient Instructions (Signed)
You're doing well.  Try and exercise.  Follow up annually.  Take care  Dr. Lacinda Axon   Health Maintenance, Female Adopting a healthy lifestyle and getting preventive care can go a long way to promote health and wellness. Talk with your health care provider about what schedule of regular examinations is right for you. This is a good chance for you to check in with your provider about disease prevention and staying healthy. In between checkups, there are plenty of things you can do on your own. Experts have done a lot of research about which lifestyle changes and preventive measures are most likely to keep you healthy. Ask your health care provider for more information. WEIGHT AND DIET  Eat a healthy diet  Be sure to include plenty of vegetables, fruits, low-fat dairy products, and lean protein.  Do not eat a lot of foods high in solid fats, added sugars, or salt.  Get regular exercise. This is one of the most important things you can do for your health.  Most adults should exercise for at least 150 minutes each week. The exercise should increase your heart rate and make you sweat (moderate-intensity exercise).  Most adults should also do strengthening exercises at least twice a week. This is in addition to the moderate-intensity exercise.  Maintain a healthy weight  Body mass index (BMI) is a measurement that can be used to identify possible weight problems. It estimates body fat based on height and weight. Your health care provider can help determine your BMI and help you achieve or maintain a healthy weight.  For females 47 years of age and older:   A BMI below 18.5 is considered underweight.  A BMI of 18.5 to 24.9 is normal.  A BMI of 25 to 29.9 is considered overweight.  A BMI of 30 and above is considered obese.  Watch levels of cholesterol and blood lipids  You should start having your blood tested for lipids and cholesterol at 48 years of age, then have this test every 5  years.  You may need to have your cholesterol levels checked more often if:  Your lipid or cholesterol levels are high.  You are older than 48 years of age.  You are at high risk for heart disease.  CANCER SCREENING   Lung Cancer  Lung cancer screening is recommended for adults 34-35 years old who are at high risk for lung cancer because of a history of smoking.  A yearly low-dose CT scan of the lungs is recommended for people who:  Currently smoke.  Have quit within the past 15 years.  Have at least a 30-pack-year history of smoking. A pack year is smoking an average of one pack of cigarettes a day for 1 year.  Yearly screening should continue until it has been 15 years since you quit.  Yearly screening should stop if you develop a health problem that would prevent you from having lung cancer treatment.  Breast Cancer  Practice breast self-awareness. This means understanding how your breasts normally appear and feel.  It also means doing regular breast self-exams. Let your health care provider know about any changes, no matter how small.  If you are in your 20s or 30s, you should have a clinical breast exam (CBE) by a health care provider every 1-3 years as part of a regular health exam.  If you are 77 or older, have a CBE every year. Also consider having a breast X-ray (mammogram) every year.  If you  have a family history of breast cancer, talk to your health care provider about genetic screening.  If you are at high risk for breast cancer, talk to your health care provider about having an MRI and a mammogram every year.  Breast cancer gene (BRCA) assessment is recommended for women who have family members with BRCA-related cancers. BRCA-related cancers include:  Breast.  Ovarian.  Tubal.  Peritoneal cancers.  Results of the assessment will determine the need for genetic counseling and BRCA1 and BRCA2 testing. Cervical Cancer Your health care provider may  recommend that you be screened regularly for cancer of the pelvic organs (ovaries, uterus, and vagina). This screening involves a pelvic examination, including checking for microscopic changes to the surface of your cervix (Pap test). You may be encouraged to have this screening done every 3 years, beginning at age 50.  For women ages 72-65, health care providers may recommend pelvic exams and Pap testing every 3 years, or they may recommend the Pap and pelvic exam, combined with testing for human papilloma virus (HPV), every 5 years. Some types of HPV increase your risk of cervical cancer. Testing for HPV may also be done on women of any age with unclear Pap test results.  Other health care providers may not recommend any screening for nonpregnant women who are considered low risk for pelvic cancer and who do not have symptoms. Ask your health care provider if a screening pelvic exam is right for you.  If you have had past treatment for cervical cancer or a condition that could lead to cancer, you need Pap tests and screening for cancer for at least 20 years after your treatment. If Pap tests have been discontinued, your risk factors (such as having a new sexual partner) need to be reassessed to determine if screening should resume. Some women have medical problems that increase the chance of getting cervical cancer. In these cases, your health care provider may recommend more frequent screening and Pap tests. Colorectal Cancer  This type of cancer can be detected and often prevented.  Routine colorectal cancer screening usually begins at 48 years of age and continues through 48 years of age.  Your health care provider may recommend screening at an earlier age if you have risk factors for colon cancer.  Your health care provider may also recommend using home test kits to check for hidden blood in the stool.  A small camera at the end of a tube can be used to examine your colon directly  (sigmoidoscopy or colonoscopy). This is done to check for the earliest forms of colorectal cancer.  Routine screening usually begins at age 43.  Direct examination of the colon should be repeated every 5-10 years through 48 years of age. However, you may need to be screened more often if early forms of precancerous polyps or small growths are found. Skin Cancer  Check your skin from head to toe regularly.  Tell your health care provider about any new moles or changes in moles, especially if there is a change in a mole's shape or color.  Also tell your health care provider if you have a mole that is larger than the size of a pencil eraser.  Always use sunscreen. Apply sunscreen liberally and repeatedly throughout the day.  Protect yourself by wearing long sleeves, pants, a wide-brimmed hat, and sunglasses whenever you are outside. HEART DISEASE, DIABETES, AND HIGH BLOOD PRESSURE   High blood pressure causes heart disease and increases the risk of stroke.  High blood pressure is more likely to develop in:  People who have blood pressure in the high end of the normal range (130-139/85-89 mm Hg).  People who are overweight or obese.  People who are African American.  If you are 42-104 years of age, have your blood pressure checked every 3-5 years. If you are 60 years of age or older, have your blood pressure checked every year. You should have your blood pressure measured twice--once when you are at a hospital or clinic, and once when you are not at a hospital or clinic. Record the average of the two measurements. To check your blood pressure when you are not at a hospital or clinic, you can use:  An automated blood pressure machine at a pharmacy.  A home blood pressure monitor.  If you are between 18 years and 59 years old, ask your health care provider if you should take aspirin to prevent strokes.  Have regular diabetes screenings. This involves taking a blood sample to check your  fasting blood sugar level.  If you are at a normal weight and have a low risk for diabetes, have this test once every three years after 48 years of age.  If you are overweight and have a high risk for diabetes, consider being tested at a younger age or more often. PREVENTING INFECTION  Hepatitis B  If you have a higher risk for hepatitis B, you should be screened for this virus. You are considered at high risk for hepatitis B if:  You were born in a country where hepatitis B is common. Ask your health care provider which countries are considered high risk.  Your parents were born in a high-risk country, and you have not been immunized against hepatitis B (hepatitis B vaccine).  You have HIV or AIDS.  You use needles to inject street drugs.  You live with someone who has hepatitis B.  You have had sex with someone who has hepatitis B.  You get hemodialysis treatment.  You take certain medicines for conditions, including cancer, organ transplantation, and autoimmune conditions. Hepatitis C  Blood testing is recommended for:  Everyone born from [birth year] through 1963.  Anyone with known risk factors for hepatitis C. Sexually transmitted infections (STIs)  You should be screened for sexually transmitted infections (STIs) including gonorrhea and chlamydia if:  You are sexually active and are younger than 48 years of age.  You are older than 48 years of age and your health care provider tells you that you are at risk for this type of infection.  Your sexual activity has changed since you were last screened and you are at an increased risk for chlamydia or gonorrhea. Ask your health care provider if you are at risk.  If you do not have HIV, but are at risk, it may be recommended that you take a prescription medicine daily to prevent HIV infection. This is called pre-exposure prophylaxis (PrEP). You are considered at risk if:  You are sexually active and do not regularly use condoms or  know the HIV status of your partner(s).  You take drugs by injection.  You are sexually active with a partner who has HIV. Talk with your health care provider about whether you are at high risk of being infected with HIV. If you choose to begin PrEP, you should first be tested for HIV. You should then be tested every 3 months for as long as you are taking PrEP.  PREGNANCY   If you  are premenopausal and you may become pregnant, ask your health care provider about preconception counseling.  If you may become pregnant, take 400 to 800 micrograms (mcg) of folic acid every day.  If you want to prevent pregnancy, talk to your health care provider about birth control (contraception). OSTEOPOROSIS AND MENOPAUSE   Osteoporosis is a disease in which the bones lose minerals and strength with aging. This can result in serious bone fractures. Your risk for osteoporosis can be identified using a bone density scan.  If you are 38 years of age or older, or if you are at risk for osteoporosis and fractures, ask your health care provider if you should be screened.  Ask your health care provider whether you should take a calcium or vitamin D supplement to lower your risk for osteoporosis.  Menopause may have certain physical symptoms and risks.  Hormone replacement therapy may reduce some of these symptoms and risks. Talk to your health care provider about whether hormone replacement therapy is right for you.  HOME CARE INSTRUCTIONS   Schedule regular health, dental, and eye exams.  Stay current with your immunizations.   Do not use any tobacco products including cigarettes, chewing tobacco, or electronic cigarettes.  If you are pregnant, do not drink alcohol.  If you are breastfeeding, limit how much and how often you drink alcohol.  Limit alcohol intake to no more than 1 drink per day for nonpregnant women. One drink equals 12 ounces of beer, 5 ounces of wine, or 1 ounces of hard liquor.  Do  not use street drugs.  Do not share needles.  Ask your health care provider for help if you need support or information about quitting drugs.  Tell your health care provider if you often feel depressed.  Tell your health care provider if you have ever been abused or do not feel safe at home.   This information is not intended to replace advice given to you by your health care provider. Make sure you discuss any questions you have with your health care provider.   Document Released: 11/01/2010 Document Revised: 05/09/2014 Document Reviewed: 03/20/2013 Elsevier Interactive Patient Education [employer].

## 2016-02-13 ENCOUNTER — Ambulatory Visit (INDEPENDENT_AMBULATORY_CARE_PROVIDER_SITE_OTHER): Payer: BC Managed Care – PPO

## 2016-02-13 DIAGNOSIS — Z23 Encounter for immunization: Secondary | ICD-10-CM | POA: Diagnosis not present

## 2016-02-19 ENCOUNTER — Ambulatory Visit (INDEPENDENT_AMBULATORY_CARE_PROVIDER_SITE_OTHER): Payer: BC Managed Care – PPO | Admitting: Family Medicine

## 2016-02-19 ENCOUNTER — Encounter: Payer: Self-pay | Admitting: Family Medicine

## 2016-02-19 DIAGNOSIS — M25432 Effusion, left wrist: Secondary | ICD-10-CM

## 2016-02-19 NOTE — Progress Notes (Signed)
Pre visit review using our clinic review tool, if applicable. No additional management support is needed unless otherwise documented below in the visit note. 

## 2016-02-21 DIAGNOSIS — M25432 Effusion, left wrist: Secondary | ICD-10-CM | POA: Insufficient documentation

## 2016-02-21 NOTE — Progress Notes (Signed)
   Subjective:  Patient ID: Erika Daugherty, female    DOB: 14-Mar-1966  Age: 50 y.o. MRN: [id number]  CC: Left wrist/forearm swelling  HPI:  50 year old female presents with the above complaints.  Patient states that yesterday she noticed some swelling around the ulnar aspect of her left wrist/proximal forearm. She reports that she had a minimal amount of pain when she put pressure on the area. No known inciting factor. No recent fall, trauma, injury. She does note that she has been doing some lifting in preparation for yard sale. This may be concerning. No associated redness. No other associated symptoms. No other complaints at this time.  Social Hx   Social History   Social History  . Marital status: Married    Spouse name: N/A  . Number of children: N/A  . Years of education: N/A   Social History Main Topics  . Smoking status: Never Smoker  . Smokeless tobacco: Never Used  . Alcohol use No     Comment: rare  . Drug use: No  . Sexual activity: Not Asked   Other Topics Concern  . None   Social History Narrative   Lives in [place], [place] with husband and two children (16YO, 11YO) and dog.      Work - Arboriculturist   Diet - relative healthy   Exercise - walk or elliptical 53min 5 days per week    Review of Systems  Constitutional: Negative.   Musculoskeletal:       Swelling of ulnar aspect of Left wrist.   Objective:  BP 138/86 (BP Location: Right Arm, Patient Position: Sitting, Cuff Size: Normal)   Pulse 73   Temp 97.9 F (36.6 C) (Oral)   Wt 132 lb (59.9 kg)   SpO2 98%   BMI 21.97 kg/m   BP/Weight 02/19/2016 12/14/2015 [id number]  Systolic BP 0000000 0000000 [id number]  Diastolic BP 86 66 86  Wt. (Lbs) 132 133.5 131.4  BMI 21.97 22.22 22.91   Physical Exam  Constitutional: She is oriented to person, place, and time. She appears well-developed. No distress.  Pulmonary/Chest: Effort normal.  Musculoskeletal:  Mild swelling noted at the ulnar aspect of the proximal  forearm/ulnar styloid. Normal range of motion of the wrist. Nontender to palpation.  Neurological: She is alert and oriented to person, place, and time.  Psychiatric: She has a normal mood and affect.  Vitals reviewed.  Lab Results  Component Value Date   WBC 4.5 12/14/2015   HGB 13.1 12/14/2015   HCT 37.7 12/14/2015   PLT 230.0 12/14/2015   GLUCOSE 91 12/14/2015   CHOL 151 12/14/2015   TRIG 135.0 12/14/2015   HDL 48.10 12/14/2015   LDLCALC 76 12/14/2015   ALT 12 12/14/2015   AST 13 12/14/2015   NA 139 12/14/2015   K 3.7 12/14/2015   CL 103 12/14/2015   CREATININE 0.83 12/14/2015   BUN 14 12/14/2015   CO2 30 12/14/2015   TSH 4.42 12/10/2014   HGBA1C 5.3 12/14/2015   MICROALBUR 0.8 11/05/2013   Assessment & Plan:   Problem List Items Addressed This Visit    Wrist swelling, left    New problem. Likely related to activity. No indication for imaging. Advised conservative care with ice, rest, elevation. Also advised use of over-the-counter ibuprofen or naproxen.       Other Visit Diagnoses   None.    Follow-up: PRN  [place]

## 2016-02-21 NOTE — Assessment & Plan Note (Signed)
New problem. Likely related to activity. No indication for imaging. Advised conservative care with ice, rest, elevation. Also advised use of over-the-counter ibuprofen or naproxen.

## 2016-07-01 ENCOUNTER — Other Ambulatory Visit: Payer: Self-pay | Admitting: Family Medicine

## 2016-07-01 MED ORDER — HYDROCHLOROTHIAZIDE 12.5 MG PO TABS: 12.5000 mg | ORAL_TABLET | Freq: Every day | ORAL | 3 refills | Status: DC

## 2016-07-01 NOTE — Telephone Encounter (Signed)
No documentation in note about staying on this medication.  Last OV:12/14/15 Future OV:12/15/16  Please advise?

## 2016-07-15 ENCOUNTER — Other Ambulatory Visit: Payer: Self-pay | Admitting: Family Medicine

## 2016-07-15 MED ORDER — LISINOPRIL 5 MG PO TABS: 5.0000 mg | ORAL_TABLET | Freq: Every day | ORAL | 3 refills | Status: DC

## 2016-12-06 LAB — HM MAMMOGRAPHY

## 2016-12-15 ENCOUNTER — Encounter: Payer: BC Managed Care – PPO | Admitting: Family Medicine

## 2016-12-16 ENCOUNTER — Ambulatory Visit (INDEPENDENT_AMBULATORY_CARE_PROVIDER_SITE_OTHER): Payer: BC Managed Care – PPO | Admitting: Family Medicine

## 2016-12-16 ENCOUNTER — Encounter: Payer: Self-pay | Admitting: Family Medicine

## 2016-12-16 VITALS — BP 130/80 | HR 73 | Temp 98.1°F | Resp 16 | Ht 64.5 in | Wt 133.4 lb

## 2016-12-16 DIAGNOSIS — I10 Essential (primary) hypertension: Secondary | ICD-10-CM | POA: Diagnosis not present

## 2016-12-16 DIAGNOSIS — Z130 Encounter for screening for diseases of the blood and blood-forming organs and certain disorders involving the immune mechanism: Secondary | ICD-10-CM

## 2016-12-16 DIAGNOSIS — Z0000 Encounter for general adult medical examination without abnormal findings: Secondary | ICD-10-CM | POA: Insufficient documentation

## 2016-12-16 DIAGNOSIS — Z13220 Encounter for screening for lipoid disorders: Secondary | ICD-10-CM | POA: Diagnosis not present

## 2016-12-16 LAB — CBC
HCT: 39.2 % (ref 36.0–46.0)
Hemoglobin: 13.4 g/dL (ref 12.0–15.0)
MCHC: 34.2 g/dL (ref 30.0–36.0)
MCV: 94 fl (ref 78.0–100.0)
Platelets: 262 K/uL (ref 150.0–400.0)
RBC: 4.17 Mil/uL (ref 3.87–5.11)
RDW: 12.2 % (ref 11.5–15.5)
WBC: 4.2 K/uL (ref 4.0–10.5)

## 2016-12-16 LAB — LIPID PANEL
CHOLESTEROL: 139 mg/dL (ref 0–200)
HDL: 45 mg/dL (ref >39.00)
LDL Cholesterol: 72 mg/dL (ref 0–99)
NONHDL: 94.26
Total CHOL/HDL Ratio: 3
Triglycerides: 112 mg/dL (ref 0.0–149.0)
VLDL: 22.4 mg/dL (ref 0.0–40.0)

## 2016-12-16 LAB — COMPREHENSIVE METABOLIC PANEL WITH GFR
ALT: 12 U/L (ref 0–35)
AST: 16 U/L (ref 0–37)
Albumin: 3.7 g/dL (ref 3.5–5.2)
Alkaline Phosphatase: 50 U/L (ref 39–117)
BUN: 18 mg/dL (ref 6–23)
CO2: 30 meq/L (ref 19–32)
Calcium: 9.1 mg/dL (ref 8.4–10.5)
Chloride: 103 meq/L (ref 96–112)
Creatinine, Ser: 0.85 mg/dL (ref 0.40–1.20)
GFR: 75.42 mL/min (ref >60.00)
Glucose, Bld: 89 mg/dL (ref 70–99)
Potassium: 3.2 meq/L — ABNORMAL LOW (ref 3.5–5.1)
Sodium: 139 meq/L (ref 135–145)
Total Bilirubin: 0.5 mg/dL (ref 0.2–1.2)
Total Protein: 7.5 g/dL (ref 6.0–8.3)

## 2016-12-16 MED ORDER — HYDROCHLOROTHIAZIDE 12.5 MG PO TABS: 12.5000 mg | ORAL_TABLET | Freq: Every day | ORAL | 3 refills | Status: DC

## 2016-12-16 NOTE — Progress Notes (Signed)
Subjective:  Patient ID: Erika Daugherty, female    DOB: [birth year]  Age: 51 y.o. MRN: [id number]  CC: Annual exam  HPI Erika Daugherty is a 51 y.o. female presents to the clinic today for an annual exam.  Preventative Healthcare  Pap smear: Up to date.   Mammogram: Up to date.   Colonoscopy: Not yet indicated.  Immunizations: Up to date.   Labs: Labs today.  Alcohol use: Rare.  Smoking/tobacco use: No.  STD/HIV testing: Declines.  PMH, Surgical Hx, Family Hx, Social History reviewed and updated as below.  Past Medical History:  Diagnosis Date  . Allergy   . GERD (gastroesophageal reflux disease)   . Heart murmur    Past Surgical History:  Procedure Laterality Date  . VAGINAL DELIVERY     2  . WISDOM TOOTH EXTRACTION     Family History  Problem Relation Age of Onset  . Hypertension Mother   . Hyperlipidemia Mother        triglycerides  . Cancer Father        bile duct  . Heart disease Father 38   Social History  Substance Use Topics  . Smoking status: Never Smoker  . Smokeless tobacco: Never Used  . Alcohol use No     Comment: rare   Review of Systems General: Denies unexplained weight loss, fever. Skin: Denies new or changing mole, sore/wound that won't heal. ENT: Trouble hearing, ringing in the ears, sores in the mouth, hoarseness, trouble swallowing. Eyes: Denies trouble seeing/visual disturbance. Heart/CV: Denies chest pain, shortness of breath, edema, palpitations. Lungs/Resp: Denies cough, shortness of breath, hemoptysis. Abd/GI: Denies nausea, vomiting, diarrhea, constipation, abdominal pain, hematochezia, melena. GU: Denies dysuria, incontinence, hematuria, urinary frequency, difficulty starting/keeping stream, penile discharge, sexual difficulty, lump in testicles. MSK: Denies joint pain/swelling, myalgias. Neuro: Denies headaches, weakness, numbness, dizziness, syncope. Psych: Denies sadness, anxiety, stress, memory  difficulty. Endocrine: Denies polyuria and polydipsia.  Objective:   Today's Vitals: BP 130/80 (BP Location: Left Arm, Patient Position: Sitting, Cuff Size: Normal)   Pulse 73   Temp 98.1 F (36.7 C) (Oral)   Resp 16   Ht 5' 4.5" (1.638 m)   Wt 133 lb 6 oz (60.5 kg)   SpO2 97%   BMI 22.54 kg/m   Physical Exam  Constitutional: She is oriented to person, place, and time. She appears well-developed and well-nourished. No distress.  HENT:  Head: Normocephalic and atraumatic.  Nose: Nose normal.  Mouth/Throat: Oropharynx is clear and moist. No oropharyngeal exudate.  Normal TM's bilaterally.   Eyes: Conjunctivae are normal. No scleral icterus.  Neck: Neck supple.  Cardiovascular: Normal rate and regular rhythm.   No murmur heard. Pulmonary/Chest: Effort normal and breath sounds normal. She has no wheezes. She has no rales.  Abdominal: Soft. She exhibits no distension. There is no tenderness. There is no rebound and no guarding.  Musculoskeletal: Normal range of motion. She exhibits no edema.  Lymphadenopathy:    She has no cervical adenopathy.  Neurological: She is alert and oriented to person, place, and time.  Skin: Skin is warm and dry. No rash noted.  Psychiatric: She has a normal mood and affect.  Vitals reviewed.  Assessment & Plan:   Problem List Items Addressed This Visit    Annual physical exam - Primary    Declines HIV screening.  Immunizations up to date.  Labs today. Doing well.        Other Visit Diagnoses    Screening for  deficiency anemia       Relevant Orders   CBC   Screening, lipid       Relevant Orders   Lipid panel   Essential hypertension       Relevant Medications   hydrochlorothiazide (HYDRODIURIL) 12.5 MG tablet   Other Relevant Orders   Comprehensive metabolic panel     Meds ordered this encounter  Medications  . hydrochlorothiazide (HYDRODIURIL) 12.5 MG tablet    Sig: Take 1 tablet (12.5 mg total) by mouth daily.    Dispense:  90  tablet    Refill:  3   Follow-up: Annually  [place]

## 2016-12-16 NOTE — Assessment & Plan Note (Signed)
Declines HIV screening.  Immunizations up to date.  Labs today. Doing well.

## 2016-12-16 NOTE — Patient Instructions (Signed)

## 2016-12-27 ENCOUNTER — Telehealth: Payer: Self-pay | Admitting: Internal Medicine

## 2016-12-27 ENCOUNTER — Ambulatory Visit
Admission: EM | Admit: 2016-12-27 | Discharge: 2016-12-27 | Disposition: A | Discharge status: Home / Self Care | Payer: BC Managed Care – PPO | Attending: Family Medicine | Admitting: Family Medicine

## 2016-12-27 ENCOUNTER — Encounter: Payer: Self-pay | Admitting: *Deleted

## 2016-12-27 DIAGNOSIS — T783XXA Angioneurotic edema, initial encounter: Secondary | ICD-10-CM

## 2016-12-27 MED ORDER — DIPHENHYDRAMINE HCL 12.5 MG/5ML PO ELIX
25.0000 mg | ORAL_SOLUTION | Freq: Once | ORAL | Status: AC
  Administered 2016-12-27: 25 mg via ORAL

## 2016-12-27 MED ORDER — FAMOTIDINE 20 MG PO TABS
20.0000 mg | ORAL_TABLET | Freq: Once | ORAL | Status: AC
  Administered 2016-12-27: 20 mg via ORAL

## 2016-12-27 MED ORDER — DIPHENHYDRAMINE HCL 25 MG PO CAPS
25.0000 mg | ORAL_CAPSULE | Freq: Once | ORAL | Status: AC
  Administered 2016-12-27: 25 mg via ORAL

## 2016-12-27 MED ORDER — DIPHENHYDRAMINE HCL 12.5 MG/5ML PO ELIX: 25.0000 mg | ORAL_SOLUTION | Freq: Once | ORAL | Status: DC

## 2016-12-27 MED ORDER — PREDNISONE 20 MG PO TABS
40.0000 mg | ORAL_TABLET | Freq: Once | ORAL | Status: AC
  Administered 2016-12-27: 40 mg via ORAL

## 2016-12-27 NOTE — Telephone Encounter (Signed)
This is a pt of Dr Lacinda Axon.  She has an establish care appt with me on 12/22/17.  She will need an acute appt to f/u on her problems.  Can anyone here see her for an acute visit.  Let me know if a problem.

## 2016-12-27 NOTE — Telephone Encounter (Signed)
Pt called and stated that she went to Urgent care this morning for an allergic reaction to lisinopril. Pt states that her face and lips were swelling. Shwe was advised to stop taking the medication and to follow up with her PCP. Please advise, thank you!  Call pt @ [phone]

## 2016-12-27 NOTE — Discharge Instructions (Signed)
Stop lisinopril  Contact your Primary Care provider to discuss other anti-hypertensive options Go to Emergency Department if symptoms worsen

## 2016-12-27 NOTE — ED Triage Notes (Signed)
PAtient bumped her bottom lip on her car window this AM. Symptom of severe lower lip swelling is visible.

## 2016-12-27 NOTE — ED Provider Notes (Signed)
[facility]    CSN: [id number] Arrival date & time: 12/27/16  1251     History   Chief Complaint Chief Complaint  Patient presents with  . Oral Swelling    HPI Ronesha CHAYAH MCKEE is a 51 y.o. female.   51 yo female presents with a c/o generalized lower lip swelling since this morning about 3 hours prior to presentation. States that earlier in the morning she had bumped her bottom lip on her car window however did not have immediate swelling or any bleeding.   Patient denies any rash, wheezing, shortness of breath.   The history is provided by the patient.    Past Medical History:  Diagnosis Date  . Allergy   . GERD (gastroesophageal reflux disease)   . Heart murmur     Patient Active Problem List   Diagnosis Date Noted  . Annual physical exam 12/16/2016  . Benign essential HTN 06/17/2015  . Atypical nevi 10/17/2012    Past Surgical History:  Procedure Laterality Date  . VAGINAL DELIVERY     2  . WISDOM TOOTH EXTRACTION      OB History    No data available       Home Medications    Prior to Admission medications   Medication Sig Start Date End Date Taking? Authorizing Provider  levonorgestrel-ethinyl estradiol (NORDETTE) 0.15-30 MG-MCG tablet Take 1 tablet by mouth daily.   Yes [provider]  lisinopril (PRINIVIL,ZESTRIL) 5 MG tablet Take 1 tablet (5 mg total) by mouth daily. 07/15/16  Yes Cook, Jayce G, DO  omeprazole (PRILOSEC) 20 MG capsule Take 20 mg by mouth daily as needed.    Yes [provider]  hydrochlorothiazide (HYDRODIURIL) 12.5 MG tablet Take 1 tablet (12.5 mg total) by mouth daily. 12/16/16   Coral Spikes, DO    Family History Family History  Problem Relation Age of Onset  . Hypertension Mother   . Hyperlipidemia Mother        triglycerides  . Cancer Father        bile duct  . Heart disease Father 1    Social History Social History  Substance Use Topics  . Smoking status: Never Smoker  .  Smokeless tobacco: Never Used  . Alcohol use No     Comment: rare     Allergies   Erythromycin and Prednisone   Review of Systems Review of Systems   Physical Exam Triage Vital Signs ED Triage Vitals  Enc Vitals Group     BP 12/27/16 1301 (!) 152/81     Pulse Rate 12/27/16 1301 77     Resp 12/27/16 1301 16     Temp 12/27/16 1301 98.6 F (37 C)     Temp Source 12/27/16 1301 Oral     SpO2 12/27/16 1301 100 %     Weight --      Height --      Head Circumference --      Peak Flow --      Pain Score 12/27/16 1302 0     Pain Loc --      Pain Edu? --      Excl. in [place]? --    No data found.   Updated Vital Signs BP (!) 152/81 (BP Location: Left Arm)   Pulse 77   Temp 98.6 F (37 C) (Oral)   Resp 16   LMP 11/26/2016   SpO2 100%   Visual Acuity Right Eye Distance:   Left Eye  Distance:   Bilateral Distance:    Right Eye Near:   Left Eye Near:    Bilateral Near:     Physical Exam  Constitutional: She appears well-developed and well-nourished. No distress.  HENT:  Mouth/Throat: Oral lesions: marked diffuse tense edema on lower lip and extending into the mandible area (left greater than right)  No oropharyngeal exudate, posterior oropharyngeal edema or posterior oropharyngeal erythema. No tonsillar exudate.  Neck: Neck supple.  Cardiovascular: Normal rate, regular rhythm and normal heart sounds.   Pulmonary/Chest: Effort normal and breath sounds normal. No stridor. No respiratory distress. She has no wheezes. She has no rales.  Skin: She is not diaphoretic.  Nursing note and vitals reviewed.    UC Treatments / Results  Labs (all labs ordered are listed, but only abnormal results are displayed) Labs Reviewed - No data to display  EKG  EKG Interpretation None       Radiology No results found.  Procedures Procedures (including critical care time)  Medications Ordered in UC Medications  famotidine (PEPCID) tablet 20 mg (20 mg Oral Given 12/27/16  1320)  diphenhydrAMINE (BENADRYL) capsule 25 mg (25 mg Oral Given 12/27/16 1323)  predniSONE (DELTASONE) tablet 40 mg (40 mg Oral Given 12/27/16 1417)  diphenhydrAMINE (BENADRYL) 12.5 MG/5ML elixir 25 mg (25 mg Oral Given 12/27/16 1417)     Initial Impression / Assessment and Plan / UC Course  I have reviewed the triage vital signs and the nursing notes.  Pertinent labs & imaging results that were available during my care of the patient were reviewed by me and considered in my medical decision making (see chart for details).       Final Clinical Impressions(s) / UC Diagnoses   Final diagnoses:  Angioedema, initial encounter    New Prescriptions Discharge Medication List as of 12/27/2016  3:37 PM     1. diagnosis reviewed with patient (including reported cases of angioedema years after therapy initiated as well as after local trauma while taking ACE I) 2. Patient given benadryl 50mg  po, famotidine 20mg  po and prednisone 40mg  po with improvement of symptoms 3. Recommend discontinue lisinopril and follow up with PCP for alternative antihypertensive  med 4. To ED if symptoms worsen or recur  Controlled Substance Prescriptions [place] Controlled Substance Registry consulted? Not Applicable   Norval Gable, MD 12/27/16 [phone]

## 2016-12-27 NOTE — Telephone Encounter (Signed)
Patient seen [facility] ED for allergic reaction to lisinopril  Per ED given prednisone,  benadryl and Pepcid patient is better at this time but needs a ED follow up.

## 2016-12-28 ENCOUNTER — Encounter: Payer: Self-pay | Admitting: Internal Medicine

## 2016-12-28 ENCOUNTER — Telehealth: Payer: Self-pay | Admitting: Internal Medicine

## 2016-12-28 ENCOUNTER — Ambulatory Visit (INDEPENDENT_AMBULATORY_CARE_PROVIDER_SITE_OTHER): Payer: BC Managed Care – PPO | Admitting: Internal Medicine

## 2016-12-28 VITALS — BP 160/90 | HR 81 | Temp 98.9°F | Resp 14 | Wt 134.2 lb

## 2016-12-28 DIAGNOSIS — T783XXD Angioneurotic edema, subsequent encounter: Secondary | ICD-10-CM

## 2016-12-28 DIAGNOSIS — I10 Essential (primary) hypertension: Secondary | ICD-10-CM | POA: Diagnosis not present

## 2016-12-28 DIAGNOSIS — T783XXA Angioneurotic edema, initial encounter: Secondary | ICD-10-CM | POA: Insufficient documentation

## 2016-12-28 MED ORDER — METHYLPREDNISOLONE 4 MG PO TBPK: ORAL_TABLET | ORAL | 0 refills | Status: DC

## 2016-12-28 NOTE — Telephone Encounter (Signed)
Sent message for update.

## 2016-12-28 NOTE — Telephone Encounter (Signed)
PCP advised this has been arranged this morning with husband and PCP is aware.

## 2016-12-28 NOTE — Progress Notes (Signed)
Patient ID: Erika Daugherty, female   DOB: Dec 09, 1965, 51 y.o.   MRN: [id number]   Subjective:    Patient ID: Erika Daugherty, female    DOB: [birth year], 51 y.o.   MRN: [id number]  HPI  Patient here as a work in for urgent care follow up.  She was seen and diagnosed with angioedema.  Was given one dose of prednisone in the ER.  Was also given pepcid and benadryl.  She has been taking benadryl every 4 hours.  States she was driving to work and bent down and hit her lip on her window.  Noticed some lip swelling after that.  She then started having increased facial swelling and went to Urgent Care.  Diagnosed with angioedema.  Treated as above.  States that she has kept an ice pack on her face all night.  Lip swelling is some better, but the swelling is progressing and now having increased facial and peri orbital swelling.  No tongue swelling.  No breathing difficulty.  No sob or wheezing.  No nausea or vomiting.  Bowels normal.  No rash.     Past Medical History:  Diagnosis Date  . Allergy   . GERD (gastroesophageal reflux disease)   . Heart murmur    Past Surgical History:  Procedure Laterality Date  . VAGINAL DELIVERY     2  . WISDOM TOOTH EXTRACTION     Family History  Problem Relation Age of Onset  . Hypertension Mother   . Hyperlipidemia Mother        triglycerides  . Cancer Father        bile duct  . Heart disease Father 62   Social History   Social History  . Marital status: Married    Spouse name: N/A  . Number of children: N/A  . Years of education: N/A   Social History Main Topics  . Smoking status: Never Smoker  . Smokeless tobacco: Never Used  . Alcohol use No     Comment: rare  . Drug use: No  . Sexual activity: Not Asked   Other Topics Concern  . None   Social History Narrative   Lives in [place], [place] with husband and two children (16YO, 11YO) and dog.      Work - Arboriculturist   Diet - relative healthy   Exercise - walk or elliptical 59min 5 days  per week    Outpatient Encounter Prescriptions as of 12/28/2016  Medication Sig  . levonorgestrel-ethinyl estradiol (NORDETTE) 0.15-30 MG-MCG tablet Take 1 tablet by mouth daily.  . nitrofurantoin (MACRODANTIN) 50 MG capsule   . omeprazole (PRILOSEC) 20 MG capsule Take 20 mg by mouth daily as needed.   . hydrochlorothiazide (HYDRODIURIL) 12.5 MG tablet Take 1 tablet (12.5 mg total) by mouth daily. (Patient not taking: Reported on 12/28/2016)  . lisinopril (PRINIVIL,ZESTRIL) 5 MG tablet Take 1 tablet (5 mg total) by mouth daily. (Patient not taking: Reported on 12/28/2016)  . methylPREDNISolone (MEDROL DOSEPAK) 4 MG TBPK tablet Medrol dose pack - 6 day taper   No facility-administered encounter medications on file as of 12/28/2016.     Review of Systems  Constitutional: Negative for appetite change and fever.  HENT:       No tongue swelling.  Lip and facial swelling as outlined.   Respiratory: Negative for cough, chest tightness and shortness of breath.   Cardiovascular: Negative for chest pain, palpitations and leg swelling.  Gastrointestinal: Negative for diarrhea, nausea and vomiting.  Skin: Negative for color change and rash.  Neurological: Negative for dizziness and headaches.       Objective:     Blood pressure rechecked by me:  142/78  Physical Exam  Constitutional: She appears well-developed and well-nourished. No distress.  HENT:  Nose: Nose normal.  Mouth/Throat: Oropharynx is clear and moist.  No tongue swelling.  Lip swelling (improved- per pt).  Increased facial swelling and periorbital swelling.  No rash.  No neck swelling.  Uvula midline and no swelling.   Neck: Neck supple. No thyromegaly present.  Cardiovascular: Normal rate and regular rhythm.   Pulmonary/Chest: Breath sounds normal. No respiratory distress. She has no wheezes.  Musculoskeletal: She exhibits no edema or tenderness.  Lymphadenopathy:    She has no cervical adenopathy.  Skin: No rash noted.     BP (!) 160/90 (BP Location: Left Arm, Patient Position: Sitting, Cuff Size: Normal)   Pulse 81   Temp 98.9 F (37.2 C) (Oral)   Resp 14   Wt 134 lb 3.2 oz (60.9 kg)   LMP 11/26/2016   SpO2 98%   BMI 22.68 kg/m  Wt Readings from Last 3 Encounters:  12/28/16 134 lb 3.2 oz (60.9 kg)  12/16/16 133 lb 6 oz (60.5 kg)  02/19/16 132 lb (59.9 kg)     Lab Results  Component Value Date   WBC 4.2 12/16/2016   HGB 13.4 12/16/2016   HCT 39.2 12/16/2016   PLT 262.0 12/16/2016   GLUCOSE 89 12/16/2016   CHOL 139 12/16/2016   TRIG 112.0 12/16/2016   HDL 45.00 12/16/2016   LDLCALC 72 12/16/2016   ALT 12 12/16/2016   AST 16 12/16/2016   NA 139 12/16/2016   K 3.2 (L) 12/16/2016   CL 103 12/16/2016   CREATININE 0.85 12/16/2016   BUN 18 12/16/2016   CO2 30 12/16/2016   TSH 4.42 12/10/2014   HGBA1C 5.3 12/14/2015   MICROALBUR 0.8 11/05/2013       Assessment & Plan:   Problem List Items Addressed This Visit    Angioedema - Primary    Persistent symptoms as outlined.  No tongue swelling or sob.  Had one dose of prednisone yesterday.  Has had some increased heart rate with high dose prednisone in the past.  Will give her medrol dose pack - 6 day taper.  Have her start pepcid.  Will also start zyrtec in the am and benadryl in the evening.  Monitor closely.  Any worsening symptoms, she is to be evaluated.  Will need allergy evaluation given significant reaction.  Continue to hold ace inhibitor.        Relevant Orders   Ambulatory referral to Allergy   Benign essential HTN    Blood pressure on my recheck 142/78.  Hold on starting a new medication.  Follow blood pressure closely.            Einar Pheasant, MD

## 2016-12-28 NOTE — Progress Notes (Signed)
Pre-visit discussion using our clinic review tool. No additional management support is needed unless otherwise documented below in the visit note.  

## 2016-12-28 NOTE — Patient Instructions (Signed)
pepcid 20mg  twice a day  Zyrtec 10 mg in the am and benadryl if needed at night

## 2016-12-28 NOTE — Assessment & Plan Note (Signed)
Blood pressure on my recheck 142/78.  Hold on starting a new medication.  Follow blood pressure closely.

## 2016-12-28 NOTE — Telephone Encounter (Signed)
Pt left a message on office vm. She really needs an appt. Her husband has appt today and wanted to know if they could switch.

## 2016-12-28 NOTE — Assessment & Plan Note (Signed)
Persistent symptoms as outlined.  No tongue swelling or sob.  Had one dose of prednisone yesterday.  Has had some increased heart rate with high dose prednisone in the past.  Will give her medrol dose pack - 6 day taper.  Have her start pepcid.  Will also start zyrtec in the am and benadryl in the evening.  Monitor closely.  Any worsening symptoms, she is to be evaluated.  Will need allergy evaluation given significant reaction.  Continue to hold ace inhibitor.

## 2016-12-31 ENCOUNTER — Encounter: Payer: Self-pay | Admitting: Internal Medicine

## 2017-01-03 NOTE — Telephone Encounter (Signed)
See her my chart message.  Please call pt and inform her that I can see her Thursday 9:30 for f/u of her blood pressure.  Also, given ENT is evaluating her for the allergy, she can confirm planned f/u with them and then can cancel the appt if she desires.  Let me know if any other problems.

## 2017-01-03 NOTE — Telephone Encounter (Signed)
I would like for her to f/u with them on the blood they drew and labs ordered and to make sure they do not think anything more needs to be tested or done.  Also see if they will send [place] a copy of their note.  We referred.  Thanks.

## 2017-01-03 NOTE — Telephone Encounter (Signed)
She said there were supposed to call with results, haven't yet.  I will attempt to get labs from them. thanks

## 2017-01-03 NOTE — Telephone Encounter (Signed)
Spoke with the patient, she agreed to the follow up appt on Thursday, placed on the schedule.  She said that [facility] ENT did some labs, but didn't request a follow up appt, she wanted to know if you think she needs one?

## 2017-01-05 ENCOUNTER — Ambulatory Visit (INDEPENDENT_AMBULATORY_CARE_PROVIDER_SITE_OTHER): Payer: BC Managed Care – PPO | Admitting: Internal Medicine

## 2017-01-05 ENCOUNTER — Encounter: Payer: Self-pay | Admitting: Internal Medicine

## 2017-01-05 VITALS — BP 140/82 | HR 69 | Temp 97.8°F | Resp 16 | Wt 135.2 lb

## 2017-01-05 DIAGNOSIS — I10 Essential (primary) hypertension: Secondary | ICD-10-CM

## 2017-01-05 DIAGNOSIS — E876 Hypokalemia: Secondary | ICD-10-CM

## 2017-01-05 DIAGNOSIS — T783XXD Angioneurotic edema, subsequent encounter: Secondary | ICD-10-CM | POA: Diagnosis not present

## 2017-01-05 LAB — POTASSIUM: POTASSIUM: 3.6 meq/L (ref 3.5–5.1)

## 2017-01-05 NOTE — Progress Notes (Signed)
Patient ID: Erika Daugherty, female   DOB: [birth year], 51 y.o.   MRN: [id number]   Subjective:    Patient ID: Erika Daugherty, female    DOB: 11/13/1965, 51 y.o.   MRN: [id number]  HPI  Patient here for a scheduled follow up.  Was seen as a work in (and new pt) last week for facial swelling, ER follow up for angioedema.    She was on lisinopril at that time.  Reaction was felt to be related to this medication.  Treated with medrol dose pack, antihistamine and pepcid.  She saw ENT and is undergoing testing.  Swelling is better.  No headache.  No chest pain.  No sob.  Eating and drinking well.  No abdominal pain.  Was questioning need for another blood pressure medication.  Has hctz at home.    Past Medical History:  Diagnosis Date  . Allergy   . GERD (gastroesophageal reflux disease)   . Heart murmur    Past Surgical History:  Procedure Laterality Date  . VAGINAL DELIVERY     2  . WISDOM TOOTH EXTRACTION     Family History  Problem Relation Age of Onset  . Hypertension Mother   . Hyperlipidemia Mother        triglycerides  . Cancer Father        bile duct  . Heart disease Father 8   Social History   Social History  . Marital status: Married    Spouse name: N/A  . Number of children: N/A  . Years of education: N/A   Social History Main Topics  . Smoking status: Never Smoker  . Smokeless tobacco: Never Used  . Alcohol use No     Comment: rare  . Drug use: No  . Sexual activity: Not Asked   Other Topics Concern  . None   Social History Narrative   Lives in [place], [place] with husband and two children (16YO, 11YO) and dog.      Work - Arboriculturist   Diet - relative healthy   Exercise - walk or elliptical 14min 5 days per week    Outpatient Encounter Prescriptions as of 01/05/2017  Medication Sig  . cetirizine (ZYRTEC) 10 MG chewable tablet Chew 10 mg by mouth daily.  . DiphenhydrAMINE HCl (BENADRYL ALLERGY PO) Take 25 mg by mouth at bedtime.  . Famotidine  (PEPCID PO) Take by mouth.  . hydrochlorothiazide (HYDRODIURIL) 12.5 MG tablet Take 1 tablet (12.5 mg total) by mouth daily.  Marland Kitchen levonorgestrel-ethinyl estradiol (NORDETTE) 0.15-30 MG-MCG tablet Take 1 tablet by mouth daily.  . nitrofurantoin (MACRODANTIN) 50 MG capsule   . [DISCONTINUED] lisinopril (PRINIVIL,ZESTRIL) 5 MG tablet Take 1 tablet (5 mg total) by mouth daily. (Patient not taking: Reported on 12/28/2016)  . [DISCONTINUED] methylPREDNISolone (MEDROL DOSEPAK) 4 MG TBPK tablet Medrol dose pack - 6 day taper  . [DISCONTINUED] omeprazole (PRILOSEC) 20 MG capsule Take 20 mg by mouth daily as needed.    No facility-administered encounter medications on file as of 01/05/2017.     Review of Systems  Constitutional: Negative for appetite change and unexpected weight change.  HENT: Negative for congestion and sinus pressure.   Respiratory: Negative for cough, chest tightness and shortness of breath.   Cardiovascular: Negative for chest pain, palpitations and leg swelling.  Gastrointestinal: Negative for abdominal pain, nausea and vomiting.  Skin: Negative for color change and rash.  Neurological: Negative for dizziness and headaches.  Psychiatric/Behavioral: Negative for agitation and dysphoric mood.  Objective:    Physical Exam  Constitutional: She appears well-developed and well-nourished. No distress.  HENT:  Nose: Nose normal.  Mouth/Throat: Oropharynx is clear and moist.  Neck: Neck supple. No thyromegaly present.  Cardiovascular: Normal rate and regular rhythm.   Pulmonary/Chest: Breath sounds normal. No respiratory distress. She has no wheezes.  Abdominal: Soft. Bowel sounds are normal. There is no tenderness.  Musculoskeletal: She exhibits no edema or tenderness.  Lymphadenopathy:    She has no cervical adenopathy.  Skin: No rash noted. No erythema.  Psychiatric: She has a normal mood and affect. Her behavior is normal.    BP 140/82 (BP Location: Right Arm, Patient  Position: Sitting, Cuff Size: Normal)   Pulse 69   Temp 97.8 F (36.6 C) (Oral)   Resp 16   Wt 135 lb 3.2 oz (61.3 kg)   SpO2 98%   BMI 22.85 kg/m  Wt Readings from Last 3 Encounters:  01/05/17 135 lb 3.2 oz (61.3 kg)  12/28/16 134 lb 3.2 oz (60.9 kg)  12/16/16 133 lb 6 oz (60.5 kg)     Lab Results  Component Value Date   WBC 4.2 12/16/2016   HGB 13.4 12/16/2016   HCT 39.2 12/16/2016   PLT 262.0 12/16/2016   GLUCOSE 89 12/16/2016   CHOL 139 12/16/2016   TRIG 112.0 12/16/2016   HDL 45.00 12/16/2016   LDLCALC 72 12/16/2016   ALT 12 12/16/2016   AST 16 12/16/2016   NA 139 12/16/2016   K 3.6 01/05/2017   CL 103 12/16/2016   CREATININE 0.85 12/16/2016   BUN 18 12/16/2016   CO2 30 12/16/2016   TSH 4.42 12/10/2014   HGBA1C 5.3 12/14/2015   MICROALBUR 0.8 11/05/2013       Assessment & Plan:   Problem List Items Addressed This Visit    Angioedema    Ace inhibitor stopped.  Treated with prednisone, antihistamine and pepcid.  Doing better.  Saw ENT.  Testing in progress.  Remain off ace inhibitor.        Benign essential HTN    Blood pressure slightly elevated.  Will check potassium.  If ok, restart hctz 12.5mg  q day.  She feels her previous potassium check was related to increased sweating, etc.         Other Visit Diagnoses    Hypokalemia    -  Primary   Relevant Orders   Potassium (Completed)       Einar Pheasant, MD

## 2017-01-06 ENCOUNTER — Other Ambulatory Visit: Payer: Self-pay | Admitting: Internal Medicine

## 2017-01-06 DIAGNOSIS — I10 Essential (primary) hypertension: Secondary | ICD-10-CM

## 2017-01-06 DIAGNOSIS — E876 Hypokalemia: Secondary | ICD-10-CM

## 2017-01-06 NOTE — Progress Notes (Signed)
Order placed for f/u met b 

## 2017-01-07 NOTE — Assessment & Plan Note (Signed)
Ace inhibitor stopped.  Treated with prednisone, antihistamine and pepcid.  Doing better.  Saw ENT.  Testing in progress.  Remain off ace inhibitor.

## 2017-01-07 NOTE — Assessment & Plan Note (Signed)
Blood pressure slightly elevated.  Will check potassium.  If ok, restart hctz 12.5mg  q day.  She feels her previous potassium check was related to increased sweating, etc.

## 2017-01-19 NOTE — Addendum Note (Signed)
Addended by: Leeanne Rio on: 01/19/2017 04:05 PM   Modules accepted: Orders

## 2017-01-20 ENCOUNTER — Other Ambulatory Visit (INDEPENDENT_AMBULATORY_CARE_PROVIDER_SITE_OTHER): Payer: BC Managed Care – PPO

## 2017-01-20 DIAGNOSIS — I10 Essential (primary) hypertension: Secondary | ICD-10-CM

## 2017-01-20 DIAGNOSIS — E876 Hypokalemia: Secondary | ICD-10-CM

## 2017-01-20 LAB — BASIC METABOLIC PANEL
BUN: 16 mg/dL (ref 6–23)
CALCIUM: 9.5 mg/dL (ref 8.4–10.5)
CO2: 31 mEq/L (ref 19–32)
Chloride: 98 mEq/L (ref 96–112)
Creatinine, Ser: 0.87 mg/dL (ref 0.40–1.20)
GFR: 73.39 mL/min (ref >60.00)
Glucose, Bld: 97 mg/dL (ref 70–99)
Potassium: 3.2 mEq/L — ABNORMAL LOW (ref 3.5–5.1)
SODIUM: 137 meq/L (ref 135–145)

## 2017-01-20 NOTE — Addendum Note (Signed)
Addended by: Arby Barrette on: 01/20/2017 03:48 PM   Modules accepted: Orders

## 2017-01-25 ENCOUNTER — Other Ambulatory Visit: Payer: Self-pay | Admitting: Internal Medicine

## 2017-01-25 MED ORDER — METOPROLOL SUCCINATE ER 25 MG PO TB24: 25.0000 mg | ORAL_TABLET | Freq: Every day | ORAL | 1 refills | Status: DC

## 2017-01-25 NOTE — Progress Notes (Signed)
rx for toprol XL sen in to [facility].

## 2017-02-03 ENCOUNTER — Telehealth: Payer: Self-pay | Admitting: Radiology

## 2017-02-03 ENCOUNTER — Other Ambulatory Visit: Payer: Self-pay | Admitting: Internal Medicine

## 2017-02-03 DIAGNOSIS — E876 Hypokalemia: Secondary | ICD-10-CM

## 2017-02-03 NOTE — Progress Notes (Unsigned)
Order placed for f/u potassium.  

## 2017-02-03 NOTE — Telephone Encounter (Signed)
Order placed for f/u potassium.  

## 2017-02-03 NOTE — Telephone Encounter (Signed)
Pt coming in Monday for labs, please place future orders. Thank you.  

## 2017-02-06 ENCOUNTER — Other Ambulatory Visit (INDEPENDENT_AMBULATORY_CARE_PROVIDER_SITE_OTHER): Payer: BC Managed Care – PPO

## 2017-02-06 DIAGNOSIS — E876 Hypokalemia: Secondary | ICD-10-CM | POA: Diagnosis not present

## 2017-02-06 LAB — POTASSIUM: Potassium: 3.3 mEq/L — ABNORMAL LOW (ref 3.5–5.1)

## 2017-02-07 ENCOUNTER — Other Ambulatory Visit: Payer: Self-pay | Admitting: Internal Medicine

## 2017-02-07 DIAGNOSIS — E876 Hypokalemia: Secondary | ICD-10-CM

## 2017-02-07 NOTE — Progress Notes (Signed)
Order placed for f/u potassium check.  

## 2017-02-12 ENCOUNTER — Encounter: Payer: Self-pay | Admitting: Internal Medicine

## 2017-02-13 NOTE — Telephone Encounter (Signed)
Notify pt that since her blood pressure is remaining elevated and given she previously responded to hctz, we can try triam/hctz 37.5/25 1/2 tablet per day.  This medication will work like hctz, but has medication to make her hang on to potassium.  Will will f/u with lab check at her upcoming appt (will recheck potassium and kidney function at appt).  Will need rx sent in for triam/hctz if agreeable to start.

## 2017-02-14 MED ORDER — TRIAMTERENE-HCTZ 37.5-25 MG PO TABS: ORAL_TABLET | ORAL | 1 refills | Status: DC

## 2017-02-14 NOTE — Telephone Encounter (Signed)
rx sent in for pt.  Monitor blood pressure.  Let us know if any problems.  Will recheck potassium and kidney function at her upcoming appt.

## 2017-02-23 ENCOUNTER — Encounter: Payer: Self-pay | Admitting: Internal Medicine

## 2017-02-23 ENCOUNTER — Ambulatory Visit (INDEPENDENT_AMBULATORY_CARE_PROVIDER_SITE_OTHER): Payer: BC Managed Care – PPO | Admitting: Internal Medicine

## 2017-02-23 ENCOUNTER — Other Ambulatory Visit: Payer: Self-pay | Admitting: Internal Medicine

## 2017-02-23 VITALS — BP 124/86 | HR 70 | Temp 97.8°F | Resp 15 | Ht 64.57 in | Wt 134.6 lb

## 2017-02-23 DIAGNOSIS — I10 Essential (primary) hypertension: Secondary | ICD-10-CM | POA: Diagnosis not present

## 2017-02-23 DIAGNOSIS — R002 Palpitations: Secondary | ICD-10-CM

## 2017-02-23 DIAGNOSIS — R0981 Nasal congestion: Secondary | ICD-10-CM | POA: Diagnosis not present

## 2017-02-23 NOTE — Patient Instructions (Signed)
Saline nasal spray - flush nose at least 2x/day  nasacort nasal spray - 2 sprays each nostril one time per day.  Do this in the evening.    Stop pepcid.   Start zantac (ranitidine) 150mg  - take 30 minutes before breakfast

## 2017-02-23 NOTE — Progress Notes (Signed)
Patient ID: GRACELYNNE BENEDICT, female   DOB: 12-Jun-1965, 51 y.o.   MRN: [id number]   Subjective:    Patient ID: LILYAN PRETE, female    DOB: 1965-09-15, 51 y.o.   MRN: [id number]  HPI  Patient here for a scheduled follow up.  Recently had angioedema.  Was on ace inhibitor.  Stopped.  Was placed on hctz.  Potassium low.  Changed to metoprolol given history of palpitations.  Metoprolol has helped palpitations.  Much improved.  Not a significant issue for her now.  Still with elevated blood pressure.  Was started on triam/hctz 1/2 tablet per day.  Just started.  Tolerating.  No chest pain.  No sob.  No acid reflux.  No abdominal pain.  No further swelling.  Does report some nasal congestion/pressure and increased drainage.     Past Medical History:  Diagnosis Date  . Allergy   . GERD (gastroesophageal reflux disease)   . Heart murmur    Past Surgical History:  Procedure Laterality Date  . VAGINAL DELIVERY     2  . WISDOM TOOTH EXTRACTION     Family History  Problem Relation Age of Onset  . Hypertension Mother   . Hyperlipidemia Mother        triglycerides  . Cancer Father        bile duct  . Heart disease Father 16   Social History   Social History  . Marital status: Married    Spouse name: N/A  . Number of children: N/A  . Years of education: N/A   Social History Main Topics  . Smoking status: Never Smoker  . Smokeless tobacco: Never Used  . Alcohol use No     Comment: rare  . Drug use: No  . Sexual activity: Not Asked   Other Topics Concern  . None   Social History Narrative   Lives in [place], [place] with husband and two children (16YO, 11YO) and dog.      Work - Arboriculturist   Diet - relative healthy   Exercise - walk or elliptical 8min 5 days per week    Outpatient Encounter Prescriptions as of 02/23/2017  Medication Sig  . cetirizine (ZYRTEC) 10 MG chewable tablet Chew 10 mg by mouth daily.  . Famotidine (PEPCID PO) Take by mouth.  .  levonorgestrel-ethinyl estradiol (NORDETTE) 0.15-30 MG-MCG tablet Take 1 tablet by mouth daily.  . nitrofurantoin (MACRODANTIN) 50 MG capsule   . triamterene-hydrochlorothiazide (MAXZIDE-25) 37.5-25 MG tablet 1/2 tablet q day  . [DISCONTINUED] DiphenhydrAMINE HCl (BENADRYL ALLERGY PO) Take 25 mg by mouth at bedtime.  . [DISCONTINUED] metoprolol succinate (TOPROL XL) 25 MG 24 hr tablet Take 1 tablet (25 mg total) by mouth daily.   No facility-administered encounter medications on file as of 02/23/2017.     Review of Systems  Constitutional: Negative for appetite change and unexpected weight change.  HENT: Positive for congestion and postnasal drip.   Respiratory: Negative for cough, chest tightness and shortness of breath.   Cardiovascular: Negative for chest pain, palpitations and leg swelling.  Gastrointestinal: Negative for abdominal pain, diarrhea, nausea and vomiting.  Genitourinary: Negative for difficulty urinating and dysuria.  Musculoskeletal: Negative for back pain and joint swelling.  Skin: Negative for color change and rash.  Neurological: Negative for dizziness, light-headedness and headaches.  Psychiatric/Behavioral: Negative for agitation and dysphoric mood.       Objective:    Physical Exam  Constitutional: She appears well-developed and well-nourished. No distress.  HENT:  Nose: Nose normal.  Mouth/Throat: Oropharynx is clear and moist.  Neck: Neck supple. No thyromegaly present.  Cardiovascular: Normal rate and regular rhythm.   Pulmonary/Chest: Breath sounds normal. No respiratory distress. She has no wheezes.  Abdominal: Soft. Bowel sounds are normal. There is no tenderness.  Musculoskeletal: She exhibits no edema or tenderness.  Lymphadenopathy:    She has no cervical adenopathy.  Skin: No rash noted. No erythema.  Psychiatric: She has a normal mood and affect. Her behavior is normal.    BP 124/86 (BP Location: Left Arm, Patient Position: Sitting, Cuff  Size: Normal)   Pulse 70   Temp 97.8 F (36.6 C) (Oral)   Resp 15   Ht 5' 4.57" (1.64 m)   Wt 134 lb 9.6 oz (61.1 kg)   SpO2 99%   BMI 22.70 kg/m  Wt Readings from Last 3 Encounters:  02/23/17 134 lb 9.6 oz (61.1 kg)  01/05/17 135 lb 3.2 oz (61.3 kg)  12/28/16 134 lb 3.2 oz (60.9 kg)     Lab Results  Component Value Date   WBC 4.2 12/16/2016   HGB 13.4 12/16/2016   HCT 39.2 12/16/2016   PLT 262.0 12/16/2016   GLUCOSE 97 01/20/2017   CHOL 139 12/16/2016   TRIG 112.0 12/16/2016   HDL 45.00 12/16/2016   LDLCALC 72 12/16/2016   ALT 12 12/16/2016   AST 16 12/16/2016   NA 137 01/20/2017   K 3.3 (L) 02/06/2017   CL 98 01/20/2017   CREATININE 0.87 01/20/2017   BUN 16 01/20/2017   CO2 31 01/20/2017   TSH 4.42 12/10/2014   HGBA1C 5.3 12/14/2015   MICROALBUR 0.8 11/05/2013       Assessment & Plan:   Problem List Items Addressed This Visit    Benign essential HTN    Just started triam/hctz 1/2 table per day.  Blood pressure improved.  Will hold on adjusting medication.  Follow pressures.  Follow metabolic panel.  On metoprolol as well.  Continue same medication regimen.        Relevant Orders   Basic metabolic panel   Palpitations    Palpitations improved after starting metoprolol.  Doing well.  Follow.         Other Visit Diagnoses    Nasal congestion    -  Primary   Saline nasal spray and steroid nasal spray as outlined.  follow.  notify me if symptoms worsen or do not resolve.         Einar Pheasant, MD

## 2017-02-26 ENCOUNTER — Encounter: Payer: Self-pay | Admitting: Internal Medicine

## 2017-02-26 NOTE — Assessment & Plan Note (Signed)
Palpitations improved after starting metoprolol.  Doing well.  Follow.

## 2017-02-26 NOTE — Assessment & Plan Note (Signed)
Just started triam/hctz 1/2 table per day.  Blood pressure improved.  Will hold on adjusting medication.  Follow pressures.  Follow metabolic panel.  On metoprolol as well.  Continue same medication regimen.

## 2017-03-09 ENCOUNTER — Other Ambulatory Visit (INDEPENDENT_AMBULATORY_CARE_PROVIDER_SITE_OTHER): Payer: BC Managed Care – PPO

## 2017-03-09 DIAGNOSIS — I10 Essential (primary) hypertension: Secondary | ICD-10-CM | POA: Diagnosis not present

## 2017-03-09 LAB — BASIC METABOLIC PANEL
BUN: 18 mg/dL (ref 6–23)
CHLORIDE: 98 meq/L (ref 96–112)
CO2: 30 meq/L (ref 19–32)
Calcium: 9.6 mg/dL (ref 8.4–10.5)
Creatinine, Ser: 0.98 mg/dL (ref 0.40–1.20)
GFR: 63.94 mL/min (ref >60.00)
Glucose, Bld: 105 mg/dL — ABNORMAL HIGH (ref 70–99)
POTASSIUM: 3.3 meq/L — AB (ref 3.5–5.1)
Sodium: 134 mEq/L — ABNORMAL LOW (ref 135–145)

## 2017-03-10 ENCOUNTER — Telehealth: Payer: Self-pay | Admitting: Internal Medicine

## 2017-03-10 ENCOUNTER — Other Ambulatory Visit: Payer: Self-pay | Admitting: Internal Medicine

## 2017-03-10 DIAGNOSIS — E876 Hypokalemia: Secondary | ICD-10-CM

## 2017-03-10 NOTE — Telephone Encounter (Signed)
Called pt back, no answer left message for patient to return call  Copied from [facility] #[id number]. Topic: Quick Communication - Lab Results >> Mar 10, 2017  1:37 PM Yvette Rack wrote: Called patient to inform them of lab results. When patient returns call, triage nurse may disclose results.  patient states that she had gotten a call from Dr Nicki Reaper nurse it looks like JoEllen had called her about labs and she states that it is ok for triage nurse to give pt results I advised pt that someone will give her a call back about labs within 30 min

## 2017-03-10 NOTE — Progress Notes (Signed)
Order placed for f/u met b.   

## 2017-03-15 ENCOUNTER — Encounter: Payer: Self-pay | Admitting: Internal Medicine

## 2017-03-15 ENCOUNTER — Telehealth: Payer: Self-pay | Admitting: Internal Medicine

## 2017-03-15 NOTE — Telephone Encounter (Signed)
Copied from [facility] #[id number]. Topic: Inquiry >> Mar 15, 2017  1:51 PM Oliver Pila B wrote: Reason for CRM: PT was told to take amlodipine, she has contacted the pharmacy but they havent received anything yet on it, contact pt if needed or have pharmacy contact pt when ready

## 2017-03-16 MED ORDER — AMLODIPINE BESYLATE 5 MG PO TABS: 5.0000 mg | ORAL_TABLET | Freq: Every day | ORAL | 1 refills | Status: DC

## 2017-03-16 NOTE — Telephone Encounter (Signed)
Has been called in mychart message sent to patient.

## 2017-05-15 ENCOUNTER — Other Ambulatory Visit: Payer: Self-pay | Admitting: Internal Medicine

## 2017-05-16 ENCOUNTER — Ambulatory Visit: Payer: BC Managed Care – PPO | Admitting: Internal Medicine

## 2017-05-16 DIAGNOSIS — R002 Palpitations: Secondary | ICD-10-CM | POA: Diagnosis not present

## 2017-05-16 DIAGNOSIS — I10 Essential (primary) hypertension: Secondary | ICD-10-CM | POA: Diagnosis not present

## 2017-05-16 NOTE — Progress Notes (Signed)
Patient ID: Erika Daugherty, female   DOB: [birth year], 52 y.o.   MRN: [id number]   Subjective:    Patient ID: Erika Daugherty, female    DOB: 11-01-1965, 52 y.o.   MRN: [id number]  HPI  Patient here for a scheduled follow up.  States she is doing well.  Increased stress. Discussed with her today.  Overall she feels she is doing relatively well.  Does not feel she needs anything more at this time.  Blood pressure better.  No chest pain.  No sob.  No acid reflux.  No abdominal pain.  Bowels stable.  Taking her blood pressure medications.  No increased heart rate or palpitations on metoprolol.     Past Medical History:  Diagnosis Date  . Allergy   . GERD (gastroesophageal reflux disease)   . Heart murmur    Past Surgical History:  Procedure Laterality Date  . VAGINAL DELIVERY     2  . WISDOM TOOTH EXTRACTION     Family History  Problem Relation Age of Onset  . Hypertension Mother   . Hyperlipidemia Mother        triglycerides  . Cancer Father        bile duct  . Heart disease Father 53   Social History   Socioeconomic History  . Marital status: Married    Spouse name: None  . Number of children: None  . Years of education: None  . Highest education level: None  Social Needs  . Financial resource strain: None  . Food insecurity - worry: None  . Food insecurity - inability: None  . Transportation needs - medical: None  . Transportation needs - non-medical: None  Occupational History  . None  Tobacco Use  . Smoking status: Never Smoker  . Smokeless tobacco: Never Used  Substance and Sexual Activity  . Alcohol use: No    Comment: rare  . Drug use: No  . Sexual activity: None  Other Topics Concern  . None  Social History Narrative   Lives in [place], [place] with husband and two children (16YO, 11YO) and dog.      Work - Arboriculturist   Diet - relative healthy   Exercise - walk or elliptical 16min 5 days per week    Outpatient Encounter Medications as of  05/16/2017  Medication Sig  . amLODipine (NORVASC) 5 MG tablet Take 1 tablet (5 mg total) daily by mouth.  . cetirizine (ZYRTEC) 10 MG chewable tablet Chew 10 mg by mouth daily.  . Famotidine (PEPCID PO) Take by mouth.  . levonorgestrel-ethinyl estradiol (NORDETTE) 0.15-30 MG-MCG tablet Take 1 tablet by mouth daily.  . metoprolol succinate (TOPROL-XL) 25 MG 24 hr tablet TAKE 1 TABLET BY MOUTH EVERY DAY  . nitrofurantoin (MACRODANTIN) 50 MG capsule   . [DISCONTINUED] triamterene-hydrochlorothiazide (MAXZIDE-25) 37.5-25 MG tablet 1/2 tablet q day   No facility-administered encounter medications on file as of 05/16/2017.     Review of Systems  Constitutional: Negative for appetite change and unexpected weight change.  HENT: Negative for congestion and sinus pressure.   Respiratory: Negative for cough, chest tightness and shortness of breath.   Cardiovascular: Negative for chest pain, palpitations and leg swelling.  Gastrointestinal: Negative for abdominal pain, diarrhea, nausea and vomiting.  Genitourinary: Negative for difficulty urinating and dysuria.  Musculoskeletal: Negative for joint swelling and myalgias.  Skin: Negative for color change and rash.  Neurological: Negative for dizziness, light-headedness and headaches.  Psychiatric/Behavioral: Negative for agitation and dysphoric mood.  Objective:    Physical Exam  Constitutional: She appears well-developed and well-nourished. No distress.  HENT:  Nose: Nose normal.  Mouth/Throat: Oropharynx is clear and moist.  Neck: Neck supple. No thyromegaly present.  Cardiovascular: Normal rate and regular rhythm.  Pulmonary/Chest: Breath sounds normal. No respiratory distress. She has no wheezes.  Abdominal: Soft. Bowel sounds are normal. There is no tenderness.  Musculoskeletal: She exhibits no edema or tenderness.  Lymphadenopathy:    She has no cervical adenopathy.  Skin: No rash noted. No erythema.  Psychiatric: She has a  normal mood and affect. Her behavior is normal.    BP 138/90 (BP Location: Left Arm, Patient Position: Sitting, Cuff Size: Normal)   Pulse 63   Temp 98 F (36.7 C) (Oral)   Resp 20   Wt 137 lb 9.6 oz (62.4 kg)   SpO2 98%   BMI 23.21 kg/m  Wt Readings from Last 3 Encounters:  05/16/17 137 lb 9.6 oz (62.4 kg)  02/23/17 134 lb 9.6 oz (61.1 kg)  01/05/17 135 lb 3.2 oz (61.3 kg)     Lab Results  Component Value Date   WBC 4.2 12/16/2016   HGB 13.4 12/16/2016   HCT 39.2 12/16/2016   PLT 262.0 12/16/2016   GLUCOSE 105 (H) 03/09/2017   CHOL 139 12/16/2016   TRIG 112.0 12/16/2016   HDL 45.00 12/16/2016   LDLCALC 72 12/16/2016   ALT 12 12/16/2016   AST 16 12/16/2016   NA 134 (L) 03/09/2017   K 3.3 (L) 03/09/2017   CL 98 03/09/2017   CREATININE 0.98 03/09/2017   BUN 18 03/09/2017   CO2 30 03/09/2017   TSH 4.42 12/10/2014   HGBA1C 5.3 12/14/2015   MICROALBUR 0.8 11/05/2013       Assessment & Plan:   Problem List Items Addressed This Visit    Benign essential HTN    On amlodipine and tolerating.  Follow pressures.  Follow metabolic panel.        Palpitations    Improved on metoprolol.  Follow.           Einar Pheasant, MD

## 2017-05-20 ENCOUNTER — Encounter: Payer: Self-pay | Admitting: Internal Medicine

## 2017-05-20 NOTE — Assessment & Plan Note (Signed)
On amlodipine and tolerating.  Follow pressures.  Follow metabolic panel.

## 2017-05-20 NOTE — Assessment & Plan Note (Signed)
Improved on metoprolol.  Follow.

## 2017-07-16 ENCOUNTER — Other Ambulatory Visit: Payer: Self-pay | Admitting: Internal Medicine

## 2017-08-15 ENCOUNTER — Encounter: Payer: Self-pay | Admitting: Internal Medicine

## 2017-08-15 ENCOUNTER — Ambulatory Visit: Payer: BC Managed Care – PPO | Admitting: Internal Medicine

## 2017-08-15 VITALS — BP 132/84 | HR 62 | Temp 97.9°F | Resp 16 | Wt 140.0 lb

## 2017-08-15 DIAGNOSIS — F439 Reaction to severe stress, unspecified: Secondary | ICD-10-CM

## 2017-08-15 DIAGNOSIS — E876 Hypokalemia: Secondary | ICD-10-CM

## 2017-08-15 DIAGNOSIS — R002 Palpitations: Secondary | ICD-10-CM

## 2017-08-15 DIAGNOSIS — I10 Essential (primary) hypertension: Secondary | ICD-10-CM

## 2017-08-15 NOTE — Progress Notes (Signed)
Patient ID: Erika Daugherty, female   DOB: [birth year], 52 y.o.   MRN: [id number]   Subjective:    Patient ID: Erika Daugherty, female    DOB: January 30, 1966, 52 y.o.   MRN: [id number]  HPI  Patient here for a scheduled follow up. She reports she has been under increased stress recently.  Discussed with her today.  Discussed further intervention. She does not feel needs anything more at this time.  Desires no further intervention.  Tries to stay active.  No chest pain.  No sob.  No acid reflux.  No abdominal pain.  Bowels moving.  Has not been checking her blood pressure.  Has not felt like it has been elevated.  No significant palpitations.  Better since starting metoprolol.     Past Medical History:  Diagnosis Date  . Allergy   . GERD (gastroesophageal reflux disease)   . Heart murmur    Past Surgical History:  Procedure Laterality Date  . VAGINAL DELIVERY     2  . WISDOM TOOTH EXTRACTION     Family History  Problem Relation Age of Onset  . Hypertension Mother   . Hyperlipidemia Mother        triglycerides  . Cancer Father        bile duct  . Heart disease Father 9   Social History   Socioeconomic History  . Marital status: Married    Spouse name: Not on file  . Number of children: Not on file  . Years of education: Not on file  . Highest education level: Not on file  Occupational History  . Not on file  Social Needs  . Financial resource strain: Not on file  . Food insecurity:    Worry: Not on file    Inability: Not on file  . Transportation needs:    Medical: Not on file    Non-medical: Not on file  Tobacco Use  . Smoking status: Never Smoker  . Smokeless tobacco: Never Used  Substance and Sexual Activity  . Alcohol use: No    Comment: rare  . Drug use: No  . Sexual activity: Not on file  Lifestyle  . Physical activity:    Days per week: Not on file    Minutes per session: Not on file  . Stress: Not on file  Relationships  . Social connections:   Talks on phone: Not on file    Gets together: Not on file    Attends religious service: Not on file    Active member of club or organization: Not on file    Attends meetings of clubs or organizations: Not on file    Relationship status: Not on file  Other Topics Concern  . Not on file  Social History Narrative   Lives in [place], [place] with husband and two children (16YO, 11YO) and dog.      Work - Arboriculturist   Diet - relative healthy   Exercise - walk or elliptical 76min 5 days per week    Outpatient Encounter Medications as of 08/15/2017  Medication Sig  . amLODipine (NORVASC) 5 MG tablet Take 1 tablet (5 mg total) daily by mouth.  . cetirizine (ZYRTEC) 10 MG chewable tablet Chew 10 mg by mouth daily.  Marland Kitchen levonorgestrel-ethinyl estradiol (NORDETTE) 0.15-30 MG-MCG tablet Take 1 tablet by mouth daily.  . metoprolol succinate (TOPROL-XL) 25 MG 24 hr tablet TAKE 1 TABLET BY MOUTH EVERY DAY  . [DISCONTINUED] Famotidine (PEPCID PO) Take by mouth.  . [  DISCONTINUED] nitrofurantoin (MACRODANTIN) 50 MG capsule    No facility-administered encounter medications on file as of 08/15/2017.     Review of Systems  Constitutional: Negative for appetite change and unexpected weight change.  HENT: Negative for congestion and sinus pressure.   Respiratory: Negative for cough, chest tightness and shortness of breath.   Cardiovascular: Negative for chest pain, palpitations and leg swelling.  Gastrointestinal: Negative for abdominal pain, diarrhea, nausea and vomiting.  Genitourinary: Negative for difficulty urinating and dysuria.  Musculoskeletal: Negative for joint swelling and myalgias.  Skin: Negative for color change and rash.  Neurological: Negative for dizziness, light-headedness and headaches.  Hematological: Negative for adenopathy.  Psychiatric/Behavioral: Negative for agitation and dysphoric mood.       Increased stress as outlined.         Objective:     Blood pressure rechecked by  me prior to leaving:  132/84  Physical Exam  Constitutional: She appears well-developed and well-nourished. No distress.  HENT:  Nose: Nose normal.  Mouth/Throat: Oropharynx is clear and moist.  Neck: Neck supple. No thyromegaly present.  Cardiovascular: Normal rate and regular rhythm.  Pulmonary/Chest: Breath sounds normal. No respiratory distress. She has no wheezes.  Abdominal: Soft. Bowel sounds are normal. There is no tenderness.  Musculoskeletal: She exhibits no edema or tenderness.  Lymphadenopathy:    She has no cervical adenopathy.  Skin: No rash noted. No erythema.  Psychiatric: She has a normal mood and affect. Her behavior is normal.    BP 132/84   Pulse 62   Temp 97.9 F (36.6 C) (Oral)   Resp 16   Wt 140 lb (63.5 kg)   SpO2 99%   BMI 23.61 kg/m  Wt Readings from Last 3 Encounters:  08/15/17 140 lb (63.5 kg)  05/16/17 137 lb 9.6 oz (62.4 kg)  02/23/17 134 lb 9.6 oz (61.1 kg)     Lab Results  Component Value Date   WBC 4.2 12/16/2016   HGB 13.4 12/16/2016   HCT 39.2 12/16/2016   PLT 262.0 12/16/2016   GLUCOSE 77 08/15/2017   CHOL 139 12/16/2016   TRIG 112.0 12/16/2016   HDL 45.00 12/16/2016   LDLCALC 72 12/16/2016   ALT 12 12/16/2016   AST 16 12/16/2016   NA 138 08/15/2017   K 3.4 (L) 08/15/2017   CL 102 08/15/2017   CREATININE 0.83 08/15/2017   BUN 12 08/15/2017   CO2 29 08/15/2017   TSH 4.42 12/10/2014   HGBA1C 5.3 12/14/2015   MICROALBUR 0.8 11/05/2013       Assessment & Plan:   Problem List Items Addressed This Visit    Benign essential HTN    On amlodipine and metoprolol.  Blood pressure on recheck as outlined.  Hold on adjustments in her medication.  Follow pressures.  Follow metabolic panel.  Have her spot check her pressure.       Palpitations    Better since starting on metoprolol.        Stress    Increased stress as outlined.  Discussed with her today.  She desires no further intervention at this time.  Follow.          Other Visit Diagnoses    Hypokalemia    -  Primary   recheck potassium.    Relevant Orders   Basic metabolic panel (Completed)       Einar Pheasant, MD

## 2017-08-16 LAB — BASIC METABOLIC PANEL
BUN: 12 mg/dL (ref 6–23)
CALCIUM: 9.3 mg/dL (ref 8.4–10.5)
CO2: 29 mEq/L (ref 19–32)
CREATININE: 0.83 mg/dL (ref 0.40–1.20)
Chloride: 102 mEq/L (ref 96–112)
GFR: 77.31 mL/min (ref >60.00)
GLUCOSE: 77 mg/dL (ref 70–99)
Potassium: 3.4 mEq/L — ABNORMAL LOW (ref 3.5–5.1)
Sodium: 138 mEq/L (ref 135–145)

## 2017-08-17 ENCOUNTER — Encounter: Payer: Self-pay | Admitting: Internal Medicine

## 2017-08-18 ENCOUNTER — Encounter: Payer: Self-pay | Admitting: Internal Medicine

## 2017-08-18 DIAGNOSIS — F439 Reaction to severe stress, unspecified: Secondary | ICD-10-CM | POA: Insufficient documentation

## 2017-08-18 NOTE — Assessment & Plan Note (Signed)
Increased stress as outlined.  Discussed with her today.  She desires no further intervention at this time.  Follow.

## 2017-08-18 NOTE — Assessment & Plan Note (Signed)
Better since starting on metoprolol.

## 2017-08-18 NOTE — Assessment & Plan Note (Signed)
On amlodipine and metoprolol.  Blood pressure on recheck as outlined.  Hold on adjustments in her medication.  Follow pressures.  Follow metabolic panel.  Have her spot check her pressure.

## 2017-09-04 ENCOUNTER — Encounter: Payer: Self-pay | Admitting: Internal Medicine

## 2017-09-06 ENCOUNTER — Other Ambulatory Visit: Payer: Self-pay | Admitting: Internal Medicine

## 2017-09-17 ENCOUNTER — Other Ambulatory Visit: Payer: Self-pay | Admitting: Internal Medicine

## 2017-11-16 ENCOUNTER — Other Ambulatory Visit: Payer: Self-pay | Admitting: Internal Medicine

## 2017-12-12 LAB — HM MAMMOGRAPHY

## 2017-12-22 ENCOUNTER — Encounter: Payer: BC Managed Care – PPO | Admitting: Internal Medicine

## 2017-12-27 ENCOUNTER — Encounter: Payer: Self-pay | Admitting: Internal Medicine

## 2017-12-27 ENCOUNTER — Ambulatory Visit (INDEPENDENT_AMBULATORY_CARE_PROVIDER_SITE_OTHER): Payer: BC Managed Care – PPO | Admitting: Internal Medicine

## 2017-12-27 VITALS — BP 126/72 | HR 70 | Temp 98.3°F | Resp 18 | Ht 65.0 in | Wt 134.6 lb

## 2017-12-27 DIAGNOSIS — M791 Myalgia, unspecified site: Secondary | ICD-10-CM | POA: Diagnosis not present

## 2017-12-27 DIAGNOSIS — I10 Essential (primary) hypertension: Secondary | ICD-10-CM | POA: Diagnosis not present

## 2017-12-27 DIAGNOSIS — R5383 Other fatigue: Secondary | ICD-10-CM | POA: Diagnosis not present

## 2017-12-27 DIAGNOSIS — R519 Headache, unspecified: Secondary | ICD-10-CM

## 2017-12-27 DIAGNOSIS — M7989 Other specified soft tissue disorders: Secondary | ICD-10-CM

## 2017-12-27 DIAGNOSIS — F439 Reaction to severe stress, unspecified: Secondary | ICD-10-CM

## 2017-12-27 DIAGNOSIS — M799 Soft tissue disorder, unspecified: Secondary | ICD-10-CM

## 2017-12-27 DIAGNOSIS — Z0000 Encounter for general adult medical examination without abnormal findings: Secondary | ICD-10-CM | POA: Diagnosis not present

## 2017-12-27 DIAGNOSIS — R51 Headache: Secondary | ICD-10-CM

## 2017-12-27 DIAGNOSIS — R002 Palpitations: Secondary | ICD-10-CM

## 2017-12-27 LAB — BASIC METABOLIC PANEL
BUN: 14 mg/dL (ref 6–23)
CHLORIDE: 103 meq/L (ref 96–112)
CO2: 29 mEq/L (ref 19–32)
Calcium: 9.1 mg/dL (ref 8.4–10.5)
Creatinine, Ser: 0.9 mg/dL (ref 0.40–1.20)
GFR: 70.31 mL/min (ref >60.00)
Glucose, Bld: 91 mg/dL (ref 70–99)
POTASSIUM: 4.2 meq/L (ref 3.5–5.1)
SODIUM: 138 meq/L (ref 135–145)

## 2017-12-27 LAB — CBC WITH DIFFERENTIAL/PLATELET
BASOS ABS: 0 10*3/uL (ref 0.0–0.1)
Basophils Relative: 0.6 % (ref 0.0–3.0)
EOS PCT: 0.7 % (ref 0.0–5.0)
Eosinophils Absolute: 0 10*3/uL (ref 0.0–0.7)
HEMATOCRIT: 39.4 % (ref 36.0–46.0)
Hemoglobin: 13.5 g/dL (ref 12.0–15.0)
LYMPHS ABS: 1.4 10*3/uL (ref 0.7–4.0)
LYMPHS PCT: 29.2 % (ref 12.0–46.0)
MCHC: 34.3 g/dL (ref 30.0–36.0)
MCV: 93.3 fl (ref 78.0–100.0)
MONOS PCT: 10.7 % (ref 3.0–12.0)
Monocytes Absolute: 0.5 10*3/uL (ref 0.1–1.0)
Neutro Abs: 2.7 10*3/uL (ref 1.4–7.7)
Neutrophils Relative %: 58.8 % (ref 43.0–77.0)
Platelets: 266 10*3/uL (ref 150.0–400.0)
RBC: 4.22 Mil/uL (ref 3.87–5.11)
RDW: 12.5 % (ref 11.5–15.5)
WBC: 4.6 10*3/uL (ref 4.0–10.5)

## 2017-12-27 LAB — HEPATIC FUNCTION PANEL
ALBUMIN: 4.1 g/dL (ref 3.5–5.2)
ALT: 14 U/L (ref 0–35)
AST: 16 U/L (ref 0–37)
Alkaline Phosphatase: 60 U/L (ref 39–117)
Bilirubin, Direct: 0.2 mg/dL (ref 0.0–0.3)
TOTAL PROTEIN: 8.4 g/dL — AB (ref 6.0–8.3)
Total Bilirubin: 0.8 mg/dL (ref 0.2–1.2)

## 2017-12-27 LAB — VITAMIN D 25 HYDROXY (VIT D DEFICIENCY, FRACTURES): VITD: 37.19 ng/mL (ref 30.00–100.00)

## 2017-12-27 LAB — LIPID PANEL
CHOL/HDL RATIO: 3
Cholesterol: 147 mg/dL (ref 0–200)
HDL: 44.5 mg/dL (ref >39.00)
LDL CALC: 77 mg/dL (ref 0–99)
NONHDL: 102.04
TRIGLYCERIDES: 123 mg/dL (ref 0.0–149.0)
VLDL: 24.6 mg/dL (ref 0.0–40.0)

## 2017-12-27 LAB — CK: CK TOTAL: 34 U/L (ref 7–177)

## 2017-12-27 LAB — TSH: TSH: 3.65 u[IU]/mL (ref 0.35–4.50)

## 2017-12-27 LAB — SEDIMENTATION RATE: Sed Rate: 24 mm/hr (ref 0–30)

## 2017-12-27 NOTE — Assessment & Plan Note (Signed)
Physical today 12/27/17.  Pelvic/pap and breast exam through gyn.  Discussed colonoscopy.

## 2017-12-27 NOTE — Progress Notes (Signed)
Patient ID: Erika Daugherty, female   DOB: 08-07-1965, 52 y.o.   MRN: [id number]   Subjective:    Patient ID: Erika Daugherty, female    DOB: 11/06/1965, 52 y.o.   MRN: [id number]  HPI  Patient here for a scheduled physical exam.  She gets her breast and pelvic exams through gyn.  She reports noticing some intermittent left side head pain.  Just occurs occasionally.  Sharp pain that only last 1-2 seconds.  Only occurs intermittently.  She does report some increased congestion.  Back on nasacort.  No significant sinus pressure.  Some fatigue.  No chest pain.  No sob.  No acid reflux.  No abdominal pain.  Bowels moving.  Increased stress.  Discussed with her today.  Overall she feels she is handling things relatively well.  Blood pressures have been doing better.   Blood pressures averaging 132/82 and 148/82.     Past Medical History:  Diagnosis Date  . Allergy   . GERD (gastroesophageal reflux disease)   . Heart murmur    Past Surgical History:  Procedure Laterality Date  . VAGINAL DELIVERY     2  . WISDOM TOOTH EXTRACTION     Family History  Problem Relation Age of Onset  . Hypertension Mother   . Hyperlipidemia Mother        triglycerides  . Cancer Father        bile duct  . Heart disease Father 13   Social History   Socioeconomic History  . Marital status: Married    Spouse name: Not on file  . Number of children: Not on file  . Years of education: Not on file  . Highest education level: Not on file  Occupational History  . Not on file  Social Needs  . Financial resource strain: Not on file  . Food insecurity:    Worry: Not on file    Inability: Not on file  . Transportation needs:    Medical: Not on file    Non-medical: Not on file  Tobacco Use  . Smoking status: Never Smoker  . Smokeless tobacco: Never Used  Substance and Sexual Activity  . Alcohol use: No    Comment: rare  . Drug use: No  . Sexual activity: Not on file  Lifestyle  . Physical activity:      Days per week: Not on file    Minutes per session: Not on file  . Stress: Not on file  Relationships  . Social connections:    Talks on phone: Not on file    Gets together: Not on file    Attends religious service: Not on file    Active member of club or organization: Not on file    Attends meetings of clubs or organizations: Not on file    Relationship status: Not on file  Other Topics Concern  . Not on file  Social History Narrative   Lives in [place], [place] with husband and two children (16YO, 11YO) and dog.      Work - Arboriculturist   Diet - relative healthy   Exercise - walk or elliptical 56mn 5 days per week    Outpatient Encounter Medications as of 12/27/2017  Medication Sig  . amLODipine (NORVASC) 5 MG tablet TAKE 1 TABLET (5 MG TOTAL) DAILY BY MOUTH.  .Marland Kitchenlevonorgestrel-ethinyl estradiol (NORDETTE) 0.15-30 MG-MCG tablet Take 1 tablet by mouth daily.  . metoprolol succinate (TOPROL-XL) 25 MG 24 hr tablet TAKE 1 TABLET  BY MOUTH EVERY DAY  . [DISCONTINUED] cetirizine (ZYRTEC) 10 MG chewable tablet Chew 10 mg by mouth daily.   No facility-administered encounter medications on file as of 12/27/2017.     Review of Systems  Constitutional: Negative for appetite change and unexpected weight change.  HENT: Positive for congestion. Negative for sinus pressure.   Eyes: Negative for pain and visual disturbance.  Respiratory: Negative for cough, chest tightness and shortness of breath.   Cardiovascular: Negative for chest pain, palpitations and leg swelling.  Gastrointestinal: Negative for abdominal pain, diarrhea, nausea and vomiting.  Genitourinary: Negative for difficulty urinating and dysuria.  Musculoskeletal: Negative for joint swelling and myalgias.  Skin: Negative for color change and rash.  Neurological: Negative for dizziness and light-headedness.       Intermittent sharp head pain as outlined.  No severe or persistent headache.    Hematological: Negative for  adenopathy. Does not bruise/bleed easily.  Psychiatric/Behavioral: Negative for agitation and dysphoric mood.       Objective:    Physical Exam  Constitutional: She appears well-developed and well-nourished. No distress.  HENT:  Nose: Nose normal.  Mouth/Throat: Oropharynx is clear and moist.  Eyes: Conjunctivae are normal. Right eye exhibits no discharge. Left eye exhibits no discharge.  Neck: Neck supple. No thyromegaly present.  Palpable soft tissue mass - left angle of jaw.  Non tender.    Cardiovascular: Normal rate and regular rhythm.  Pulmonary/Chest: Breath sounds normal. No respiratory distress. She has no wheezes.  Breasts:  Through gyn.    Abdominal: Soft. Bowel sounds are normal. There is no tenderness.  Genitourinary:  Genitourinary Comments: Through gyn.   Musculoskeletal: She exhibits no edema or tenderness.  Skin: No rash noted. No erythema.  Psychiatric: She has a normal mood and affect. Her behavior is normal.    BP 126/72 (BP Location: Left Arm, Patient Position: Sitting, Cuff Size: Normal)   Pulse 70   Temp 98.3 F (36.8 C) (Oral)   Resp 18   Ht _0  (1.651 m)   Wt 134 lb 9.6 oz (61.1 kg)   SpO2 98%   BMI 22.40 kg/m  Wt Readings from Last 3 Encounters:  12/27/17 134 lb 9.6 oz (61.1 kg)  08/15/17 140 lb (63.5 kg)  05/16/17 137 lb 9.6 oz (62.4 kg)     Lab Results  Component Value Date   WBC 4.6 12/27/2017   HGB 13.5 12/27/2017   HCT 39.4 12/27/2017   PLT 266.0 12/27/2017   GLUCOSE 91 12/27/2017   CHOL 147 12/27/2017   TRIG 123.0 12/27/2017   HDL 44.50 12/27/2017   LDLCALC 77 12/27/2017   ALT 14 12/27/2017   AST 16 12/27/2017   NA 138 12/27/2017   K 4.2 12/27/2017   CL 103 12/27/2017   CREATININE 0.90 12/27/2017   BUN 14 12/27/2017   CO2 29 12/27/2017   TSH 3.65 12/27/2017   HGBA1C 5.3 12/14/2015   MICROALBUR 0.8 11/05/2013       Assessment & Plan:   Problem List Items Addressed This Visit    Annual physical exam    Physical  today 12/27/17.  Pelvic/pap and breast exam through gyn.  Discussed colonoscopy.        Benign essential HTN - Primary    Blood pressure as outlined.  Continue current medication regimen.  Follow pressures.  Follow metabolic panel.        Relevant Orders   Lipid panel (Completed)   Hepatic function panel (Completed)   Basic metabolic  panel (Completed)   Fatigue    Check routine labs including cbc, metabolic panel and thyroid test.        Relevant Orders   CBC with Differential/Platelet (Completed)   TSH (Completed)   VITAMIN D 25 Hydroxy (Vit-D Deficiency, Fractures) (Completed)   Head pain    Occasional sharp head pain as outlined.  Unclear etiology.  Has the soft tissue mass as outlined.  Have ENT evaluate.        Relevant Orders   Ambulatory referral to ENT   Muscle ache    Did report some muscle aching.  Check esr and ck.  Stretches.  Follow.        Relevant Orders   Sedimentation rate (Completed)   CK (Creatine Kinase) (Completed)   Palpitations    Better since being on metoprolol.  Follow.        Soft tissue mass    Soft tissue mass - left angle of jaw.  Persistent.  Will have ENT evaluate.        Relevant Orders   Ambulatory referral to ENT   Stress    Increased stress.  Discussed with her today.  She does not feel needs any further intervention.  Follow.            Einar Pheasant, MD

## 2018-01-01 ENCOUNTER — Encounter: Payer: Self-pay | Admitting: Internal Medicine

## 2018-01-01 DIAGNOSIS — R51 Headache: Secondary | ICD-10-CM

## 2018-01-01 DIAGNOSIS — R519 Headache, unspecified: Secondary | ICD-10-CM | POA: Insufficient documentation

## 2018-01-01 DIAGNOSIS — M7989 Other specified soft tissue disorders: Secondary | ICD-10-CM | POA: Insufficient documentation

## 2018-01-01 NOTE — Assessment & Plan Note (Signed)
Soft tissue mass - left angle of jaw.  Persistent.  Will have ENT evaluate.

## 2018-01-01 NOTE — Addendum Note (Signed)
Addended by: Alisa Graff on: 01/01/2018 07:38 PM   Modules accepted: Level of Service

## 2018-01-01 NOTE — Assessment & Plan Note (Signed)
Did report some muscle aching.  Check esr and ck.  Stretches.  Follow.

## 2018-01-01 NOTE — Assessment & Plan Note (Signed)
Occasional sharp head pain as outlined.  Unclear etiology.  Has the soft tissue mass as outlined.  Have ENT evaluate.

## 2018-01-01 NOTE — Assessment & Plan Note (Signed)
Blood pressure as outlined.  Continue current medication regimen.  Follow pressures.  Follow metabolic panel.  

## 2018-01-01 NOTE — Assessment & Plan Note (Signed)
Better since being on metoprolol.  Follow.

## 2018-01-01 NOTE — Assessment & Plan Note (Signed)
Increased stress.  Discussed with her today. She does not feel needs any further intervention.  Follow.   

## 2018-01-01 NOTE — Assessment & Plan Note (Signed)
Check routine labs including cbc, metabolic panel and thyroid test.

## 2018-01-12 ENCOUNTER — Other Ambulatory Visit: Payer: Self-pay | Admitting: Internal Medicine

## 2018-02-07 ENCOUNTER — Other Ambulatory Visit: Payer: Self-pay | Admitting: Internal Medicine

## 2018-03-03 ENCOUNTER — Other Ambulatory Visit: Payer: Self-pay | Admitting: Internal Medicine

## 2018-03-06 ENCOUNTER — Other Ambulatory Visit: Payer: Self-pay | Admitting: Internal Medicine

## 2018-03-29 ENCOUNTER — Other Ambulatory Visit: Payer: Self-pay | Admitting: Internal Medicine

## 2018-04-13 ENCOUNTER — Ambulatory Visit: Payer: BC Managed Care – PPO | Admitting: Internal Medicine

## 2018-04-20 ENCOUNTER — Encounter: Payer: Self-pay | Admitting: Internal Medicine

## 2018-04-20 ENCOUNTER — Ambulatory Visit: Payer: BC Managed Care – PPO | Admitting: Internal Medicine

## 2018-04-20 DIAGNOSIS — F439 Reaction to severe stress, unspecified: Secondary | ICD-10-CM | POA: Diagnosis not present

## 2018-04-20 DIAGNOSIS — I10 Essential (primary) hypertension: Secondary | ICD-10-CM | POA: Diagnosis not present

## 2018-04-20 MED ORDER — METOPROLOL SUCCINATE ER 25 MG PO TB24: 25.0000 mg | ORAL_TABLET | Freq: Every day | ORAL | 3 refills | Status: DC

## 2018-04-20 NOTE — Progress Notes (Signed)
Patient ID: Erika Daugherty, female   DOB: Apr 18, 1966, 52 y.o.   MRN: [id number]   Subjective:    Patient ID: Erika Daugherty, female    DOB: 12/08/1965, 52 y.o.   MRN: [id number]  HPI  Patient here for a scheduled follow up.  She reports she is doing relatively well.  Staying active.  No chest pain.  No sob.  No acid reflux.  No abdominal pain.  Bowels moving.  No urine change.  LMP 9/19.  Stopped her ocp's.  No period since stopping.  Outside blood pressure checks - 132-135/78.     Past Medical History:  Diagnosis Date  . Allergy   . GERD (gastroesophageal reflux disease)   . Heart murmur    Past Surgical History:  Procedure Laterality Date  . VAGINAL DELIVERY     2  . WISDOM TOOTH EXTRACTION     Family History  Problem Relation Age of Onset  . Hypertension Mother   . Hyperlipidemia Mother        triglycerides  . Cancer Father        bile duct  . Heart disease Father 62   Social History   Socioeconomic History  . Marital status: Married    Spouse name: Not on file  . Number of children: Not on file  . Years of education: Not on file  . Highest education level: Not on file  Occupational History  . Not on file  Social Needs  . Financial resource strain: Not on file  . Food insecurity:    Worry: Not on file    Inability: Not on file  . Transportation needs:    Medical: Not on file    Non-medical: Not on file  Tobacco Use  . Smoking status: Never Smoker  . Smokeless tobacco: Never Used  Substance and Sexual Activity  . Alcohol use: No    Comment: rare  . Drug use: No  . Sexual activity: Not on file  Lifestyle  . Physical activity:    Days per week: Not on file    Minutes per session: Not on file  . Stress: Not on file  Relationships  . Social connections:    Talks on phone: Not on file    Gets together: Not on file    Attends religious service: Not on file    Active member of club or organization: Not on file    Attends meetings of clubs or  organizations: Not on file    Relationship status: Not on file  Other Topics Concern  . Not on file  Social History Narrative   Lives in [place], [place] with husband and two children (16YO, 11YO) and dog.      Work - Arboriculturist   Diet - relative healthy   Exercise - walk or elliptical 38min 5 days per week    Outpatient Encounter Medications as of 04/20/2018  Medication Sig  . amLODipine (NORVASC) 5 MG tablet TAKE 1 TABLET (5 MG TOTAL) DAILY BY MOUTH.  . metoprolol succinate (TOPROL-XL) 25 MG 24 hr tablet Take 1 tablet (25 mg total) by mouth daily.  . [DISCONTINUED] metoprolol succinate (TOPROL-XL) 25 MG 24 hr tablet TAKE 1 TABLET BY MOUTH EVERY DAY  . [DISCONTINUED] levonorgestrel-ethinyl estradiol (NORDETTE) 0.15-30 MG-MCG tablet Take 1 tablet by mouth daily.   No facility-administered encounter medications on file as of 04/20/2018.     Review of Systems  Constitutional: Negative for appetite change and unexpected weight change.  HENT:  Negative for congestion and sinus pressure.   Respiratory: Negative for cough, chest tightness and shortness of breath.   Cardiovascular: Negative for chest pain, palpitations and leg swelling.  Gastrointestinal: Negative for abdominal pain, diarrhea, nausea and vomiting.  Genitourinary: Negative for difficulty urinating and dysuria.  Musculoskeletal: Negative for joint swelling and myalgias.  Skin: Negative for color change and rash.  Neurological: Negative for dizziness, light-headedness and headaches.  Psychiatric/Behavioral: Negative for agitation and dysphoric mood.       Objective:    Physical Exam Constitutional:      General: She is not in acute distress.    Appearance: Normal appearance.  HENT:     Nose: Nose normal. No congestion.     Mouth/Throat:     Pharynx: No oropharyngeal exudate or posterior oropharyngeal erythema.  Neck:     Musculoskeletal: Neck supple. No muscular tenderness.     Thyroid: No thyromegaly.    Cardiovascular:     Rate and Rhythm: Normal rate and regular rhythm.  Pulmonary:     Effort: No respiratory distress.     Breath sounds: Normal breath sounds. No wheezing.  Abdominal:     General: Bowel sounds are normal.     Palpations: Abdomen is soft.     Tenderness: There is no abdominal tenderness.  Musculoskeletal:        General: No swelling or tenderness.  Lymphadenopathy:     Cervical: No cervical adenopathy.  Skin:    Findings: No erythema or rash.  Neurological:     Mental Status: She is alert.  Psychiatric:        Mood and Affect: Mood normal.        Thought Content: Thought content normal.     BP 138/80 (BP Location: Left Arm, Patient Position: Sitting, Cuff Size: Normal)   Pulse (!) 57   Temp 98.1 F (36.7 C) (Oral)   Resp 16   Wt 136 lb (61.7 kg)   SpO2 98%   BMI 22.63 kg/m  Wt Readings from Last 3 Encounters:  04/26/18 136 lb (61.7 kg)  04/20/18 136 lb (61.7 kg)  12/27/17 134 lb 9.6 oz (61.1 kg)     Lab Results  Component Value Date   WBC 4.6 12/27/2017   HGB 13.5 12/27/2017   HCT 39.4 12/27/2017   PLT 266.0 12/27/2017   GLUCOSE 91 12/27/2017   CHOL 147 12/27/2017   TRIG 123.0 12/27/2017   HDL 44.50 12/27/2017   LDLCALC 77 12/27/2017   ALT 14 12/27/2017   AST 16 12/27/2017   NA 138 12/27/2017   K 4.2 12/27/2017   CL 103 12/27/2017   CREATININE 0.90 12/27/2017   BUN 14 12/27/2017   CO2 29 12/27/2017   TSH 3.65 12/27/2017   HGBA1C 5.3 12/14/2015   MICROALBUR 0.8 11/05/2013       Assessment & Plan:   Problem List Items Addressed This Visit    Benign essential HTN    Blood pressure under good control.  Continue same medication regimen.  Follow pressures.  Follow metabolic panel.        Relevant Medications   metoprolol succinate (TOPROL-XL) 25 MG 24 hr tablet   Stress    Discussed with her today.  Overall doing well.  Follow.            Einar Pheasant, MD

## 2018-04-26 ENCOUNTER — Encounter: Payer: Self-pay | Admitting: Emergency Medicine

## 2018-04-26 ENCOUNTER — Encounter: Payer: Self-pay | Admitting: Internal Medicine

## 2018-04-26 ENCOUNTER — Ambulatory Visit
Admission: EM | Admit: 2018-04-26 | Discharge: 2018-04-26 | Disposition: A | Discharge status: Home / Self Care | Payer: BC Managed Care – PPO | Attending: Family Medicine | Admitting: Family Medicine

## 2018-04-26 ENCOUNTER — Other Ambulatory Visit: Payer: Self-pay

## 2018-04-26 DIAGNOSIS — N390 Urinary tract infection, site not specified: Secondary | ICD-10-CM | POA: Insufficient documentation

## 2018-04-26 DIAGNOSIS — R300 Dysuria: Secondary | ICD-10-CM

## 2018-04-26 DIAGNOSIS — R3915 Urgency of urination: Secondary | ICD-10-CM | POA: Diagnosis not present

## 2018-04-26 LAB — URINALYSIS, COMPLETE (UACMP) WITH MICROSCOPIC
Bacteria, UA: NONE SEEN
Bilirubin Urine: NEGATIVE
GLUCOSE, UA: NEGATIVE mg/dL
Ketones, ur: NEGATIVE mg/dL
NITRITE: NEGATIVE
Protein, ur: NEGATIVE mg/dL
Specific Gravity, Urine: 1.015 (ref 1.005–1.030)
WBC, UA: >50 WBC/hpf (ref 0–5)
pH: 5.5 (ref 5.0–8.0)

## 2018-04-26 MED ORDER — PHENAZOPYRIDINE HCL 200 MG PO TABS: 200.0000 mg | ORAL_TABLET | Freq: Three times a day (TID) | ORAL | 0 refills | Status: DC

## 2018-04-26 MED ORDER — CEPHALEXIN 500 MG PO CAPS: 500.0000 mg | ORAL_CAPSULE | Freq: Two times a day (BID) | ORAL | 0 refills | Status: DC

## 2018-04-26 NOTE — ED Triage Notes (Signed)
Patient c/o urinary urgency and pressure that started this morning. Patient took 1 Marcodantin pill that she had from a previous UTI.

## 2018-04-26 NOTE — ED Provider Notes (Signed)
[facility]    CSN: [id number] Arrival date & time: 04/26/18  1158     History   Chief Complaint Chief Complaint  Patient presents with  . Urinary Urgency  . Dysuria    HPI Erika Daugherty is a 52 y.o. female.   HPI  -year-old female presents with urinary urgency and suprapubic pressure started this morning.  States that he took 1 Macrodantin pill that she had from a previous UTI.  She first started noticing urgency frequency and dysuria described as burning.  She is had satiety as well.  Denies any fever or chills.  She has had no nausea vomiting or back pain.  She denies any vaginal discharge.        Past Medical History:  Diagnosis Date  . Allergy   . GERD (gastroesophageal reflux disease)   . Heart murmur     Patient Active Problem List   Diagnosis Date Noted  . Soft tissue mass 01/01/2018  . Head pain 01/01/2018  . Muscle ache 12/27/2017  . Stress 08/18/2017  . Angioedema 12/28/2016  . Annual physical exam 12/16/2016  . Benign essential HTN 06/17/2015  . 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 Palpitations 03/05/2012  . Fatigue 01/30/2012    Past Surgical History:  Procedure Laterality Date  . VAGINAL DELIVERY     2  . WISDOM TOOTH EXTRACTION      OB History   No obstetric history on file.      Home Medications    Prior to Admission medications   Medication Sig Start Date End Date Taking? Authorizing Provider  amLODipine (NORVASC) 5 MG tablet TAKE 1 TABLET (5 MG TOTAL) DAILY BY MOUTH. 03/05/18  Yes Einar Pheasant, MD  metoprolol succinate (TOPROL-XL) 25 MG 24 hr tablet Take 1 tablet (25 mg total) by mouth daily. 04/20/18  Yes Einar Pheasant, MD  cephALEXin (KEFLEX) 500 MG capsule Take 1 capsule (500 mg total) by mouth 2 (two) times daily. 04/26/18   Lorin Picket, PA-C  phenazopyridine (PYRIDIUM) 200 MG tablet Take 1 tablet (200 mg total) by mouth 3 (three) times daily. 04/26/18   Lorin Picket, PA-C    Family History Family  History  Problem Relation Age of Onset  . Hypertension Mother   . Hyperlipidemia Mother        triglycerides  . Cancer Father        bile duct  . Heart disease Father 102    Social History Social History   Tobacco Use  . Smoking status: Never Smoker  . Smokeless tobacco: Never Used  Substance Use Topics  . Alcohol use: No    Comment: rare  . Drug use: No     Allergies   Erythromycin; Lisinopril; Prednisone; and Strawberry extract   Review of Systems Review of Systems  Constitutional: Positive for activity change. Negative for appetite change, chills, fatigue and fever.  Genitourinary: Positive for decreased urine volume, dysuria, frequency and urgency. Negative for vaginal discharge and vaginal pain.  All other systems reviewed and are negative.    Physical Exam Triage Vital Signs ED Triage Vitals  Enc Vitals Group     BP 04/26/18 1241 (!) 161/88     Pulse Rate 04/26/18 1241 62     Resp 04/26/18 1241 18     Temp 04/26/18 1241 98.5 F (36.9 C)     Temp Source 04/26/18 1241 Oral     SpO2 04/26/18 1241 100 %     Weight 04/26/18 1240 136  lb (61.7 kg)     Height 04/26/18 1240 5\' 4"  (1.626 m)     Head Circumference --      Peak Flow --      Pain Score 04/26/18 1239 2     Pain Loc --      Pain Edu? --      Excl. in [place]? --    No data found.  Updated Vital Signs BP (!) 161/88 (BP Location: Right Arm)   Pulse 62   Temp 98.5 F (36.9 C) (Oral)   Resp 18   Ht 5\' 4"  (1.626 m)   Wt 136 lb (61.7 kg)   LMP 01/18/2018   SpO2 100%   BMI 23.34 kg/m   Visual Acuity Right Eye Distance:   Left Eye Distance:   Bilateral Distance:    Right Eye Near:   Left Eye Near:    Bilateral Near:     Physical Exam Vitals signs and nursing note reviewed.  Constitutional:      General: She is not in acute distress.    Appearance: Normal appearance. She is normal weight. She is not ill-appearing, toxic-appearing or diaphoretic.  HENT:     Head: Normocephalic.     Nose:  Nose normal.     Mouth/Throat:     Mouth: Mucous membranes are moist.  Eyes:     General:        Right eye: No discharge.        Left eye: No discharge.     Conjunctiva/sclera: Conjunctivae normal.  Neck:     Musculoskeletal: Normal range of motion and neck supple.  Pulmonary:     Effort: Pulmonary effort is normal.     Breath sounds: Normal breath sounds.  Abdominal:     General: Abdomen is flat.     Tenderness: There is no right CVA tenderness or left CVA tenderness.  Musculoskeletal: Normal range of motion.  Skin:    General: Skin is warm and dry.  Neurological:     General: No focal deficit present.     Mental Status: She is alert and oriented to person, place, and time.  Psychiatric:        Mood and Affect: Mood normal.        Behavior: Behavior normal.        Thought Content: Thought content normal.        Judgment: Judgment normal.      UC Treatments / Results  Labs (all labs ordered are listed, but only abnormal results are displayed) Labs Reviewed  URINALYSIS, COMPLETE (UACMP) WITH MICROSCOPIC - Abnormal; Notable for the following components:      Result Value   APPearance HAZY (*)    Hgb urine dipstick MODERATE (*)    Leukocytes, UA SMALL (*)    All other components within normal limits    EKG None  Radiology No results found.  Procedures Procedures (including critical care time)  Medications Ordered in UC Medications - No data to display  Initial Impression / Assessment and Plan / UC Course  I have reviewed the triage vital signs and the nursing notes.  Pertinent labs & imaging results that were available during my care of the patient were reviewed by me and considered in my medical decision making (see chart for details).    She likely has a urinary tract infection.  She has had several in the past and she says that the symptoms are similar.  We will start her on Keflex twice  daily for 5 days.  I will also provide her with Pyridium because of  the dysuria that she is experiencing.  Cultures and sensitivities will be available in 2 days.   Final Clinical Impressions(s) / UC Diagnoses   Final diagnoses:  Lower urinary tract infectious disease   Discharge Instructions   None    ED Prescriptions    Medication Sig Dispense Auth. Provider   cephALEXin (KEFLEX) 500 MG capsule Take 1 capsule (500 mg total) by mouth 2 (two) times daily. 10 capsule Crecencio Mc P, PA-C   phenazopyridine (PYRIDIUM) 200 MG tablet Take 1 tablet (200 mg total) by mouth 3 (three) times daily. 6 tablet Lorin Picket, PA-C     Controlled Substance Prescriptions [place] Controlled Substance Registry consulted? Not Applicable   Lorin Picket, PA-C 04/26/18 1337

## 2018-04-28 ENCOUNTER — Encounter: Payer: Self-pay | Admitting: Internal Medicine

## 2018-04-28 LAB — URINE CULTURE: Culture: NO GROWTH

## 2018-04-28 NOTE — Assessment & Plan Note (Signed)
Discussed with her today.  Overall doing well.  Follow.

## 2018-04-28 NOTE — Assessment & Plan Note (Signed)
Blood pressure under good control.  Continue same medication regimen.  Follow pressures.  Follow metabolic panel.   

## 2018-07-29 ENCOUNTER — Telehealth: Payer: BC Managed Care – PPO | Admitting: Family

## 2018-07-29 DIAGNOSIS — N390 Urinary tract infection, site not specified: Secondary | ICD-10-CM | POA: Diagnosis not present

## 2018-07-29 DIAGNOSIS — A499 Bacterial infection, unspecified: Secondary | ICD-10-CM

## 2018-07-29 MED ORDER — NITROFURANTOIN MONOHYD MACRO 100 MG PO CAPS: 100.0000 mg | ORAL_CAPSULE | Freq: Two times a day (BID) | ORAL | 0 refills | Status: DC

## 2018-07-29 NOTE — Progress Notes (Signed)

## 2018-07-30 NOTE — Progress Notes (Signed)

## 2018-08-30 ENCOUNTER — Other Ambulatory Visit: Payer: Self-pay | Admitting: Internal Medicine

## 2018-10-30 ENCOUNTER — Ambulatory Visit: Payer: BC Managed Care – PPO | Admitting: Internal Medicine

## 2018-10-30 ENCOUNTER — Ambulatory Visit (INDEPENDENT_AMBULATORY_CARE_PROVIDER_SITE_OTHER): Payer: BC Managed Care – PPO | Admitting: Internal Medicine

## 2018-10-30 ENCOUNTER — Encounter: Payer: Self-pay | Admitting: Internal Medicine

## 2018-10-30 DIAGNOSIS — I10 Essential (primary) hypertension: Secondary | ICD-10-CM | POA: Diagnosis not present

## 2018-10-30 DIAGNOSIS — F439 Reaction to severe stress, unspecified: Secondary | ICD-10-CM

## 2018-10-30 NOTE — Progress Notes (Signed)
Patient ID: Erika Daugherty, female   DOB: 1965-11-30, 53 y.o.   MRN: [id number]   Virtual Visit via video Note  This visit type was conducted due to national recommendations for restrictions regarding the COVID-19 pandemic (e.g. social distancing).  This format is felt to be most appropriate for this patient at this time.  All issues noted in this document were discussed and addressed.  No physical exam was performed (except for noted visual exam findings with Video Visits).   I connected with Pixie Schnepf by a video enabled telemedicine application and verified that I am speaking with the correct person using two identifiers. Location patient: home Location provider: work Persons participating in the virtual visit: patient, provider  I discussed the limitations, risks, security and privacy concerns of performing an evaluation and management service by video and the availability of in person appointments. The patient expressed understanding and agreed to proceed.   Reason for visit: scheduled follow up.   HPI: She reports increased stress.  They are trying to move in to their new house.  Still not complete.  They are living in a camper for the last 3 weeks.  Trying to stay active.  No chest pain.  No sob.  No acid reflux.  No abdominal pain.  Bowels moving.  Has not been checking her blood pressure.  Feels has been under good control.  Discussed stress.  Discussed further intervention.  Does not feel needs any further intervention at this time.  Has good support.     ROS: See pertinent positives and negatives per HPI.  Past Medical History:  Diagnosis Date  . Allergy   . GERD (gastroesophageal reflux disease)   . Heart murmur     Past Surgical History:  Procedure Laterality Date  . VAGINAL DELIVERY     2  . WISDOM TOOTH EXTRACTION      Family History  Problem Relation Age of Onset  . Hypertension Mother   . Hyperlipidemia Mother        triglycerides  . Cancer Father       bile duct  . Heart disease Father 86    SOCIAL HX: reviewed.    Current Outpatient Medications:  .  amLODipine (NORVASC) 5 MG tablet, TAKE 1 TABLET (5 MG TOTAL) DAILY BY MOUTH., Disp: 90 tablet, Rfl: 1 .  metoprolol succinate (TOPROL-XL) 25 MG 24 hr tablet, Take 1 tablet (25 mg total) by mouth daily., Disp: 90 tablet, Rfl: 3 .  nitrofurantoin, macrocrystal-monohydrate, (MACROBID) 100 MG capsule, Take 1 capsule (100 mg total) by mouth 2 (two) times daily., Disp: 10 capsule, Rfl: 0  EXAM:  GENERAL: alert, oriented, appears well and in no acute distress  HEENT: atraumatic, conjunttiva clear, no obvious abnormalities on inspection of external nose and ears  NECK: normal movements of the head and neck  LUNGS: on inspection no signs of respiratory distress, breathing rate appears normal, no obvious gross SOB, gasping or wheezing  CV: no obvious cyanosis  PSYCH/NEURO: pleasant and cooperative, no obvious depression or anxiety, speech and thought processing grossly intact  ASSESSMENT AND PLAN:  Discussed the following assessment and plan:  Benign essential HTN Blood pressure has been under reasonable control.  Follow pressures.  Follow metabolic panel.    Stress Increased stress as outlined.  Discussed with her today.  Has good support.  Does not feel she needs any further intervention.  Follow.      I discussed the assessment and treatment plan with the patient. The  patient was provided an opportunity to ask questions and all were answered. The patient agreed with the plan and demonstrated an understanding of the instructions.   The patient was advised to call back or seek an in-person evaluation if the symptoms worsen or if the condition fails to improve as anticipated.   Einar Pheasant, MD

## 2018-11-03 ENCOUNTER — Encounter: Payer: Self-pay | Admitting: Internal Medicine

## 2018-11-03 NOTE — Assessment & Plan Note (Signed)
Increased stress as outlined.  Discussed with her today.  Has good support.  Does not feel she needs any further intervention.  Follow.

## 2018-11-03 NOTE — Assessment & Plan Note (Signed)
Blood pressure has been under reasonable control.  Follow pressures.  Follow metabolic panel.   

## 2019-01-17 ENCOUNTER — Telehealth: Payer: Self-pay

## 2019-01-17 DIAGNOSIS — I10 Essential (primary) hypertension: Secondary | ICD-10-CM

## 2019-01-17 DIAGNOSIS — Z13220 Encounter for screening for lipoid disorders: Secondary | ICD-10-CM

## 2019-01-17 NOTE — Telephone Encounter (Signed)
Copied from [place] [id number]. Topic: General - Inquiry >> Jan 17, 2019  3:58 PM Rainey Pines A wrote: Patient stated that she would like a callback in regards to having orders placed for lab work .

## 2019-01-24 NOTE — Telephone Encounter (Signed)
Patient has been scheduled for fasting labs on 02/12/19. She will need orders.

## 2019-01-25 NOTE — Telephone Encounter (Signed)
Orders placed for fasting labs.

## 2019-02-12 ENCOUNTER — Other Ambulatory Visit: Payer: Self-pay

## 2019-02-12 ENCOUNTER — Other Ambulatory Visit (INDEPENDENT_AMBULATORY_CARE_PROVIDER_SITE_OTHER): Payer: BC Managed Care – PPO

## 2019-02-12 DIAGNOSIS — I10 Essential (primary) hypertension: Secondary | ICD-10-CM

## 2019-02-12 DIAGNOSIS — Z13220 Encounter for screening for lipoid disorders: Secondary | ICD-10-CM | POA: Diagnosis not present

## 2019-02-12 LAB — COMPREHENSIVE METABOLIC PANEL
ALT: 22 U/L (ref 0–35)
AST: 22 U/L (ref 0–37)
Albumin: 4.2 g/dL (ref 3.5–5.2)
Alkaline Phosphatase: 95 U/L (ref 39–117)
BUN: 13 mg/dL (ref 6–23)
CO2: 31 mEq/L (ref 19–32)
Calcium: 9.3 mg/dL (ref 8.4–10.5)
Chloride: 103 mEq/L (ref 96–112)
Creatinine, Ser: 0.77 mg/dL (ref 0.40–1.20)
GFR: 78.84 mL/min (ref >60.00)
Glucose, Bld: 81 mg/dL (ref 70–99)
Potassium: 3.3 mEq/L — ABNORMAL LOW (ref 3.5–5.1)
Sodium: 142 mEq/L (ref 135–145)
Total Bilirubin: 0.8 mg/dL (ref 0.2–1.2)
Total Protein: 7.9 g/dL (ref 6.0–8.3)

## 2019-02-12 LAB — CBC WITH DIFFERENTIAL/PLATELET
Basophils Absolute: 0 10*3/uL (ref 0.0–0.1)
Basophils Relative: 0.7 % (ref 0.0–3.0)
Eosinophils Absolute: 0.1 10*3/uL (ref 0.0–0.7)
Eosinophils Relative: 2.1 % (ref 0.0–5.0)
HCT: 39.6 % (ref 36.0–46.0)
Hemoglobin: 13.5 g/dL (ref 12.0–15.0)
Lymphocytes Relative: 43.9 % (ref 12.0–46.0)
Lymphs Abs: 1.8 10*3/uL (ref 0.7–4.0)
MCHC: 34 g/dL (ref 30.0–36.0)
MCV: 91.2 fl (ref 78.0–100.0)
Monocytes Absolute: 0.4 10*3/uL (ref 0.1–1.0)
Monocytes Relative: 10.9 % (ref 3.0–12.0)
Neutro Abs: 1.7 10*3/uL (ref 1.4–7.7)
Neutrophils Relative %: 42.4 % — ABNORMAL LOW (ref 43.0–77.0)
Platelets: 212 10*3/uL (ref 150.0–400.0)
RBC: 4.34 Mil/uL (ref 3.87–5.11)
RDW: 12.2 % (ref 11.5–15.5)
WBC: 4.1 10*3/uL (ref 4.0–10.5)

## 2019-02-12 LAB — LIPID PANEL
Cholesterol: 139 mg/dL (ref 0–200)
HDL: 52.5 mg/dL (ref >39.00)
LDL Cholesterol: 68 mg/dL (ref 0–99)
NonHDL: 86.03
Total CHOL/HDL Ratio: 3
Triglycerides: 89 mg/dL (ref 0.0–149.0)
VLDL: 17.8 mg/dL (ref 0.0–40.0)

## 2019-02-12 LAB — TSH: TSH: 3.88 u[IU]/mL (ref 0.35–4.50)

## 2019-02-14 ENCOUNTER — Ambulatory Visit (INDEPENDENT_AMBULATORY_CARE_PROVIDER_SITE_OTHER): Payer: BC Managed Care – PPO | Admitting: Internal Medicine

## 2019-02-14 ENCOUNTER — Other Ambulatory Visit: Payer: Self-pay

## 2019-02-14 ENCOUNTER — Encounter: Payer: Self-pay | Admitting: Internal Medicine

## 2019-02-14 VITALS — BP 136/80 | HR 54 | Temp 96.8°F | Resp 16 | Ht 64.5 in | Wt 132.5 lb

## 2019-02-14 DIAGNOSIS — E876 Hypokalemia: Secondary | ICD-10-CM

## 2019-02-14 DIAGNOSIS — Z0000 Encounter for general adult medical examination without abnormal findings: Secondary | ICD-10-CM | POA: Diagnosis not present

## 2019-02-14 DIAGNOSIS — Z23 Encounter for immunization: Secondary | ICD-10-CM

## 2019-02-14 DIAGNOSIS — I10 Essential (primary) hypertension: Secondary | ICD-10-CM | POA: Diagnosis not present

## 2019-02-14 DIAGNOSIS — F439 Reaction to severe stress, unspecified: Secondary | ICD-10-CM | POA: Diagnosis not present

## 2019-02-14 NOTE — Progress Notes (Signed)
Patient ID: Erika Daugherty, female   DOB: [birth year], 53 y.o.   MRN: [id number]   Subjective:    Patient ID: Erika Daugherty, female    DOB: [birth year], 53 y.o.   MRN: [id number]  HPI  Patient here for her physical exam.  She reports she is doing relatively well.  Increased stress with moving into her house, etc.  Overall she feels she is handling things well.  Does not feel needs any further intervention.  Trying to stay active. No chest pain.   No sob.  No acid reflux.  No abdominal pain.  Bowels moving.  States had blood pressure checked at work.  Blood pressure ok.     Past Medical History:  Diagnosis Date  . Allergy   . GERD (gastroesophageal reflux disease)   . Heart murmur    Past Surgical History:  Procedure Laterality Date  . VAGINAL DELIVERY     2  . WISDOM TOOTH EXTRACTION     Family History  Problem Relation Age of Onset  . Hypertension Mother   . Hyperlipidemia Mother        triglycerides  . Cancer Father        bile duct  . Heart disease Father 24   Social History   Socioeconomic History  . Marital status: Married    Spouse name: Not on file  . Number of children: Not on file  . Years of education: Not on file  . Highest education level: Not on file  Occupational History  . Not on file  Social Needs  . Financial resource strain: Not on file  . Food insecurity    Worry: Not on file    Inability: Not on file  . Transportation needs    Medical: Not on file    Non-medical: Not on file  Tobacco Use  . Smoking status: Never Smoker  . Smokeless tobacco: Never Used  Substance and Sexual Activity  . Alcohol use: No    Comment: rare  . Drug use: No  . Sexual activity: Not on file  Lifestyle  . Physical activity    Days per week: Not on file    Minutes per session: Not on file  . Stress: Not on file  Relationships  . Social Herbalist on phone: Not on file    Gets together: Not on file    Attends religious service: Not on file   Active member of club or organization: Not on file    Attends meetings of clubs or organizations: Not on file    Relationship status: Not on file  Other Topics Concern  . Not on file  Social History Narrative   Lives in [place], [place] with husband and two children (16YO, 11YO) and dog.      Work - Arboriculturist   Diet - relative healthy   Exercise - walk or elliptical 77min 5 days per week    Outpatient Encounter Medications as of 02/14/2019  Medication Sig  . amLODipine (NORVASC) 5 MG tablet TAKE 1 TABLET (5 MG TOTAL) DAILY BY MOUTH.  . metoprolol succinate (TOPROL-XL) 25 MG 24 hr tablet Take 1 tablet (25 mg total) by mouth daily.  . [DISCONTINUED] nitrofurantoin, macrocrystal-monohydrate, (MACROBID) 100 MG capsule Take 1 capsule (100 mg total) by mouth 2 (two) times daily.   No facility-administered encounter medications on file as of 02/14/2019.     Review of Systems  Constitutional: Negative for appetite change and unexpected weight change.  HENT: Negative for congestion and sinus pressure.   Eyes: Negative for pain and visual disturbance.  Respiratory: Negative for cough, chest tightness and shortness of breath.   Cardiovascular: Negative for chest pain, palpitations and leg swelling.  Gastrointestinal: Negative for abdominal pain, diarrhea, nausea and vomiting.  Genitourinary: Negative for difficulty urinating and dysuria.  Musculoskeletal: Negative for joint swelling and myalgias.  Skin: Negative for color change and rash.  Neurological: Negative for dizziness, light-headedness and headaches.  Hematological: Negative for adenopathy. Does not bruise/bleed easily.  Psychiatric/Behavioral: Negative for agitation and dysphoric mood.       Objective:    Physical Exam Constitutional:      General: She is not in acute distress.    Appearance: Normal appearance.  HENT:     Head: Normocephalic and atraumatic.     Right Ear: External ear normal.     Left Ear: External ear  normal.  Eyes:     General: No scleral icterus.       Right eye: No discharge.        Left eye: No discharge.     Conjunctiva/sclera: Conjunctivae normal.  Neck:     Musculoskeletal: Neck supple. No muscular tenderness.     Thyroid: No thyromegaly.  Cardiovascular:     Rate and Rhythm: Normal rate and regular rhythm.  Pulmonary:     Effort: No respiratory distress.     Breath sounds: Normal breath sounds. No wheezing.  Abdominal:     General: Bowel sounds are normal.     Palpations: Abdomen is soft.     Tenderness: There is no abdominal tenderness.  Musculoskeletal:        General: No swelling or tenderness.  Lymphadenopathy:     Cervical: No cervical adenopathy.  Skin:    Findings: No erythema or rash.  Neurological:     Mental Status: She is alert.  Psychiatric:        Mood and Affect: Mood normal.        Behavior: Behavior normal.     BP 136/80   Pulse (!) 54   Temp (!) 96.8 F (36 C)   Resp 16   Wt 132 lb 8 oz (60.1 kg)   SpO2 99%   BMI 22.74 kg/m  Wt Readings from Last 3 Encounters:  02/14/19 132 lb 8 oz (60.1 kg)  04/26/18 136 lb (61.7 kg)  04/20/18 136 lb (61.7 kg)     Lab Results  Component Value Date   WBC 4.1 02/12/2019   HGB 13.5 02/12/2019   HCT 39.6 02/12/2019   PLT 212.0 02/12/2019   GLUCOSE 81 02/12/2019   CHOL 139 02/12/2019   TRIG 89.0 02/12/2019   HDL 52.50 02/12/2019   LDLCALC 68 02/12/2019   ALT 22 02/12/2019   AST 22 02/12/2019   NA 142 02/12/2019   K 3.3 (L) 02/12/2019   CL 103 02/12/2019   CREATININE 0.77 02/12/2019   BUN 13 02/12/2019   CO2 31 02/12/2019   TSH 3.88 02/12/2019   HGBA1C 5.3 12/14/2015   MICROALBUR 0.8 11/05/2013       Assessment & Plan:   Problem List Items Addressed This Visit    Annual physical exam    Scheduled for physical today.  Gets pap smear through gyn.  Discussed colonoscopy.        Benign essential HTN    Blood pressure as outlined.  Continue current medication regimen.  Follow  pressures.  Follow metabolic panel.  Stress    Increased stress as outlined.  Feels she is coping well.  Does not feel needs any further intervention.  Follow.         Other Visit Diagnoses    Hypokalemia    -  Primary   Relevant Orders   Potassium   Need for immunization against influenza       Relevant Orders   Flu Vaccine QUAD 36+ mos IM (Completed)       Einar Pheasant, MD

## 2019-02-20 ENCOUNTER — Encounter: Payer: Self-pay | Admitting: Internal Medicine

## 2019-02-20 NOTE — Assessment & Plan Note (Signed)
Blood pressure as outlined.  Continue current medication regimen.  Follow pressures.  Follow metabolic panel.  

## 2019-02-20 NOTE — Assessment & Plan Note (Signed)
Scheduled for physical today.  Gets pap smear through gyn.  Discussed colonoscopy.

## 2019-02-20 NOTE — Assessment & Plan Note (Signed)
Increased stress as outlined.  Feels she is coping well.  Does not feel needs any further intervention.  Follow.

## 2019-02-25 ENCOUNTER — Other Ambulatory Visit: Payer: BC Managed Care – PPO

## 2019-03-04 ENCOUNTER — Other Ambulatory Visit: Payer: Self-pay | Admitting: *Deleted

## 2019-03-04 ENCOUNTER — Telehealth: Payer: Self-pay | Admitting: *Deleted

## 2019-03-04 DIAGNOSIS — Z20822 Contact with and (suspected) exposure to covid-19: Secondary | ICD-10-CM

## 2019-03-04 NOTE — Telephone Encounter (Signed)
Copied from [place] [phone]. Topic: General - Other >> Mar 04, 2019  8:07 AM Leward Quan A wrote: Reason for CRM: Patient called to say that she was exposed to covid 12 days ago and have tested negative twice since then and have no symptoms. But stated that over the weekend she started experiencing a slight sore throat but no other symptoms asking if Dr Nicki Reaper recommend that she get tested [facility]. Please call patient at Ph# [phone]

## 2019-03-04 NOTE — Telephone Encounter (Signed)
Yesterday started getting post nasal drip and sore throat.  Patient is going to [facility] for testing today since shechas known exposure, Patient has tested negative X 2 but with new on set symptoms recommended testing.Patient has 51 year old mother coming to home this weekend, advised patient I did not recommend even with negative test with her having symptoms of sore throat and post nasal drip. Scheduled with PCP for tomorrow virtual at 10 am.

## 2019-03-04 NOTE — Telephone Encounter (Signed)
Noted  

## 2019-03-05 ENCOUNTER — Ambulatory Visit (INDEPENDENT_AMBULATORY_CARE_PROVIDER_SITE_OTHER): Payer: BC Managed Care – PPO | Admitting: Internal Medicine

## 2019-03-05 ENCOUNTER — Other Ambulatory Visit: Payer: Self-pay

## 2019-03-05 ENCOUNTER — Telehealth: Payer: Self-pay | Admitting: Internal Medicine

## 2019-03-05 ENCOUNTER — Encounter: Payer: Self-pay | Admitting: Internal Medicine

## 2019-03-05 DIAGNOSIS — Z1211 Encounter for screening for malignant neoplasm of colon: Secondary | ICD-10-CM

## 2019-03-05 DIAGNOSIS — R0981 Nasal congestion: Secondary | ICD-10-CM

## 2019-03-05 LAB — NOVEL CORONAVIRUS, NAA: SARS-CoV-2, NAA: NOT DETECTED

## 2019-03-05 NOTE — Telephone Encounter (Signed)
My chart message sent to pt for update.   

## 2019-03-05 NOTE — Progress Notes (Signed)
Patient ID: Erika Daugherty, female   DOB: [birth year], 53 y.o.   MRN: [id number]   Virtual Visit via video Note  This visit type was conducted due to national recommendations for restrictions regarding the COVID-19 pandemic (e.g. social distancing).  This format is felt to be most appropriate for this patient at this time.  All issues noted in this document were discussed and addressed.  No physical exam was performed (except for noted visual exam findings with Video Visits).   I connected with Jameisha Grall by a video enabled telemedicine application and verified that I am speaking with the correct person using two identifiers. Location patient: home Location provider: work Persons participating in the virtual visit: patient, provider  I discussed the limitations, risks, security and privacy concerns of performing an evaluation and management service by video and the availability of in person appointments. The patient expressed understanding and agreed to proceed.   Reason for visit: work in appt  HPI: Reports her daughter tested positive for covid approximately 12 days ago.  Went back to school yesterday.  She has been checked 2x and negative.  Started having slight sore throat and drainage- this past weekend.  Increased post nasal drainage.  Some left sinus pressure.  No fever.  No chest congestion or cough.  No sob.  No acid reflux.  No nausea or vomiting.  No diarrhea.    ROS: See pertinent positives and negatives per HPI.  Past Medical History:  Diagnosis Date  . Allergy   . GERD (gastroesophageal reflux disease)   . Heart murmur     Past Surgical History:  Procedure Laterality Date  . VAGINAL DELIVERY     2  . WISDOM TOOTH EXTRACTION      Family History  Problem Relation Age of Onset  . Hypertension Mother   . Hyperlipidemia Mother        triglycerides  . Cancer Father        bile duct  . Heart disease Father 95    SOCIAL HX: reviewed.    Current Outpatient  Medications:  .  metoprolol succinate (TOPROL-XL) 25 MG 24 hr tablet, Take 1 tablet (25 mg total) by mouth daily., Disp: 90 tablet, Rfl: 3 .  amLODipine (NORVASC) 5 MG tablet, TAKE 1 TABLET BY MOUTH DAILY, Disp: 90 tablet, Rfl: 1  EXAM:  GENERAL: alert, oriented, appears well and in no acute distress  HEENT: atraumatic, conjunttiva clear, no obvious abnormalities on inspection of external nose and ears  NECK: normal movements of the head and neck  LUNGS: on inspection no signs of respiratory distress, breathing rate appears normal, no obvious gross SOB, gasping or wheezing  CV: no obvious cyanosis  PSYCH/NEURO: pleasant and cooperative, no obvious depression or anxiety, speech and thought processing grossly intact  ASSESSMENT AND PLAN:  Discussed the following assessment and plan:  Congestion of nasal sinus Daughter with covid.  She went back to school yesterday.  Has been self quarantined.  Noticed sore throat and drainage.  Some sinus congestion.  Recheck covid.  Continue to self quarantine per guidelines.  No fever.  nasacort and robitussin as directed.  Call with update.      I discussed the assessment and treatment plan with the patient. The patient was provided an opportunity to ask questions and all were answered. The patient agreed with the plan and demonstrated an understanding of the instructions.   The patient was advised to call back or seek an in-person evaluation if the symptoms  worsen or if the condition fails to improve as anticipated.   Einar Pheasant, MD

## 2019-03-06 ENCOUNTER — Other Ambulatory Visit: Payer: Self-pay | Admitting: Internal Medicine

## 2019-03-10 ENCOUNTER — Encounter: Payer: Self-pay | Admitting: Internal Medicine

## 2019-03-10 DIAGNOSIS — R0981 Nasal congestion: Secondary | ICD-10-CM | POA: Insufficient documentation

## 2019-03-10 NOTE — Assessment & Plan Note (Signed)
Daughter with covid.  She went back to school yesterday.  Has been self quarantined.  Noticed sore throat and drainage.  Some sinus congestion.  Recheck covid.  Continue to self quarantine per guidelines.  No fever.  nasacort and robitussin as directed.  Call with update.

## 2019-03-12 NOTE — Telephone Encounter (Signed)
Order placed for GI referral.   

## 2019-03-12 NOTE — Addendum Note (Signed)
Addended by: Alisa Graff on: 03/12/2019 05:13 PM   Modules accepted: Orders

## 2019-03-21 ENCOUNTER — Telehealth: Payer: Self-pay

## 2019-03-21 NOTE — Telephone Encounter (Signed)
Returned patients call to schedule her colonoscopy.  LVM for her to call back.  Thanks [employer]

## 2019-04-02 ENCOUNTER — Encounter: Payer: Self-pay | Admitting: *Deleted

## 2019-04-03 ENCOUNTER — Other Ambulatory Visit: Payer: Self-pay

## 2019-04-03 ENCOUNTER — Telehealth: Payer: Self-pay

## 2019-04-03 DIAGNOSIS — Z1211 Encounter for screening for malignant neoplasm of colon: Secondary | ICD-10-CM

## 2019-04-03 NOTE — Telephone Encounter (Signed)
Gastroenterology Pre-Procedure Review  Request Date: Monday 04/22/19 Requesting Physician: Dr. Bonna Gains  PATIENT REVIEW QUESTIONS: The patient responded to the following health history questions as indicated:    1. Are you having any GI issues? no 2. Do you have a personal history of Polyps? no 3. Do you have a family history of Colon Cancer or Polyps? father had possibly bile duct cancer 4. Diabetes Mellitus? no 5. Joint replacements in the past 12 months?no 6. Major health problems in the past 3 months?no 7. Any artificial heart valves, MVP, or defibrillator?no    MEDICATIONS & ALLERGIES:    Patient reports the following regarding taking any anticoagulation/antiplatelet therapy:   Plavix, Coumadin, Eliquis, Xarelto, Lovenox, Pradaxa, Brilinta, or Effient? no Aspirin? no  Patient confirms/reports the following medications:  Current Outpatient Medications  Medication Sig Dispense Refill  . amLODipine (NORVASC) 5 MG tablet TAKE 1 TABLET BY MOUTH DAILY 90 tablet 1  . metoprolol succinate (TOPROL-XL) 25 MG 24 hr tablet Take 1 tablet (25 mg total) by mouth daily. 90 tablet 3   No current facility-administered medications for this visit.     Patient confirms/reports the following allergies:  Allergies  Allergen Reactions  . Erythromycin Diarrhea  . Lisinopril Swelling  . Prednisone Palpitations  . Strawberry Extract Rash    No orders of the defined types were placed in this encounter.   AUTHORIZATION INFORMATION Primary Insurance: 1D#: Group #:  Secondary Insurance: 1D#: Group #:  SCHEDULE INFORMATION: Date: 04/22/19 Time: Location:[facility]

## 2019-04-10 ENCOUNTER — Other Ambulatory Visit: Payer: Self-pay

## 2019-04-10 ENCOUNTER — Encounter: Payer: Self-pay | Admitting: Internal Medicine

## 2019-04-10 DIAGNOSIS — Z1211 Encounter for screening for malignant neoplasm of colon: Secondary | ICD-10-CM

## 2019-04-10 MED ORDER — NA SULFATE-K SULFATE-MG SULF 17.5-3.13-1.6 GM/177ML PO SOLN: 1.0000 | Freq: Once | ORAL | 0 refills | Status: AC

## 2019-04-11 ENCOUNTER — Telehealth: Payer: Self-pay

## 2019-04-11 NOTE — Telephone Encounter (Signed)
LVM to let patient know that I contacted pre-admit in regards to the time of her covid testing.  Caryl Pina in pre-admit has placed her on the schedule for 3:15pm 04/18/19.  Thanks [employer]

## 2019-04-16 ENCOUNTER — Telehealth: Payer: Self-pay | Admitting: Gastroenterology

## 2019-04-16 NOTE — Telephone Encounter (Signed)
Patients colonoscopy has been rescheduled to Monday January 25th at [facility] with Dr. Bonna Gains.  Patient has been advised of new COVID Test date of Thursday January 21st at [facility].  Referral updated and Endoscopy has been notified of date change.  Thanks [employer]

## 2019-04-16 NOTE — Telephone Encounter (Signed)
Pt left vm to r/s her procedure for 04/22/19 to sometime in January

## 2019-04-18 ENCOUNTER — Other Ambulatory Visit: Admission: RE | Admit: 2019-04-18 | Payer: BC Managed Care – PPO | Source: Ambulatory Visit

## 2019-05-04 ENCOUNTER — Other Ambulatory Visit: Payer: Self-pay | Admitting: Internal Medicine

## 2019-05-13 ENCOUNTER — Telehealth: Payer: Self-pay

## 2019-05-13 NOTE — Telephone Encounter (Signed)
LVM for pt to call office back in regard to rescheduling her 05/27/19 colonoscopy at [facility] with Dr. Bonna Gains due to scheduling changes at [facility] related to [place].  Thanks [employer]

## 2019-05-24 ENCOUNTER — Other Ambulatory Visit: Admission: RE | Admit: 2019-05-24 | Payer: BC Managed Care – PPO | Source: Ambulatory Visit

## 2019-05-27 ENCOUNTER — Encounter: Payer: Self-pay | Admitting: Internal Medicine

## 2019-05-27 ENCOUNTER — Encounter: Admission: RE | Payer: Self-pay | Source: Home / Self Care

## 2019-05-27 ENCOUNTER — Ambulatory Visit
Admission: RE | Admit: 2019-05-27 | Payer: BC Managed Care – PPO | Source: Home / Self Care | Admitting: Gastroenterology

## 2019-05-27 SURGERY — COLONOSCOPY WITH PROPOFOL
Anesthesia: General

## 2019-05-28 ENCOUNTER — Ambulatory Visit: Payer: BC Managed Care – PPO | Attending: Internal Medicine

## 2019-05-28 DIAGNOSIS — Z20822 Contact with and (suspected) exposure to covid-19: Secondary | ICD-10-CM

## 2019-05-29 LAB — NOVEL CORONAVIRUS, NAA: SARS-CoV-2, NAA: NOT DETECTED

## 2019-08-15 ENCOUNTER — Ambulatory Visit: Payer: BC Managed Care – PPO | Admitting: Internal Medicine

## 2019-08-15 ENCOUNTER — Other Ambulatory Visit: Payer: Self-pay

## 2019-08-15 ENCOUNTER — Encounter: Payer: Self-pay | Admitting: Internal Medicine

## 2019-08-15 VITALS — BP 120/70 | HR 61 | Temp 96.5°F | Ht 64.0 in | Wt 139.2 lb

## 2019-08-15 DIAGNOSIS — R0981 Nasal congestion: Secondary | ICD-10-CM | POA: Diagnosis not present

## 2019-08-15 DIAGNOSIS — I10 Essential (primary) hypertension: Secondary | ICD-10-CM | POA: Diagnosis not present

## 2019-08-15 DIAGNOSIS — F439 Reaction to severe stress, unspecified: Secondary | ICD-10-CM

## 2019-08-15 NOTE — Progress Notes (Signed)
Patient ID: Erika Daugherty, female   DOB: 1965/06/17, 54 y.o.   MRN: [id number]   Subjective:    Patient ID: Erika Daugherty, female    DOB: [birth year], 54 y.o.   MRN: [id number]  HPI This visit occurred during the SARS-CoV-2 public health emergency.  Safety protocols were in place, including screening questions prior to the visit, additional usage of staff PPE, and extensive cleaning of exam room while observing appropriate contact time as indicated for disinfecting solutions.  Patient here for a scheduled follow up.  She reports she is doing relatively well.  Changed jobs last week.  Decreased stress.  Doing more research.  Tries to stay active.  No chest pain. No sob.  Occasional acid reflux.  Not a significant issue for her.  Some allergies with increased pollen.  Using nasacort.  No chest congestion or cough reported.  No abdominal pain.  Bowels moving.  Planning for colonoscopy.     Past Medical History:  Diagnosis Date  . Allergy   . GERD (gastroesophageal reflux disease)   . Heart murmur    Past Surgical History:  Procedure Laterality Date  . VAGINAL DELIVERY     2  . WISDOM TOOTH EXTRACTION     Family History  Problem Relation Age of Onset  . Hypertension Mother   . Hyperlipidemia Mother        triglycerides  . Cancer Father        bile duct  . Heart disease Father 68   Social History   Socioeconomic History  . Marital status: Married    Spouse name: Not on file  . Number of children: Not on file  . Years of education: Not on file  . Highest education level: Not on file  Occupational History  . Not on file  Tobacco Use  . Smoking status: Never Smoker  . Smokeless tobacco: Never Used  Substance and Sexual Activity  . Alcohol use: No    Comment: rare  . Drug use: No  . Sexual activity: Not on file  Other Topics Concern  . Not on file  Social History Narrative   Lives in [place], [place] with husband and two children (16YO, 11YO) and dog.      Work - Research scientist (life sciences)   Diet - relative healthy   Exercise - walk or elliptical 64min 5 days per week   Social Determinants of Health   Financial Resource Strain:   . Difficulty of Paying Living Expenses:   Food Insecurity:   . Worried About Charity fundraiser in the Last Year:   . Arboriculturist in the Last Year:   Transportation Needs:   . Film/video editor (Medical):   Marland Kitchen Lack of Transportation (Non-Medical):   Physical Activity:   . Days of Exercise per Week:   . Minutes of Exercise per Session:   Stress:   . Feeling of Stress :   Social Connections:   . Frequency of Communication with Friends and Family:   . Frequency of Social Gatherings with Friends and Family:   . Attends Religious Services:   . Active Member of Clubs or Organizations:   . Attends Archivist Meetings:   Marland Kitchen Marital Status:     Outpatient Encounter Medications as of 08/15/2019  Medication Sig  . amLODipine (NORVASC) 5 MG tablet TAKE 1 TABLET BY MOUTH DAILY  . metoprolol succinate (TOPROL-XL) 25 MG 24 hr tablet TAKE 1 TABLET BY MOUTH EVERY DAY  No facility-administered encounter medications on file as of 08/15/2019.    Review of Systems  Constitutional: Negative for appetite change and unexpected weight change.  HENT: Negative for sinus pressure.        Some allergy symptoms.    Respiratory: Negative for cough, chest tightness and shortness of breath.   Cardiovascular: Negative for chest pain, palpitations and leg swelling.  Gastrointestinal: Negative for abdominal pain, diarrhea, nausea and vomiting.       Occasional acid reflux.    Genitourinary: Negative for difficulty urinating and dysuria.  Musculoskeletal: Negative for joint swelling and myalgias.  Skin: Negative for color change and rash.  Neurological: Negative for dizziness, light-headedness and headaches.  Psychiatric/Behavioral: Negative for agitation and dysphoric mood.       Objective:    Physical Exam Vitals reviewed.    Constitutional:      General: She is not in acute distress.    Appearance: Normal appearance.  HENT:     Head: Normocephalic and atraumatic.     Right Ear: External ear normal.     Left Ear: External ear normal.  Eyes:     General:        Right eye: No discharge.        Left eye: No discharge.     Conjunctiva/sclera: Conjunctivae normal.  Neck:     Thyroid: No thyromegaly.  Cardiovascular:     Rate and Rhythm: Normal rate and regular rhythm.  Pulmonary:     Effort: No respiratory distress.     Breath sounds: Normal breath sounds. No wheezing.  Abdominal:     General: Bowel sounds are normal.     Palpations: Abdomen is soft.     Tenderness: There is no abdominal tenderness.  Musculoskeletal:        General: No swelling or tenderness.     Cervical back: Neck supple. No tenderness.  Lymphadenopathy:     Cervical: No cervical adenopathy.  Skin:    Findings: No erythema or rash.  Neurological:     Mental Status: She is alert.  Psychiatric:        Mood and Affect: Mood normal.        Behavior: Behavior normal.     BP 120/70   Pulse 61   Temp (!) 96.5 F (35.8 C) (Temporal)   Ht 5\' 4"  (1.626 m)   Wt 139 lb 3.2 oz (63.1 kg)   SpO2 98%   BMI 23.89 kg/m  Wt Readings from Last 3 Encounters:  08/15/19 139 lb 3.2 oz (63.1 kg)  02/14/19 132 lb 8 oz (60.1 kg)  04/26/18 136 lb (61.7 kg)     Lab Results  Component Value Date   WBC 4.1 02/12/2019   HGB 13.5 02/12/2019   HCT 39.6 02/12/2019   PLT 212.0 02/12/2019   GLUCOSE 112 (H) 08/15/2019   CHOL 139 02/12/2019   TRIG 89.0 02/12/2019   HDL 52.50 02/12/2019   LDLCALC 68 02/12/2019   ALT 22 02/12/2019   AST 22 02/12/2019   NA 139 08/15/2019   K 3.6 08/15/2019   CL 102 08/15/2019   CREATININE 0.78 08/15/2019   BUN 15 08/15/2019   CO2 29 08/15/2019   TSH 3.88 02/12/2019   HGBA1C 5.3 12/14/2015   MICROALBUR 0.8 11/05/2013       Assessment & Plan:   Problem List Items Addressed This Visit    Benign  essential HTN - Primary    Blood pressure as outlined.  On recheck by me,  slightly increased.  Continue current medication.  Occasional spot check.  Follow pressures.  Check metabolic panel.       Relevant Orders   Basic metabolic panel (Completed)   Congestion of nasal sinus    Some allergies with pollen.  Nasacort.  Follow.        Stress    Just changed jobs last week.  Stress is better.  Overall she feels she is doing relatively well.  Follow.            Einar Pheasant, MD

## 2019-08-16 LAB — BASIC METABOLIC PANEL
BUN: 15 mg/dL (ref 6–23)
CO2: 29 mEq/L (ref 19–32)
Calcium: 9.4 mg/dL (ref 8.4–10.5)
Chloride: 102 mEq/L (ref 96–112)
Creatinine, Ser: 0.78 mg/dL (ref 0.40–1.20)
GFR: 77.53 mL/min (ref >60.00)
Glucose, Bld: 112 mg/dL — ABNORMAL HIGH (ref 70–99)
Potassium: 3.6 mEq/L (ref 3.5–5.1)
Sodium: 139 mEq/L (ref 135–145)

## 2019-08-18 ENCOUNTER — Encounter: Payer: Self-pay | Admitting: Internal Medicine

## 2019-08-18 NOTE — Assessment & Plan Note (Signed)
Blood pressure as outlined.  On recheck by me, slightly increased.  Continue current medication.  Occasional spot check.  Follow pressures.  Check metabolic panel.

## 2019-08-18 NOTE — Assessment & Plan Note (Signed)
Some allergies with pollen.  Nasacort.  Follow.

## 2019-08-18 NOTE — Assessment & Plan Note (Signed)
Just changed jobs last week.  Stress is better.  Overall she feels she is doing relatively well.  Follow.

## 2019-08-20 ENCOUNTER — Encounter: Payer: Self-pay | Admitting: Internal Medicine

## 2019-08-30 ENCOUNTER — Other Ambulatory Visit: Payer: Self-pay | Admitting: Internal Medicine

## 2019-10-08 ENCOUNTER — Encounter: Payer: Self-pay | Admitting: Internal Medicine

## 2019-10-08 ENCOUNTER — Other Ambulatory Visit: Payer: Self-pay

## 2019-10-08 ENCOUNTER — Ambulatory Visit: Payer: BC Managed Care – PPO | Admitting: Internal Medicine

## 2019-10-08 VITALS — BP 132/90 | HR 63 | Temp 97.0°F | Ht 64.02 in | Wt 140.0 lb

## 2019-10-08 DIAGNOSIS — I10 Essential (primary) hypertension: Secondary | ICD-10-CM | POA: Diagnosis not present

## 2019-10-08 DIAGNOSIS — N3000 Acute cystitis without hematuria: Secondary | ICD-10-CM | POA: Diagnosis not present

## 2019-10-08 MED ORDER — NITROFURANTOIN MONOHYD MACRO 100 MG PO CAPS: 100.0000 mg | ORAL_CAPSULE | Freq: Two times a day (BID) | ORAL | 0 refills | Status: DC

## 2019-10-08 NOTE — Addendum Note (Signed)
Addended by: Leeanne Rio on: 10/08/2019 02:25 PM   Modules accepted: Orders

## 2019-10-08 NOTE — Progress Notes (Signed)
Chief Complaint  Patient presents with  . Urinary Frequency    Pt c/o urinary frequency and burning x1day   Acute visit UTI with h/o UTI this am had urgency and burning after 1 hr of urination had to go again tried increased water only. No blood seen  HTN uncontrolled on toprol xl 25 mg qd and norvasc 5 mg qd repeat 132/90 f/u with PCP pt states this is normal but trends down for her    Review of Systems  Gastrointestinal: Negative for abdominal pain.  Genitourinary: Positive for dysuria, frequency and urgency. Negative for hematuria.  Musculoskeletal: Negative for back pain.  Neurological: Negative for headaches.   Past Medical History:  Diagnosis Date  . Allergy   . GERD (gastroesophageal reflux disease)   . Heart murmur    Past Surgical History:  Procedure Laterality Date  . VAGINAL DELIVERY     2  . WISDOM TOOTH EXTRACTION     Family History  Problem Relation Age of Onset  . Hypertension Mother   . Hyperlipidemia Mother        triglycerides  . Cancer Father        bile duct  . Heart disease Father 11   Social History   Socioeconomic History  . Marital status: Married    Spouse name: Not on file  . Number of children: Not on file  . Years of education: Not on file  . Highest education level: Not on file  Occupational History  . Not on file  Tobacco Use  . Smoking status: Never Smoker  . Smokeless tobacco: Never Used  Substance and Sexual Activity  . Alcohol use: No    Comment: rare  . Drug use: No  . Sexual activity: Not on file  Other Topics Concern  . Not on file  Social History Narrative   Lives in [place], [place] with husband and two children (16YO, 11YO) and dog.      Work - Arboriculturist   Diet - relative healthy   Exercise - walk or elliptical 33min 5 days per week   Social Determinants of Health   Financial Resource Strain:   . Difficulty of Paying Living Expenses:   Food Insecurity:   . Worried About Charity fundraiser in the Last Year:    . Arboriculturist in the Last Year:   Transportation Needs:   . Film/video editor (Medical):   Marland Kitchen Lack of Transportation (Non-Medical):   Physical Activity:   . Days of Exercise per Week:   . Minutes of Exercise per Session:   Stress:   . Feeling of Stress :   Social Connections:   . Frequency of Communication with Friends and Family:   . Frequency of Social Gatherings with Friends and Family:   . Attends Religious Services:   . Active Member of Clubs or Organizations:   . Attends Archivist Meetings:   Marland Kitchen Marital Status:   Intimate Partner Violence:   . Fear of Current or Ex-Partner:   . Emotionally Abused:   Marland Kitchen Physically Abused:   . Sexually Abused:    Current Meds  Medication Sig  . amLODipine (NORVASC) 5 MG tablet TAKE 1 TABLET BY MOUTH DAILY  . metoprolol succinate (TOPROL-XL) 25 MG 24 hr tablet TAKE 1 TABLET BY MOUTH EVERY DAY   Allergies  Allergen Reactions  . Erythromycin Diarrhea  . Lisinopril Swelling  . Prednisone Palpitations  . Strawberry Extract Rash   Recent Results (  from the past 2160 hour(s))  Basic metabolic panel     Status: Abnormal   Collection Time: 08/15/19  4:25 PM  Result Value Ref Range   Sodium 139 135 - 145 mEq/L   Potassium 3.6 3.5 - 5.1 mEq/L   Chloride 102 96 - 112 mEq/L   CO2 29 19 - 32 mEq/L   Glucose, Bld 112 (H) 70 - 99 mg/dL   BUN 15 6 - 23 mg/dL   Creatinine, Ser 0.78 0.40 - 1.20 mg/dL   GFR 77.53 >60.00 mL/min   Calcium 9.4 8.4 - 10.5 mg/dL   Objective  Body mass index is 24.02 kg/m. Wt Readings from Last 3 Encounters:  10/08/19 140 lb (63.5 kg)  08/15/19 139 lb 3.2 oz (63.1 kg)  02/14/19 132 lb 8 oz (60.1 kg)   Temp Readings from Last 3 Encounters:  10/08/19 (!) 97 F (36.1 C)  08/15/19 (!) 96.5 F (35.8 C) (Temporal)  02/14/19 (!) 96.8 F (36 C)   BP Readings from Last 3 Encounters:  10/08/19 132/90  08/15/19 120/70  02/14/19 136/80   Pulse Readings from Last 3 Encounters:  10/08/19 63   08/15/19 61  02/14/19 (!) 54    Physical Exam Vitals and nursing note reviewed.  Constitutional:      Appearance: Normal appearance. She is well-developed and well-groomed.  HENT:     Head: Normocephalic and atraumatic.  Eyes:     Conjunctiva/sclera: Conjunctivae normal.     Pupils: Pupils are equal, round, and reactive to light.  Cardiovascular:     Rate and Rhythm: Normal rate and regular rhythm.     Heart sounds: Normal heart sounds. No murmur.  Pulmonary:     Effort: Pulmonary effort is normal.     Breath sounds: Normal breath sounds.  Abdominal:     General: There is no distension.     Tenderness: There is abdominal tenderness in the left lower quadrant. There is no right CVA tenderness or left CVA tenderness.     Comments: Mild LLQ ttp  Skin:    General: Skin is warm and dry.  Neurological:     General: No focal deficit present.     Mental Status: She is alert and oriented to person, place, and time. Mental status is at baseline.     Gait: Gait normal.  Psychiatric:        Attention and Perception: Attention and perception normal.        Mood and Affect: Mood and affect normal.        Speech: Speech normal.        Behavior: Behavior normal. Behavior is cooperative.        Thought Content: Thought content normal.        Cognition and Memory: Cognition and memory normal.        Judgment: Judgment normal.     Assessment  Plan  Acute cystitis without hematuria - Plan: Urinalysis, Routine w reflex microscopic, Urine Culture, nitrofurantoin, macrocrystal-monohydrate, (MACROBID) 100 MG capsule bid   Essential hypertension  F/u with PCP  Monitor BP  Cont meds  Provider: Dr. Olivia Mackie McLean-Scocuzza-Internal Medicine

## 2019-10-08 NOTE — Patient Instructions (Addendum)
AZO with pyridium otc  Pure cranberry juice   Goal <130/<80  Amazon upper arm blood pressure cuff    Urinary Tract Infection, Adult  A urinary tract infection (UTI) is an infection of any part of the urinary tract. The urinary tract includes the kidneys, ureters, bladder, and urethra. These organs make, store, and get rid of urine in the body. Your health care provider may use other names to describe the infection. An upper UTI affects the ureters and kidneys (pyelonephritis). A lower UTI affects the bladder (cystitis) and urethra (urethritis). What are the causes? Most urinary tract infections are caused by bacteria in your genital area, around the entrance to your urinary tract (urethra). These bacteria grow and cause inflammation of your urinary tract. What increases the risk? You are more likely to develop this condition if:  You have a urinary catheter that stays in place (indwelling).  You are not able to control when you urinate or have a bowel movement (you have incontinence).  You are female and you: ? Use a spermicide or diaphragm for birth control. ? Have low estrogen levels. ? Are pregnant.  You have certain genes that increase your risk (genetics).  You are sexually active.  You take antibiotic medicines.  You have a condition that causes your flow of urine to slow down, such as: ? An enlarged prostate, if you are female. ? Blockage in your urethra (stricture). ? A kidney stone. ? A nerve condition that affects your bladder control (neurogenic bladder). ? Not getting enough to drink, or not urinating often.  You have certain medical conditions, such as: ? Diabetes. ? A weak disease-fighting system (immunesystem). ? Sickle cell disease. ? Gout. ? Spinal cord injury. What are the signs or symptoms? Symptoms of this condition include:  Needing to urinate right away (urgently).  Frequent urination or passing small amounts of urine frequently.  Pain or  burning with urination.  Blood in the urine.  Urine that smells bad or unusual.  Trouble urinating.  Cloudy urine.  Vaginal discharge, if you are female.  Pain in the abdomen or the lower back. You may also have:  Vomiting or a decreased appetite.  Confusion.  Irritability or tiredness.  A fever.  Diarrhea. The first symptom in older adults may be confusion. In some cases, they may not have any symptoms until the infection has worsened. How is this diagnosed? This condition is diagnosed based on your medical history and a physical exam. You may also have other tests, including:  Urine tests.  Blood tests.  Tests for sexually transmitted infections (STIs). If you have had more than one UTI, a cystoscopy or imaging studies may be done to determine the cause of the infections. How is this treated? Treatment for this condition includes:  Antibiotic medicine.  Over-the-counter medicines to treat discomfort.  Drinking enough water to stay hydrated. If you have frequent infections or have other conditions such as a kidney stone, you may need to see a health care provider who specializes in the urinary tract (urologist). In rare cases, urinary tract infections can cause sepsis. Sepsis is a life-threatening condition that occurs when the body responds to an infection. Sepsis is treated in the hospital with IV antibiotics, fluids, and other medicines. Follow these instructions at home:  Medicines  Take over-the-counter and prescription medicines only as told by your health care provider.  If you were prescribed an antibiotic medicine, take it as told by your health care provider. Do not stop  using the antibiotic even if you start to feel better. General instructions  Make sure you: ? Empty your bladder often and completely. Do not hold urine for long periods of time. ? Empty your bladder after sex. ? Wipe from front to back after a bowel movement if you are female. Use  each tissue one time when you wipe.  Drink enough fluid to keep your urine pale yellow.  Keep all follow-up visits as told by your health care provider. This is important. Contact a health care provider if:  Your symptoms do not get better after 1-2 days.  Your symptoms go away and then return. Get help right away if you have:  Severe pain in your back or your lower abdomen.  A fever.  Nausea or vomiting. Summary  A urinary tract infection (UTI) is an infection of any part of the urinary tract, which includes the kidneys, ureters, bladder, and urethra.  Most urinary tract infections are caused by bacteria in your genital area, around the entrance to your urinary tract (urethra).  Treatment for this condition often includes antibiotic medicines.  If you were prescribed an antibiotic medicine, take it as told by your health care provider. Do not stop using the antibiotic even if you start to feel better.  Keep all follow-up visits as told by your health care provider. This is important. This information is not intended to replace advice given to you by your health care provider. Make sure you discuss any questions you have with your health care provider. Document Revised: 04/05/2018 Document Reviewed: 10/26/2017 Elsevier Patient Education  [place] DASH stands for "Dietary Approaches to Stop Hypertension." The DASH eating plan is a healthy eating plan that has been shown to reduce high blood pressure (hypertension). It may also reduce your risk for type 2 diabetes, heart disease, and stroke. The DASH eating plan may also help with weight loss. What are tips for following this plan?  General guidelines  Avoid eating more than 2,300 mg (milligrams) of salt (sodium) a day. If you have hypertension, you may need to reduce your sodium intake to 1,500 mg a day.  Limit alcohol intake to no more than 1 drink a day for nonpregnant women and 2 drinks a day  for men. One drink equals 12 oz of beer, 5 oz of wine, or 1 oz of hard liquor.  Work with your health care provider to maintain a healthy body weight or to lose weight. Ask what an ideal weight is for you.  Get at least 30 minutes of exercise that causes your heart to beat faster (aerobic exercise) most days of the week. Activities may include walking, swimming, or biking.  Work with your health care provider or diet and nutrition specialist (dietitian) to adjust your eating plan to your individual calorie needs. Reading food labels   Check food labels for the amount of sodium per serving. Choose foods with less than 5 percent of the Daily Value of sodium. Generally, foods with less than 300 mg of sodium per serving fit into this eating plan.  To find whole grains, look for the word "whole" as the first word in the ingredient list. Shopping  Buy products labeled as "low-sodium" or "no salt added."  Buy fresh foods. Avoid canned foods and premade or frozen meals. Cooking  Avoid adding salt when cooking. Use salt-free seasonings or herbs instead of table salt or sea salt. Check with your health care provider  or pharmacist before using salt substitutes.  Do not fry foods. Cook foods using healthy methods such as baking, boiling, grilling, and broiling instead.  Cook with heart-healthy oils, such as olive, canola, soybean, or sunflower oil. Meal planning  Eat a balanced diet that includes: ? 5 or more servings of fruits and vegetables each day. At each meal, try to fill half of your plate with fruits and vegetables. ? Up to 6-8 servings of whole grains each day. ? Less than 6 oz of lean meat, poultry, or fish each day. A 3-oz serving of meat is about the same size as a deck of cards. One egg equals 1 oz. ? 2 servings of low-fat dairy each day. ? A serving of nuts, seeds, or beans 5 times each week. ? Heart-healthy fats. Healthy fats called Omega-3 fatty acids are found in foods such as  flaxseeds and coldwater fish, like sardines, salmon, and mackerel.  Limit how much you eat of the following: ? Canned or prepackaged foods. ? Food that is high in trans fat, such as fried foods. ? Food that is high in saturated fat, such as fatty meat. ? Sweets, desserts, sugary drinks, and other foods with added sugar. ? Full-fat dairy products.  Do not salt foods before eating.  Try to eat at least 2 vegetarian meals each week.  Eat more home-cooked food and less restaurant, buffet, and fast food.  When eating at a restaurant, ask that your food be prepared with less salt or no salt, if possible. What foods are recommended? The items listed may not be a complete list. Talk with your dietitian about what dietary choices are best for you. Grains Whole-grain or whole-wheat bread. Whole-grain or whole-wheat pasta. Brown rice. [place]. Bulgur. Whole-grain and low-sodium cereals. Pita bread. Low-fat, low-sodium crackers. Whole-wheat flour tortillas. Vegetables Fresh or frozen vegetables (raw, steamed, roasted, or grilled). Low-sodium or reduced-sodium tomato and vegetable juice. Low-sodium or reduced-sodium tomato sauce and tomato paste. Low-sodium or reduced-sodium canned vegetables. Fruits All fresh, dried, or frozen fruit. Canned fruit in natural juice (without added sugar). Meat and other protein foods Skinless chicken or [place]. Ground chicken or [place]. Pork with fat trimmed off. Fish and seafood. Egg whites. Dried beans, peas, or lentils. Unsalted nuts, nut butters, and seeds. Unsalted canned beans. Lean cuts of beef with fat trimmed off. Low-sodium, lean deli meat. Dairy Low-fat (1%) or fat-free (skim) milk. Fat-free, low-fat, or reduced-fat cheeses. Nonfat, low-sodium ricotta or cottage cheese. Low-fat or nonfat yogurt. Low-fat, low-sodium cheese. Fats and oils Soft margarine without trans fats. Vegetable oil. Low-fat, reduced-fat, or light mayonnaise and salad dressings  (reduced-sodium). Canola, safflower, olive, soybean, and sunflower oils. Avocado. Seasoning and other foods Herbs. Spices. Seasoning mixes without salt. Unsalted popcorn and pretzels. Fat-free sweets. What foods are not recommended? The items listed may not be a complete list. Talk with your dietitian about what dietary choices are best for you. Grains Baked goods made with fat, such as croissants, muffins, or some breads. Dry pasta or rice meal packs. Vegetables Creamed or fried vegetables. Vegetables in a cheese sauce. Regular canned vegetables (not low-sodium or reduced-sodium). Regular canned tomato sauce and paste (not low-sodium or reduced-sodium). Regular tomato and vegetable juice (not low-sodium or reduced-sodium). Angie Fava. Olives. Fruits Canned fruit in a light or heavy syrup. Fried fruit. Fruit in cream or butter sauce. Meat and other protein foods Fatty cuts of meat. Ribs. Fried meat. Berniece Salines. Sausage. Bologna and other processed lunch meats. Salami. Fatback. Hotdogs. Bratwurst.  Salted nuts and seeds. Canned beans with added salt. Canned or smoked fish. Whole eggs or egg yolks. Chicken or [place] with skin. Dairy Whole or 2% milk, cream, and half-and-half. Whole or full-fat cream cheese. Whole-fat or sweetened yogurt. Full-fat cheese. Nondairy creamers. Whipped toppings. Processed cheese and cheese spreads. Fats and oils Butter. Stick margarine. Lard. Shortening. Ghee. Bacon fat. Tropical oils, such as coconut, palm kernel, or palm oil. Seasoning and other foods Salted popcorn and pretzels. Onion salt, garlic salt, seasoned salt, table salt, and sea salt. Worcestershire sauce. Tartar sauce. Barbecue sauce. Teriyaki sauce. Soy sauce, including reduced-sodium. Steak sauce. Canned and packaged gravies. Fish sauce. Oyster sauce. Cocktail sauce. Horseradish that you find on the shelf. Ketchup. Mustard. Meat flavorings and tenderizers. Bouillon cubes. Hot sauce and Tabasco sauce. Premade or  packaged marinades. Premade or packaged taco seasonings. Relishes. Regular salad dressings. Where to find more information:  National Heart, Lung, and [place]: https://wilson-eaton.com/  American Heart Association: www.heart.org Summary  The DASH eating plan is a healthy eating plan that has been shown to reduce high blood pressure (hypertension). It may also reduce your risk for type 2 diabetes, heart disease, and stroke.  With the DASH eating plan, you should limit salt (sodium) intake to 2,300 mg a day. If you have hypertension, you may need to reduce your sodium intake to 1,500 mg a day.  When on the DASH eating plan, aim to eat more fresh fruits and vegetables, whole grains, lean proteins, low-fat dairy, and heart-healthy fats.  Work with your health care provider or diet and nutrition specialist (dietitian) to adjust your eating plan to your individual calorie needs. This information is not intended to replace advice given to you by your health care provider. Make sure you discuss any questions you have with your health care provider. Document Revised: 03/31/2017 Document Reviewed: 04/11/2016 Elsevier Patient Education  2020 [employer].

## 2019-10-10 LAB — URINALYSIS, ROUTINE W REFLEX MICROSCOPIC
Bacteria, UA: NONE SEEN /HPF
Bilirubin Urine: NEGATIVE
Glucose, UA: NEGATIVE
Hgb urine dipstick: NEGATIVE
Hyaline Cast: NONE SEEN /LPF
Ketones, ur: NEGATIVE
Nitrite: NEGATIVE
Protein, ur: NEGATIVE
RBC / HPF: NONE SEEN /HPF (ref 0–2)
Specific Gravity, Urine: 1.011 (ref 1.001–1.03)
Squamous Epithelial / HPF: NONE SEEN /HPF (ref <=5)
pH: 6.5 (ref 5.0–8.0)

## 2019-10-10 LAB — URINE CULTURE
MICRO NUMBER:: 10566130
Result:: NO GROWTH
SPECIMEN QUALITY:: ADEQUATE

## 2019-11-25 ENCOUNTER — Other Ambulatory Visit: Payer: Self-pay

## 2019-11-25 ENCOUNTER — Encounter: Payer: Self-pay | Admitting: Nurse Practitioner

## 2019-11-25 ENCOUNTER — Ambulatory Visit: Payer: BC Managed Care – PPO | Admitting: Nurse Practitioner

## 2019-11-25 ENCOUNTER — Telehealth: Payer: Self-pay | Admitting: Internal Medicine

## 2019-11-25 VITALS — BP 108/80 | HR 62 | Temp 97.9°F | Ht 64.02 in | Wt 140.0 lb

## 2019-11-25 DIAGNOSIS — R300 Dysuria: Secondary | ICD-10-CM | POA: Diagnosis not present

## 2019-11-25 LAB — POCT URINALYSIS DIPSTICK
Bilirubin, UA: NEGATIVE
Glucose, UA: NEGATIVE
Ketones, UA: NEGATIVE
Nitrite, UA: POSITIVE
Protein, UA: NEGATIVE
Spec Grav, UA: 1.01 (ref 1.010–1.025)
Urobilinogen, UA: 1 E.U./dL
pH, UA: 7 (ref 5.0–8.0)

## 2019-11-25 MED ORDER — SULFAMETHOXAZOLE-TRIMETHOPRIM 800-160 MG PO TABS: 1.0000 | ORAL_TABLET | Freq: Two times a day (BID) | ORAL | 0 refills | Status: DC

## 2019-11-25 NOTE — Progress Notes (Signed)
Established Patient Office Visit  Subjective:  Patient ID: Erika Daugherty, female    DOB: Feb 14, 1966  Age: 54 y.o. MRN: [id number]  CC:  Chief Complaint  Patient presents with  . Acute Visit    UTI   HPI Erika Daugherty is a 54 yo who presents for dysuria and urinary pressure that started yesterday morning. She has a strong urge to  void with little output.   She took Azo on the way to work and it helped the burning, but the pressure is still there. No flank pain, fevers, chills, nausea or vomiting. She takes a low-dose nitrofurantoin after sexual activity provided by gynecology. She did have a 5-day course of Macrobid last month, but no growth on the urine culture. However, her urine her dysuria resolved with the antibiotics.LMP was 2 years ago and her husband had vasectomy. So she denies risk of pregnancy. She has no vaginal discharge or complaints.   Past Medical History:  Diagnosis Date  . Allergy   . GERD (gastroesophageal reflux disease)   . Heart murmur     Past Surgical History:  Procedure Laterality Date  . VAGINAL DELIVERY     2  . WISDOM TOOTH EXTRACTION      Family History  Problem Relation Age of Onset  . Hypertension Mother   . Hyperlipidemia Mother        triglycerides  . Cancer Father        bile duct  . Heart disease Father 28    Social History   Socioeconomic History  . Marital status: Married    Spouse name: Not on file  . Number of children: Not on file  . Years of education: Not on file  . Highest education level: Not on file  Occupational History  . Not on file  Tobacco Use  . Smoking status: Never Smoker  . Smokeless tobacco: Never Used  Vaping Use  . Vaping Use: Never used  Substance and Sexual Activity  . Alcohol use: No    Comment: rare  . Drug use: No  . Sexual activity: Not on file  Other Topics Concern  . Not on file  Social History Narrative   Lives in [place], [place] with husband and two children (16YO, 11YO) and dog.        Work - Arboriculturist   Diet - relative healthy   Exercise - walk or elliptical 53min 5 days per week   Social Determinants of Health   Financial Resource Strain:   . Difficulty of Paying Living Expenses:   Food Insecurity:   . Worried About Charity fundraiser in the Last Year:   . Arboriculturist in the Last Year:   Transportation Needs:   . Film/video editor (Medical):   Marland Kitchen Lack of Transportation (Non-Medical):   Physical Activity:   . Days of Exercise per Week:   . Minutes of Exercise per Session:   Stress:   . Feeling of Stress :   Social Connections:   . Frequency of Communication with Friends and Family:   . Frequency of Social Gatherings with Friends and Family:   . Attends Religious Services:   . Active Member of Clubs or Organizations:   . Attends Archivist Meetings:   Marland Kitchen Marital Status:   Intimate Partner Violence:   . Fear of Current or Ex-Partner:   . Emotionally Abused:   Marland Kitchen Physically Abused:   . Sexually Abused:  Outpatient Medications Prior to Visit  Medication Sig Dispense Refill  . amLODipine (NORVASC) 5 MG tablet TAKE 1 TABLET BY MOUTH DAILY 90 tablet 1  . metoprolol succinate (TOPROL-XL) 25 MG 24 hr tablet TAKE 1 TABLET BY MOUTH EVERY DAY 90 tablet 3  . nitrofurantoin, macrocrystal-monohydrate, (MACROBID) 100 MG capsule Take 1 capsule (100 mg total) by mouth 2 (two) times daily. With food 10 capsule 0   No facility-administered medications prior to visit.    Allergies  Allergen Reactions  . Erythromycin Diarrhea  . Lisinopril Swelling  . Prednisone Palpitations  . Strawberry Extract Rash     Review of Systems Pertinent positives noted in history of present illness otherwise negative.   Objective:    Physical Exam Vitals reviewed.  Constitutional:      Appearance: Normal appearance.  HENT:     Head: Normocephalic.  Cardiovascular:     Rate and Rhythm: Normal rate and regular rhythm.     Pulses: Normal pulses.      Heart sounds: Normal heart sounds.  Pulmonary:     Effort: Pulmonary effort is normal.     Breath sounds: Normal breath sounds.  Abdominal:     Palpations: Abdomen is soft.     Tenderness: There is no abdominal tenderness.  Skin:    General: Skin is warm and dry.  Neurological:     General: No focal deficit present.     Mental Status: She is alert and oriented to person, place, and time.    BP 108/80 (BP Location: Left Arm, Patient Position: Sitting, Cuff Size: Normal)   Pulse 62   Temp 97.9 F (36.6 C) (Oral)   Ht 5' 4.02" (1.626 m)   Wt 140 lb (63.5 kg)   SpO2 97%   BMI 24.02 kg/m  Wt Readings from Last 3 Encounters:  11/25/19 140 lb (63.5 kg)  10/08/19 140 lb (63.5 kg)  08/15/19 139 lb 3.2 oz (63.1 kg)     Health Maintenance Due  Topic Date Due  . Hepatitis C Screening  Never done  . HIV Screening  Never done  . PAP SMEAR-Modifier  07/09/2017  . COLONOSCOPY  Never done    There are no preventive care reminders to display for this patient.  Lab Results  Component Value Date   TSH 3.88 02/12/2019   Lab Results  Component Value Date   WBC 4.1 02/12/2019   HGB 13.5 02/12/2019   HCT 39.6 02/12/2019   MCV 91.2 02/12/2019   PLT 212.0 02/12/2019   Lab Results  Component Value Date   NA 139 08/15/2019   K 3.6 08/15/2019   CO2 29 08/15/2019   GLUCOSE 112 (H) 08/15/2019   BUN 15 08/15/2019   CREATININE 0.78 08/15/2019   BILITOT 0.8 02/12/2019   ALKPHOS 95 02/12/2019   AST 22 02/12/2019   ALT 22 02/12/2019   PROT 7.9 02/12/2019   ALBUMIN 4.2 02/12/2019   CALCIUM 9.4 08/15/2019   GFR 77.53 08/15/2019   Lab Results  Component Value Date   CHOL 139 02/12/2019   Lab Results  Component Value Date   HDL 52.50 02/12/2019   Lab Results  Component Value Date   LDLCALC 68 02/12/2019   Lab Results  Component Value Date   TRIG 89.0 02/12/2019   Lab Results  Component Value Date   CHOLHDL 3 02/12/2019   Lab Results  Component Value Date   HGBA1C  5.3 12/14/2015      Assessment & Plan:   Problem  List Items Addressed This Visit      Other   Dysuria - Primary    I have ordered a urine culture- and will call with results.  I have ordered Bactrim for a 3 day treatment course as your urine test is very positive for infection. You  do not have a sulfa allergy listed, but please monitor for a rash and stop the medication and call back if you develop a rash allergy. I you develop more serious side effects, such as swelling, or trouble breathing, seek emergency help.  See further alarm symptoms below.   Drink plenty of fluids, meds, carbonated beverages, tea, coffee.        Relevant Orders   Urine Microscopic Only (Completed)   Urine Culture (Completed)   POCT Urinalysis Dipstick (Completed)      Meds ordered this encounter  Medications  . sulfamethoxazole-trimethoprim (BACTRIM DS) 800-160 MG tablet    Sig: Take 1 tablet by mouth 2 (two) times daily.    Dispense:  6 tablet    Refill:  0    Order Specific Question:   Supervising Provider    Answer:   Einar Pheasant [[id number]]    Follow-up: Return if symptoms worsen or fail to improve.   This visit occurred during the SARS-CoV-2 public health emergency.  Safety protocols were in place, including screening questions prior to the visit, additional usage of staff PPE, and extensive cleaning of exam room while observing appropriate contact time as indicated for disinfecting solutions.   Denice Paradise, NP

## 2019-11-25 NOTE — Patient Instructions (Addendum)
I have ordered a urine culture- and will call with results.  I have ordered Bactrim for a 3 day treatment course as your urine test is very positive for infection. You  do not have a sulfa allergy listed, but please monitor for a rash and stop the medication and call back if you develop a rash allergy. I you develop more serious side effects, such as swelling, or trouble breathing, seek emergency help.  See further alarm symptoms below.   Drink plenty of fluids, meds, carbonated beverages, tea, coffee.    Dysuria Dysuria is pain or discomfort while urinating. The pain or discomfort may be felt in the part of your body that drains urine from the bladder (urethra) or in the surrounding tissue of the genitals. The pain may also be felt in the groin area, lower abdomen, or lower back. You may have to urinate frequently or have the sudden feeling that you have to urinate (urgency). Dysuria can affect both men and women, but it is more common in women. Dysuria can be caused by many different things, including:  Urinary tract infection.  Kidney stones or bladder stones.  Certain sexually transmitted infections (STIs), such as chlamydia.  Dehydration.  Inflammation of the tissues of the vagina.  Use of certain medicines.  Use of certain soaps or scented products that cause irritation. Follow these instructions at home: General instructions  Watch your condition for any changes.  Urinate often. Avoid holding urine for long periods of time.  After a bowel movement or urination, women should cleanse from front to back, using each tissue only once.  Urinate after sexual intercourse.  Keep all follow-up visits as told by your health care provider. This is important.  If you had any tests done to find the cause of dysuria, it is up to you to get your test results. Ask your health care provider, or the department that is doing the test, when your results will be ready. Eating and  drinking   Drink enough fluid to keep your urine pale yellow.  Avoid caffeine, tea, and alcohol. They can irritate the bladder and make dysuria worse. In men, alcohol may irritate the prostate. Medicines  Take over-the-counter and prescription medicines only as told by your health care provider.  If you were prescribed an antibiotic medicine, take it as told by your health care provider. Do not stop taking the antibiotic even if you start to feel better. Contact a health care provider if:  You have a fever.  You develop pain in your back or sides.  You have nausea or vomiting.  You have blood in your urine.  You are not urinating as often as you usually do. Get help right away if:  Your pain is severe and not relieved with medicines.  You cannot eat or drink without vomiting.  You are confused.  You have a rapid heartbeat while at rest.  You have shaking or chills.  You feel extremely weak. Summary  Dysuria is pain or discomfort while urinating. Many different conditions can lead to dysuria.  If you have dysuria, you may have to urinate frequently or have the sudden feeling that you have to urinate (urgency).  Watch your condition for any changes. Keep all follow-up visits as told by your health care provider.  Make sure that you urinate often and drink enough fluid to keep your urine pale yellow. This information is not intended to replace advice given to you by your health care provider. Make  sure you discuss any questions you have with your health care provider. Document Revised: 03/31/2017 Document Reviewed: 02/02/2017 Elsevier Patient Education  [place].

## 2019-11-26 LAB — URINALYSIS, MICROSCOPIC ONLY

## 2019-11-27 ENCOUNTER — Encounter: Payer: Self-pay | Admitting: Nurse Practitioner

## 2019-11-27 NOTE — Assessment & Plan Note (Signed)
I have ordered a urine culture- and will call with results.  I have ordered Bactrim for a 3 day treatment course as your urine test is very positive for infection. You  do not have a sulfa allergy listed, but please monitor for a rash and stop the medication and call back if you develop a rash allergy. I you develop more serious side effects, such as swelling, or trouble breathing, seek emergency help.  See further alarm symptoms below.   Drink plenty of fluids, meds, carbonated beverages, tea, coffee.

## 2019-11-28 ENCOUNTER — Telehealth: Payer: Self-pay | Admitting: Nurse Practitioner

## 2019-11-28 LAB — URINE CULTURE
MICRO NUMBER:: 10749009
SPECIMEN QUALITY:: ADEQUATE

## 2019-11-28 NOTE — Telephone Encounter (Signed)
Spoke with patient and she is okay with her current course right now; took her last pill this morning. She is feeling fine and I advised if sx return to call the office back.

## 2019-11-28 NOTE — Telephone Encounter (Signed)
I LMOM  for her to call me. She was treated with appropriate antibiotic for UTI and newer guidelines are recommending shorter duration as long as her sx are gone.  PLAN: Please advise and inquire if her UTI sx are resolved after 3 day course of Bactrim  DS .

## 2019-12-12 ENCOUNTER — Encounter: Payer: Self-pay | Admitting: Internal Medicine

## 2019-12-26 ENCOUNTER — Encounter: Payer: Self-pay | Admitting: Internal Medicine

## 2020-02-05 LAB — HM MAMMOGRAPHY

## 2020-02-17 ENCOUNTER — Other Ambulatory Visit: Payer: Self-pay

## 2020-02-17 ENCOUNTER — Ambulatory Visit (INDEPENDENT_AMBULATORY_CARE_PROVIDER_SITE_OTHER): Payer: BC Managed Care – PPO | Admitting: Internal Medicine

## 2020-02-17 ENCOUNTER — Encounter: Payer: Self-pay | Admitting: Internal Medicine

## 2020-02-17 VITALS — BP 142/88 | HR 76 | Temp 97.9°F | Resp 16 | Ht 64.0 in | Wt 140.2 lb

## 2020-02-17 DIAGNOSIS — Z0000 Encounter for general adult medical examination without abnormal findings: Secondary | ICD-10-CM

## 2020-02-17 DIAGNOSIS — Z13220 Encounter for screening for lipoid disorders: Secondary | ICD-10-CM | POA: Diagnosis not present

## 2020-02-17 DIAGNOSIS — I10 Essential (primary) hypertension: Secondary | ICD-10-CM

## 2020-02-17 DIAGNOSIS — F439 Reaction to severe stress, unspecified: Secondary | ICD-10-CM

## 2020-02-17 DIAGNOSIS — Z1211 Encounter for screening for malignant neoplasm of colon: Secondary | ICD-10-CM

## 2020-02-17 LAB — COMPREHENSIVE METABOLIC PANEL
ALT: 19 U/L (ref 0–35)
AST: 21 U/L (ref 0–37)
Albumin: 4.3 g/dL (ref 3.5–5.2)
Alkaline Phosphatase: 97 U/L (ref 39–117)
BUN: 15 mg/dL (ref 6–23)
CO2: 28 mEq/L (ref 19–32)
Calcium: 9.1 mg/dL (ref 8.4–10.5)
Chloride: 104 mEq/L (ref 96–112)
Creatinine, Ser: 0.79 mg/dL (ref 0.40–1.20)
GFR: 85.7 mL/min (ref >60.00)
Glucose, Bld: 85 mg/dL (ref 70–99)
Potassium: 3.5 mEq/L (ref 3.5–5.1)
Sodium: 141 mEq/L (ref 135–145)
Total Bilirubin: 0.7 mg/dL (ref 0.2–1.2)
Total Protein: 8.3 g/dL (ref 6.0–8.3)

## 2020-02-17 LAB — LIPID PANEL
Cholesterol: 149 mg/dL (ref 0–200)
HDL: 51.4 mg/dL (ref >39.00)
LDL Cholesterol: 82 mg/dL (ref 0–99)
NonHDL: 98.04
Total CHOL/HDL Ratio: 3
Triglycerides: 78 mg/dL (ref 0.0–149.0)
VLDL: 15.6 mg/dL (ref 0.0–40.0)

## 2020-02-17 LAB — CBC WITH DIFFERENTIAL/PLATELET
Basophils Absolute: 0 10*3/uL (ref 0.0–0.1)
Basophils Relative: 0.8 % (ref 0.0–3.0)
Eosinophils Absolute: 0.1 10*3/uL (ref 0.0–0.7)
Eosinophils Relative: 2.5 % (ref 0.0–5.0)
HCT: 40.2 % (ref 36.0–46.0)
Hemoglobin: 13.7 g/dL (ref 12.0–15.0)
Lymphocytes Relative: 36.3 % (ref 12.0–46.0)
Lymphs Abs: 1.6 10*3/uL (ref 0.7–4.0)
MCHC: 34.1 g/dL (ref 30.0–36.0)
MCV: 91.3 fl (ref 78.0–100.0)
Monocytes Absolute: 0.4 10*3/uL (ref 0.1–1.0)
Monocytes Relative: 10.1 % (ref 3.0–12.0)
Neutro Abs: 2.2 10*3/uL (ref 1.4–7.7)
Neutrophils Relative %: 50.3 % (ref 43.0–77.0)
Platelets: 227 10*3/uL (ref 150.0–400.0)
RBC: 4.4 Mil/uL (ref 3.87–5.11)
RDW: 12.5 % (ref 11.5–15.5)
WBC: 4.3 10*3/uL (ref 4.0–10.5)

## 2020-02-17 LAB — TSH: TSH: 2.5 u[IU]/mL (ref 0.35–4.50)

## 2020-02-17 NOTE — Progress Notes (Signed)
Patient ID: Erika Daugherty, female   DOB: Jul 01, 1965, 54 y.o.   MRN: [id number]   Subjective:    Patient ID: Erika Daugherty, female    DOB: 1966/05/02, 54 y.o.   MRN: [id number]  HPI This visit occurred during the SARS-CoV-2 public health emergency.  Safety protocols were in place, including screening questions prior to the visit, additional usage of staff PPE, and extensive cleaning of exam room while observing appropriate contact time as indicated for disinfecting solutions.  Patient here for her physical exam.  Sees gyn for breast, pelvic and pap smears.  She reports increased stress.  Mother had CVA in 08/2019.  Having to transition her to an assisted living facility.  Overall she feels she is handling things relatively well.  Tries to stay active.  No chest pain or sob reported.  Some drainage.  No nausea or vomiting.  Bowels moving.  No vaginal symptoms.  Had mammogram two weeks ago.    Past Medical History:  Diagnosis Date   Allergy    GERD (gastroesophageal reflux disease)    Heart murmur    Past Surgical History:  Procedure Laterality Date   VAGINAL DELIVERY     2   WISDOM TOOTH EXTRACTION     Family History  Problem Relation Age of Onset   Hypertension Mother    Hyperlipidemia Mother        triglycerides   Cancer Father        bile duct   Heart disease Father 24   Social History   Socioeconomic History   Marital status: Married    Spouse name: Not on file   Number of children: Not on file   Years of education: Not on file   Highest education level: Not on file  Occupational History   Not on file  Tobacco Use   Smoking status: Never Smoker   Smokeless tobacco: Never Used  Vaping Use   Vaping Use: Never used  Substance and Sexual Activity   Alcohol use: No    Comment: rare   Drug use: No   Sexual activity: Not on file  Other Topics Concern   Not on file  Social History Narrative   Lives in [place], [place] with husband and two children  (Ganado, 11YO) and dog.      Work - Arboriculturist   Diet - relative healthy   Exercise - walk or elliptical 4min 5 days per week   Social Determinants of Health   Financial Resource Strain:    Difficulty of Paying Living Expenses: Not on file  Food Insecurity:    Worried About Charity fundraiser in the Last Year: Not on file   [employer] of Food in the Last Year: Not on file  Transportation Needs:    Lack of Transportation (Medical): Not on file   Lack of Transportation (Non-Medical): Not on file  Physical Activity:    Days of Exercise per Week: Not on file   Minutes of Exercise per Session: Not on file  Stress:    Feeling of Stress : Not on file  Social Connections:    Frequency of Communication with Friends and Family: Not on file   Frequency of Social Gatherings with Friends and Family: Not on file   Attends Religious Services: Not on file   Active Member of Clubs or Organizations: Not on file   Attends Archivist Meetings: Not on file   Marital Status: Not on file  Outpatient Encounter Medications as of 02/17/2020  Medication Sig   amLODipine (NORVASC) 5 MG tablet TAKE 1 TABLET BY MOUTH DAILY   metoprolol succinate (TOPROL-XL) 25 MG 24 hr tablet TAKE 1 TABLET BY MOUTH EVERY DAY   [DISCONTINUED] sulfamethoxazole-trimethoprim (BACTRIM DS) 800-160 MG tablet Take 1 tablet by mouth 2 (two) times daily.   No facility-administered encounter medications on file as of 02/17/2020.    Review of Systems  Constitutional: Negative for appetite change and unexpected weight change.  HENT: Negative for congestion, sinus pressure and sore throat.   Eyes: Negative for pain and visual disturbance.  Respiratory: Negative for cough, chest tightness and shortness of breath.   Cardiovascular: Negative for chest pain, palpitations and leg swelling.  Gastrointestinal: Negative for abdominal pain, diarrhea, nausea and vomiting.  Genitourinary: Negative for  difficulty urinating and dysuria.  Musculoskeletal: Negative for back pain and joint swelling.  Skin: Negative for color change and rash.  Neurological: Negative for dizziness, light-headedness and headaches.  Hematological: Negative for adenopathy. Does not bruise/bleed easily.  Psychiatric/Behavioral: Negative for agitation and dysphoric mood.       Objective:    Physical Exam Vitals reviewed.  Constitutional:      General: She is not in acute distress.    Appearance: Normal appearance.  HENT:     Head: Normocephalic and atraumatic.     Right Ear: External ear normal.     Left Ear: External ear normal.  Eyes:     General: No scleral icterus.       Right eye: No discharge.        Left eye: No discharge.     Conjunctiva/sclera: Conjunctivae normal.  Neck:     Thyroid: No thyromegaly.  Cardiovascular:     Rate and Rhythm: Normal rate and regular rhythm.  Pulmonary:     Effort: No respiratory distress.     Breath sounds: Normal breath sounds. No wheezing.  Abdominal:     General: Bowel sounds are normal.     Palpations: Abdomen is soft.     Tenderness: There is no abdominal tenderness.  Genitourinary:    Comments: Performed by gyn.  Musculoskeletal:        General: No swelling or tenderness.     Cervical back: Neck supple. No tenderness.  Lymphadenopathy:     Cervical: No cervical adenopathy.  Skin:    Findings: No erythema or rash.  Neurological:     Mental Status: She is alert.  Psychiatric:        Mood and Affect: Mood normal.        Behavior: Behavior normal.     BP (!) 142/88    Pulse 76    Temp 97.9 F (36.6 C) (Oral)    Resp 16    Ht 5\' 4"  (1.626 m)    Wt 140 lb 3.2 oz (63.6 kg)    SpO2 99%    BMI 24.07 kg/m  Wt Readings from Last 3 Encounters:  02/17/20 140 lb 3.2 oz (63.6 kg)  11/25/19 140 lb (63.5 kg)  10/08/19 140 lb (63.5 kg)     Lab Results  Component Value Date   WBC 4.3 02/17/2020   HGB 13.7 02/17/2020   HCT 40.2 02/17/2020   PLT 227.0  02/17/2020   GLUCOSE 85 02/17/2020   CHOL 149 02/17/2020   TRIG 78.0 02/17/2020   HDL 51.40 02/17/2020   LDLCALC 82 02/17/2020   ALT 19 02/17/2020   AST 21 02/17/2020   NA 141 02/17/2020  K 3.5 02/17/2020   CL 104 02/17/2020   CREATININE 0.79 02/17/2020   BUN 15 02/17/2020   CO2 28 02/17/2020   TSH 2.50 02/17/2020   HGBA1C 5.3 12/14/2015   MICROALBUR 0.8 11/05/2013       Assessment & Plan:   Problem List Items Addressed This Visit    Stress    Has changed jobs as outlined in previous note.  Increased stress with her mother's health issues.  Discussed with her today.  Overall feeling like she is handling things well.  Follow.       Benign essential HTN    Elevated on check here.  Outside checks previous better controlled.  Have her spot check her pressure and send in readings.  Continue amlodipine and metoprolol.  Follow pressures.  Follow metabolic panel.       Relevant Orders   CBC with Differential/Platelet (Completed)   Comprehensive metabolic panel (Completed)   TSH (Completed)   Annual physical exam    Physical today - breasts, pap and pelvic exams through gyn.  Had mammogram two weeks ago.  Need results.  (had [facility]  Has appt with gyn next month.  Refer to GI for colonoscopy.        Other Visit Diagnoses    Routine general medical examination at a health care facility    -  Primary   Screening cholesterol level       Relevant Orders   Lipid panel (Completed)   Colon cancer screening       Relevant Orders   Ambulatory referral to Gastroenterology       Einar Pheasant, MD

## 2020-02-23 ENCOUNTER — Encounter: Payer: Self-pay | Admitting: Internal Medicine

## 2020-02-23 NOTE — Assessment & Plan Note (Signed)
Has changed jobs as outlined in previous note.  Increased stress with her mother's health issues.  Discussed with her today.  Overall feeling like she is handling things well.  Follow.

## 2020-02-23 NOTE — Assessment & Plan Note (Signed)
Physical today - breasts, pap and pelvic exams through gyn.  Had mammogram two weeks ago.  Need results.  (had [facility]).  Has appt with gyn next month.  Refer to GI for colonoscopy.

## 2020-02-23 NOTE — Assessment & Plan Note (Signed)
Elevated on check here.  Outside checks previous better controlled.  Have her spot check her pressure and send in readings.  Continue amlodipine and metoprolol.  Follow pressures.  Follow metabolic panel.

## 2020-03-06 ENCOUNTER — Encounter: Payer: Self-pay | Admitting: Internal Medicine

## 2020-03-07 ENCOUNTER — Other Ambulatory Visit: Payer: Self-pay | Admitting: Internal Medicine

## 2020-03-18 DIAGNOSIS — N390 Urinary tract infection, site not specified: Secondary | ICD-10-CM | POA: Insufficient documentation

## 2020-03-18 DIAGNOSIS — Z87440 Personal history of urinary (tract) infections: Secondary | ICD-10-CM | POA: Insufficient documentation

## 2020-03-19 ENCOUNTER — Encounter: Payer: Self-pay | Admitting: Internal Medicine

## 2020-05-08 ENCOUNTER — Other Ambulatory Visit: Payer: Self-pay | Admitting: Internal Medicine

## 2020-05-26 ENCOUNTER — Other Ambulatory Visit: Payer: Self-pay

## 2020-05-26 ENCOUNTER — Ambulatory Visit: Payer: BC Managed Care – PPO | Admitting: Internal Medicine

## 2020-05-26 DIAGNOSIS — I10 Essential (primary) hypertension: Secondary | ICD-10-CM

## 2020-05-26 DIAGNOSIS — F439 Reaction to severe stress, unspecified: Secondary | ICD-10-CM | POA: Diagnosis not present

## 2020-05-26 DIAGNOSIS — Z1211 Encounter for screening for malignant neoplasm of colon: Secondary | ICD-10-CM

## 2020-05-26 NOTE — Progress Notes (Signed)
Patient ID: Erika Daugherty, female   DOB: Jun 20, 1965, 55 y.o.   MRN: [id number]   Subjective:    Patient ID: Erika Daugherty, female    DOB: 06-07-1965, 55 y.o.   MRN: [id number]  HPI This visit occurred during the SARS-CoV-2 public health emergency.  Safety protocols were in place, including screening questions prior to the visit, additional usage of staff PPE, and extensive cleaning of exam room while observing appropriate contact time as indicated for disinfecting solutions.  Patient here for a scheduled follow up.  Here to follow up regarding her blood pressure.  Taking amlodipine and metoprolol.  Tries to stay active.  No chest pain or sob.  No acid reflux or abdominal pain reported.  Does report some increased gas - with meals.  Planning for colonoscopy 07/06/20.  Handling stress.    Past Medical History:  Diagnosis Date  . Allergy   . GERD (gastroesophageal reflux disease)   . Heart murmur    Past Surgical History:  Procedure Laterality Date  . VAGINAL DELIVERY     2  . WISDOM TOOTH EXTRACTION     Family History  Problem Relation Age of Onset  . Hypertension Mother   . Hyperlipidemia Mother        triglycerides  . Cancer Father        bile duct  . Heart disease Father 83   Social History   Socioeconomic History  . Marital status: Married    Spouse name: Not on file  . Number of children: Not on file  . Years of education: Not on file  . Highest education level: Not on file  Occupational History  . Not on file  Tobacco Use  . Smoking status: Never Smoker  . Smokeless tobacco: Never Used  Vaping Use  . Vaping Use: Never used  Substance and Sexual Activity  . Alcohol use: No    Comment: rare  . Drug use: No  . Sexual activity: Not on file  Other Topics Concern  . Not on file  Social History Narrative   Lives in [place], [place] with husband and two children (16YO, 11YO) and dog.      Work - Arboriculturist   Diet - relative healthy   Exercise - walk or  elliptical 84min 5 days per week   Social Determinants of Health   Financial Resource Strain: Not on file  Food Insecurity: Not on file  Transportation Needs: Not on file  Physical Activity: Not on file  Stress: Not on file  Social Connections: Not on file    Outpatient Encounter Medications as of 05/26/2020  Medication Sig  . amLODipine (NORVASC) 5 MG tablet TAKE 1 TABLET BY MOUTH DAILY  . metoprolol succinate (TOPROL-XL) 25 MG 24 hr tablet TAKE 1 TABLET BY MOUTH EVERY DAY   No facility-administered encounter medications on file as of 05/26/2020.    Review of Systems  Constitutional: Negative for appetite change and unexpected weight change.  HENT: Negative for congestion and sinus pressure.   Respiratory: Negative for cough, chest tightness and shortness of breath.   Cardiovascular: Negative for chest pain, palpitations and leg swelling.  Gastrointestinal: Negative for abdominal pain, diarrhea, nausea and vomiting.  Genitourinary: Negative for difficulty urinating and dysuria.  Musculoskeletal: Negative for joint swelling and myalgias.  Skin: Negative for color change and rash.  Neurological: Negative for dizziness, light-headedness and headaches.  Psychiatric/Behavioral: Negative for agitation and dysphoric mood.       Objective:  Physical Exam Vitals reviewed.  Constitutional:      General: She is not in acute distress.    Appearance: Normal appearance.  HENT:     Head: Normocephalic and atraumatic.     Right Ear: External ear normal.     Left Ear: External ear normal.     Mouth/Throat:     Mouth: Oropharynx is clear and moist.  Eyes:     General: No scleral icterus.       Right eye: No discharge.        Left eye: No discharge.     Conjunctiva/sclera: Conjunctivae normal.  Neck:     Thyroid: No thyromegaly.  Cardiovascular:     Rate and Rhythm: Normal rate and regular rhythm.  Pulmonary:     Effort: No respiratory distress.     Breath sounds: Normal  breath sounds. No wheezing.  Abdominal:     General: Bowel sounds are normal.     Palpations: Abdomen is soft.     Tenderness: There is no abdominal tenderness.  Musculoskeletal:        General: No swelling, tenderness or edema.     Cervical back: Neck supple. No tenderness.  Lymphadenopathy:     Cervical: No cervical adenopathy.  Skin:    Findings: No erythema or rash.  Neurological:     Mental Status: She is alert.  Psychiatric:        Mood and Affect: Mood normal.        Behavior: Behavior normal.     BP 130/86   Pulse 61   Temp 98.1 F (36.7 C) (Oral)   Resp 16   Ht 5\' 4"  (1.626 m)   Wt 145 lb (65.8 kg)   SpO2 99%   BMI 24.89 kg/m  Wt Readings from Last 3 Encounters:  05/26/20 145 lb (65.8 kg)  02/17/20 140 lb 3.2 oz (63.6 kg)  11/25/19 140 lb (63.5 kg)     Lab Results  Component Value Date   WBC 4.3 02/17/2020   HGB 13.7 02/17/2020   HCT 40.2 02/17/2020   PLT 227.0 02/17/2020   GLUCOSE 85 02/17/2020   CHOL 149 02/17/2020   TRIG 78.0 02/17/2020   HDL 51.40 02/17/2020   LDLCALC 82 02/17/2020   ALT 19 02/17/2020   AST 21 02/17/2020   NA 141 02/17/2020   K 3.5 02/17/2020   CL 104 02/17/2020   CREATININE 0.79 02/17/2020   BUN 15 02/17/2020   CO2 28 02/17/2020   TSH 2.50 02/17/2020   HGBA1C 5.3 12/14/2015   MICROALBUR 0.8 11/05/2013       Assessment & Plan:   Problem List Items Addressed This Visit    Benign essential HTN    Elevated today.  Discussed medication.  Discussed medication changes.  Wants to monitor her blood pressure.  Send in readings.  Schedule f/u to reassess.  Follow metabolic panel.       Colon cancer screening    Planning for colonoscopy (Dr Bary Castilla) - 07/06/20.        Stress    Discussed.  Appears to be handling things relatively well.  Notify me if feels needs any further intervention.           Einar Pheasant, MD

## 2020-05-31 ENCOUNTER — Encounter: Payer: Self-pay | Admitting: Internal Medicine

## 2020-05-31 DIAGNOSIS — Z1211 Encounter for screening for malignant neoplasm of colon: Secondary | ICD-10-CM | POA: Insufficient documentation

## 2020-05-31 NOTE — Assessment & Plan Note (Signed)
Discussed.  Appears to be handling things relatively well.  Notify me if feels needs any further intervention.

## 2020-05-31 NOTE — Assessment & Plan Note (Signed)
Planning for colonoscopy (Dr Bary Castilla) - 07/06/20.

## 2020-05-31 NOTE — Assessment & Plan Note (Signed)
Elevated today.  Discussed medication.  Discussed medication changes.  Wants to monitor her blood pressure.  Send in readings.  Schedule f/u to reassess.  Follow metabolic panel.

## 2020-07-21 ENCOUNTER — Encounter: Payer: Self-pay | Admitting: Internal Medicine

## 2020-07-21 NOTE — Telephone Encounter (Signed)
I am ok to schedule an appt to evaluate earlier

## 2020-07-21 NOTE — Telephone Encounter (Signed)
After discussing with patient, this knot has been here for a couple of weeks and has not changed. She has appt on 4/6. She is going to keep this appt unless something changes. If changes, she will let me know.

## 2020-08-05 ENCOUNTER — Ambulatory Visit: Payer: BC Managed Care – PPO | Admitting: Internal Medicine

## 2020-08-05 ENCOUNTER — Other Ambulatory Visit: Payer: Self-pay

## 2020-08-05 ENCOUNTER — Encounter: Payer: Self-pay | Admitting: Internal Medicine

## 2020-08-05 VITALS — BP 134/80 | HR 58 | Temp 98.1°F | Resp 16 | Ht 64.0 in | Wt 141.8 lb

## 2020-08-05 DIAGNOSIS — E0789 Other specified disorders of thyroid: Secondary | ICD-10-CM

## 2020-08-05 DIAGNOSIS — I10 Essential (primary) hypertension: Secondary | ICD-10-CM

## 2020-08-05 DIAGNOSIS — M7989 Other specified soft tissue disorders: Secondary | ICD-10-CM

## 2020-08-05 DIAGNOSIS — F439 Reaction to severe stress, unspecified: Secondary | ICD-10-CM | POA: Diagnosis not present

## 2020-08-05 DIAGNOSIS — E041 Nontoxic single thyroid nodule: Secondary | ICD-10-CM | POA: Insufficient documentation

## 2020-08-05 LAB — BASIC METABOLIC PANEL
BUN: 13 mg/dL (ref 6–23)
CO2: 31 mEq/L (ref 19–32)
Calcium: 9.5 mg/dL (ref 8.4–10.5)
Chloride: 103 mEq/L (ref 96–112)
Creatinine, Ser: 0.89 mg/dL (ref 0.40–1.20)
GFR: 74.2 mL/min (ref >60.00)
Glucose, Bld: 91 mg/dL (ref 70–99)
Potassium: 3.7 mEq/L (ref 3.5–5.1)
Sodium: 140 mEq/L (ref 135–145)

## 2020-08-05 LAB — T3, FREE: T3, Free: 3.7 pg/mL (ref 2.3–4.2)

## 2020-08-05 LAB — TSH: TSH: 5.23 u[IU]/mL — ABNORMAL HIGH (ref 0.35–4.50)

## 2020-08-05 LAB — T4, FREE: Free T4: 0.77 ng/dL (ref 0.60–1.60)

## 2020-08-05 NOTE — Assessment & Plan Note (Signed)
Soft tissue mass - left thigh. No pain.  Question of lipoma.  Will have surgery confirm no further w/up warranted.

## 2020-08-05 NOTE — Assessment & Plan Note (Signed)
Exercising.  Handling stress.  Follow.

## 2020-08-05 NOTE — Progress Notes (Signed)
Patient ID: Erika Daugherty, female   DOB: 04-04-1966, 55 y.o.   MRN: [id number]   Subjective:    Patient ID: Erika Daugherty, female    DOB: 16-Dec-1965, 55 y.o.   MRN: [id number]  HPI This visit occurred during the SARS-CoV-2 public health emergency.  Safety protocols were in place, including screening questions prior to the visit, additional usage of staff PPE, and extensive cleaning of exam room while observing appropriate contact time as indicated for disinfecting solutions.  Patient here for a scheduled follow up. Is concerned regarding a knot on her left thigh.  No redness. No pain.  Noticed 4 weeks ago.  Was rubbing her left thigh and noticed.  Has started exercising.  No chest pain or sob reported.  Eating.  No abdominal pain or cramping.  Started align.  Gas is better.  Bowels moving.  No problems swallowing.     Past Medical History:  Diagnosis Date  . Allergy   . GERD (gastroesophageal reflux disease)   . Heart murmur    Past Surgical History:  Procedure Laterality Date  . VAGINAL DELIVERY     2  . WISDOM TOOTH EXTRACTION     Family History  Problem Relation Age of Onset  . Hypertension Mother   . Hyperlipidemia Mother        triglycerides  . Cancer Father        bile duct  . Heart disease Father 75   Social History   Socioeconomic History  . Marital status: Married    Spouse name: Not on file  . Number of children: Not on file  . Years of education: Not on file  . Highest education level: Not on file  Occupational History  . Not on file  Tobacco Use  . Smoking status: Never Smoker  . Smokeless tobacco: Never Used  Vaping Use  . Vaping Use: Never used  Substance and Sexual Activity  . Alcohol use: No    Comment: rare  . Drug use: No  . Sexual activity: Not on file  Other Topics Concern  . Not on file  Social History Narrative   Lives in [place], [place] with husband and two children (16YO, 11YO) and dog.      Work - Arboriculturist   Diet - relative  healthy   Exercise - walk or elliptical 44min 5 days per week   Social Determinants of Health   Financial Resource Strain: Not on file  Food Insecurity: Not on file  Transportation Needs: Not on file  Physical Activity: Not on file  Stress: Not on file  Social Connections: Not on file    Outpatient Encounter Medications as of 08/05/2020  Medication Sig  . amLODipine (NORVASC) 5 MG tablet TAKE 1 TABLET BY MOUTH DAILY  . metoprolol succinate (TOPROL-XL) 25 MG 24 hr tablet TAKE 1 TABLET BY MOUTH EVERY DAY   No facility-administered encounter medications on file as of 08/05/2020.    Review of Systems  Constitutional: Negative for appetite change and unexpected weight change.  HENT: Negative for congestion and sinus pain.   Respiratory: Negative for cough, chest tightness and shortness of breath.   Cardiovascular: Negative for chest pain, palpitations and leg swelling.  Gastrointestinal: Negative for abdominal pain, diarrhea, nausea and vomiting.  Genitourinary: Negative for difficulty urinating and dysuria.  Musculoskeletal: Negative for joint swelling and myalgias.  Skin: Negative for color change and rash.       Knot left thigh.   Neurological: Negative for  dizziness, light-headedness and headaches.  Psychiatric/Behavioral: Negative for agitation and dysphoric mood.       Objective:    Physical Exam Vitals reviewed.  Constitutional:      General: She is not in acute distress.    Appearance: Normal appearance.  HENT:     Head: Normocephalic and atraumatic.     Right Ear: External ear normal.     Left Ear: External ear normal.  Eyes:     General: No scleral icterus.       Right eye: No discharge.        Left eye: No discharge.     Conjunctiva/sclera: Conjunctivae normal.  Neck:     Thyroid: No thyromegaly.     Comments: Fullness - thyroid (left). Non tender.  Cardiovascular:     Rate and Rhythm: Normal rate and regular rhythm.  Pulmonary:     Effort: No respiratory  distress.     Breath sounds: Normal breath sounds. No wheezing.  Abdominal:     General: Bowel sounds are normal.     Palpations: Abdomen is soft.     Tenderness: There is no abdominal tenderness.  Musculoskeletal:        General: No swelling or tenderness.     Cervical back: Neck supple. No tenderness.  Lymphadenopathy:     Cervical: No cervical adenopathy.  Skin:    Findings: No erythema or rash.     Comments: Palpable soft tissue fullness - left ant/med thigh. Non tender. No redness.  No increased warmth. No swelling.   Neurological:     Mental Status: She is alert.  Psychiatric:        Mood and Affect: Mood normal.        Behavior: Behavior normal.     BP 134/80   Pulse (!) 58   Temp 98.1 F (36.7 C) (Oral)   Resp 16   Ht 5\' 4"  (1.626 m)   Wt 141 lb 12.8 oz (64.3 kg)   SpO2 99%   BMI 24.34 kg/m  Wt Readings from Last 3 Encounters:  08/05/20 141 lb 12.8 oz (64.3 kg)  05/26/20 145 lb (65.8 kg)  02/17/20 140 lb 3.2 oz (63.6 kg)     Lab Results  Component Value Date   WBC 4.3 02/17/2020   HGB 13.7 02/17/2020   HCT 40.2 02/17/2020   PLT 227.0 02/17/2020   GLUCOSE 85 02/17/2020   CHOL 149 02/17/2020   TRIG 78.0 02/17/2020   HDL 51.40 02/17/2020   LDLCALC 82 02/17/2020   ALT 19 02/17/2020   AST 21 02/17/2020   NA 141 02/17/2020   K 3.5 02/17/2020   CL 104 02/17/2020   CREATININE 0.79 02/17/2020   BUN 15 02/17/2020   CO2 28 02/17/2020   TSH 2.50 02/17/2020   HGBA1C 5.3 12/14/2015   MICROALBUR 0.8 11/05/2013       Assessment & Plan:   Problem List Items Addressed This Visit    Benign essential HTN - Primary    Blood pressure as outlined.  Continue amlodipine and metoprolol.  Discussed blood pressure guidelines.  Desires not to change medication.  Continue follow pressure.  Follow metabolic panel.       Relevant Orders   Basic metabolic panel   Soft tissue mass    Soft tissue mass - left thigh. No pain.  Question of lipoma.  Will have surgery  confirm no further w/up warranted.        Relevant Orders   Ambulatory referral to  General Surgery   Stress    Exercising.  Handling stress.  Follow.       Thyroid fullness    Thyroid fullness noted on exam.  Check TFTs.  Check thyroid ultrasound.        Relevant Orders   TSH   T4, free   T3, free   US THYROID       Einar Pheasant, MD

## 2020-08-05 NOTE — Assessment & Plan Note (Signed)
Blood pressure as outlined.  Continue amlodipine and metoprolol.  Discussed blood pressure guidelines.  Desires not to change medication.  Continue follow pressure.  Follow metabolic panel.

## 2020-08-05 NOTE — Assessment & Plan Note (Signed)
Thyroid fullness noted on exam.  Check TFTs.  Check thyroid ultrasound.

## 2020-08-06 ENCOUNTER — Other Ambulatory Visit: Payer: Self-pay | Admitting: Internal Medicine

## 2020-08-06 DIAGNOSIS — R7989 Other specified abnormal findings of blood chemistry: Secondary | ICD-10-CM

## 2020-08-06 NOTE — Telephone Encounter (Signed)
See result note.  

## 2020-08-06 NOTE — Telephone Encounter (Signed)
See result note.  Just need to recheck tsh and I am ok with ultrasound being scheduled this month.

## 2020-08-06 NOTE — Progress Notes (Signed)
Order placed for f/u labs.  

## 2020-08-18 ENCOUNTER — Other Ambulatory Visit: Payer: Self-pay

## 2020-08-18 ENCOUNTER — Ambulatory Visit
Admission: EM | Admit: 2020-08-18 | Discharge: 2020-08-18 | Disposition: A | Discharge status: Home / Self Care | Payer: BC Managed Care – PPO | Attending: Emergency Medicine | Admitting: Emergency Medicine

## 2020-08-18 ENCOUNTER — Encounter: Payer: Self-pay | Admitting: Emergency Medicine

## 2020-08-18 DIAGNOSIS — S41152A Open bite of left upper arm, initial encounter: Secondary | ICD-10-CM

## 2020-08-18 DIAGNOSIS — L03114 Cellulitis of left upper limb: Secondary | ICD-10-CM

## 2020-08-18 DIAGNOSIS — Z23 Encounter for immunization: Secondary | ICD-10-CM

## 2020-08-18 DIAGNOSIS — S41132A Puncture wound without foreign body of left upper arm, initial encounter: Secondary | ICD-10-CM

## 2020-08-18 DIAGNOSIS — W5501XA Bitten by cat, initial encounter: Secondary | ICD-10-CM

## 2020-08-18 MED ORDER — AMOXICILLIN-POT CLAVULANATE 875-125 MG PO TABS: 1.0000 | ORAL_TABLET | Freq: Two times a day (BID) | ORAL | 0 refills | Status: DC

## 2020-08-18 MED ORDER — TETANUS-DIPHTH-ACELL PERTUSSIS 5-2.5-18.5 LF-MCG/0.5 IM SUSY
0.5000 mL | PREFILLED_SYRINGE | Freq: Once | INTRAMUSCULAR | Status: AC
  Administered 2020-08-18: 0.5 mL via INTRAMUSCULAR

## 2020-08-18 NOTE — Discharge Instructions (Signed)
Take the Augmentin as directed.  Your tetanus was updated today.    Keep your wound clean and dry.  Wash it gently twice a day with soap and water.  Apply an antibiotic cream twice a day.    Follow-up with your primary care provider or return here if you see signs of infection, such as increased pain, redness, pus-like drainage, warmth, fever, chills, or other concerning symptoms.

## 2020-08-18 NOTE — ED Triage Notes (Signed)
Patient c/o cat bite that occurred two days ago.   Patient endorses bite on LFT upper arm.   Patient endorses redness and swelling.   Patient endorses 4 puncture wounds.   Patient states the cat is up to date with immunizations (rabies).   Patient's last tetanus was in 2013 per medical record.   Patient washed site with soap/water and used ointment on the site.

## 2020-08-18 NOTE — ED Provider Notes (Signed)
Erika Daugherty    CSN: [id number] Arrival date & time: 08/18/20  1158      History   Chief Complaint Chief Complaint  Patient presents with  . Animal Bite    HPI Erika Daugherty is a 55 y.o. female.   Patient presents with cat bite on her left upper arm which occurred 2 days ago.  The cat is hers and she reports it is up-to-date on vaccinations including rabies.  Patient states the bites are painful and she believes they are getting infected.  She denies fever, drainage, numbness, weakness, paresthesias, or other symptoms.  Treatment attempted at home with OTC antibacterial ointment.  Her medical history includes hypertension, heart murmur, GERD, allergies.  The history is provided by the patient and medical records.    Past Medical History:  Diagnosis Date  . Allergy   . GERD (gastroesophageal reflux disease)   . Heart murmur     Patient Active Problem List   Diagnosis Date Noted  . Thyroid fullness 08/05/2020  . Colon cancer screening 05/31/2020  . Dysuria 11/25/2019  . Congestion of nasal sinus 03/10/2019  . Soft tissue mass 01/01/2018  . Head pain 01/01/2018  . Muscle ache 12/27/2017  . Stress 08/18/2017  . Angioedema 12/28/2016  . Annual physical exam 12/16/2016  . Benign essential HTN 06/17/2015  . 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 Palpitations 03/05/2012  . Fatigue 01/30/2012    Past Surgical History:  Procedure Laterality Date  . VAGINAL DELIVERY     2  . WISDOM TOOTH EXTRACTION      OB History   No obstetric history on file.      Home Medications    Prior to Admission medications   Medication Sig Start Date End Date Taking? Authorizing Provider  amLODipine (NORVASC) 5 MG tablet TAKE 1 TABLET BY MOUTH DAILY 03/09/20  Yes Einar Pheasant, MD  amoxicillin-clavulanate (AUGMENTIN) 875-125 MG tablet Take 1 tablet by mouth every 12 (twelve) hours. 08/18/20  Yes Sharion Balloon, NP  metoprolol succinate (TOPROL-XL) 25 MG 24 hr tablet TAKE 1 TABLET BY  MOUTH EVERY DAY 05/08/20  Yes Einar Pheasant, MD    Family History Family History  Problem Relation Age of Onset  . Hypertension Mother   . Hyperlipidemia Mother        triglycerides  . Cancer Father        bile duct  . Heart disease Father 67    Social History Social History   Tobacco Use  . Smoking status: Never Smoker  . Smokeless tobacco: Never Used  Vaping Use  . Vaping Use: Never used  Substance Use Topics  . Alcohol use: No    Comment: rare  . Drug use: No     Allergies   Erythromycin, Lisinopril, Prednisone, and Strawberry extract   Review of Systems Review of Systems  Constitutional: Negative for chills and fever.  HENT: Negative for ear pain and sore throat.   Eyes: Negative for pain and visual disturbance.  Respiratory: Negative for cough and shortness of breath.   Cardiovascular: Negative for chest pain and palpitations.  Gastrointestinal: Negative for abdominal pain and vomiting.  Genitourinary: Negative for dysuria and hematuria.  Musculoskeletal: Negative for arthralgias and back pain.  Skin: Positive for wound. Negative for color change.  Neurological: Negative for syncope, weakness and numbness.  All other systems reviewed and are negative.    Physical Exam Triage Vital Signs ED Triage Vitals  Enc Vitals Group     BP  Pulse      Resp      Temp      Temp src      SpO2      Weight      Height      Head Circumference      Peak Flow      Pain Score      Pain Loc      Pain Edu?      Excl. in [place]?    No data found.  Updated Vital Signs BP (!) 162/84 (BP Location: Right Arm)   Pulse 72   Temp 98.6 F (37 C) (Oral)   Resp 16   LMP 01/18/2018   SpO2 98%   Visual Acuity Right Eye Distance:   Left Eye Distance:   Bilateral Distance:    Right Eye Near:   Left Eye Near:    Bilateral Near:     Physical Exam Vitals and nursing note reviewed.  Constitutional:      General: She is not in acute distress.    Appearance: She  is well-developed. She is not ill-appearing.  HENT:     Head: Normocephalic and atraumatic.     Mouth/Throat:     Mouth: Mucous membranes are moist.  Eyes:     Conjunctiva/sclera: Conjunctivae normal.  Cardiovascular:     Rate and Rhythm: Normal rate and regular rhythm.     Heart sounds: Normal heart sounds.  Pulmonary:     Effort: Pulmonary effort is normal. No respiratory distress.     Breath sounds: Normal breath sounds.  Abdominal:     Palpations: Abdomen is soft.     Tenderness: There is no abdominal tenderness.  Musculoskeletal:        General: Normal range of motion.     Cervical back: Neck supple.  Skin:    General: Skin is warm and dry.     Capillary Refill: Capillary refill takes less than 2 seconds.     Findings: Lesion present.     Comments: 4 puncture wounds on left upper arm with surrounding erythema and bruising.  Erythema 6 x 2.5 cm and 7.5 x 8 cm.  See pictures for details.  Neurological:     General: No focal deficit present.     Mental Status: She is alert and oriented to person, place, and time.     Sensory: No sensory deficit.     Motor: No weakness.     Gait: Gait normal.  Psychiatric:        Mood and Affect: Mood normal.        Behavior: Behavior normal.              UC Treatments / Results  Labs (all labs ordered are listed, but only abnormal results are displayed) Labs Reviewed - No data to display  EKG   Radiology No results found.  Procedures Procedures (including critical care time)  Medications Ordered in UC Medications  Tdap (BOOSTRIX) injection 0.5 mL (0.5 mLs Intramuscular Given 08/18/20 1220)    Initial Impression / Assessment and Plan / UC Course  I have reviewed the triage vital signs and the nursing notes.  Pertinent labs & imaging results that were available during my care of the patient were reviewed by me and considered in my medical decision making (see chart for details).   Puncture wounds and cellulitis due  to cat bite of left upper arm.  The cat that bit the patient is her own cat and  she reports he is up-to-date on his vaccinations including rabies.  Treating with Augmentin.  Tetanus updated today.  Wound care instructions and signs of worsening infection discussed with patient.  Strict return or ED visit precautions discussed with patient if she has signs of worsening infection.  She agrees to plan of care.   Final Clinical Impressions(s) / UC Diagnoses   Final diagnoses:  Puncture wound of left upper arm, initial encounter  Cat bite of left upper arm, initial encounter  Cellulitis of left upper extremity     Discharge Instructions     Take the Augmentin as directed.  Your tetanus was updated today.    Keep your wound clean and dry.  Wash it gently twice a day with soap and water.  Apply an antibiotic cream twice a day.    Follow-up with your primary care provider or return here if you see signs of infection, such as increased pain, redness, pus-like drainage, warmth, fever, chills, or other concerning symptoms.         ED Prescriptions    Medication Sig Dispense Auth. Provider   amoxicillin-clavulanate (AUGMENTIN) 875-125 MG tablet Take 1 tablet by mouth every 12 (twelve) hours. 14 tablet Sharion Balloon, NP     PDMP not reviewed this encounter.   Sharion Balloon, NP 08/18/20 [phone]

## 2020-08-26 ENCOUNTER — Ambulatory Visit
Admission: RE | Admit: 2020-08-26 | Discharge: 2020-08-26 | Disposition: A | Discharge status: Home / Self Care | Payer: BC Managed Care – PPO | Source: Ambulatory Visit | Attending: Internal Medicine | Admitting: Internal Medicine

## 2020-08-26 ENCOUNTER — Other Ambulatory Visit: Payer: Self-pay

## 2020-08-26 DIAGNOSIS — E0789 Other specified disorders of thyroid: Secondary | ICD-10-CM | POA: Diagnosis present

## 2020-08-27 ENCOUNTER — Other Ambulatory Visit: Payer: Self-pay | Admitting: Internal Medicine

## 2020-08-27 ENCOUNTER — Encounter: Payer: Self-pay | Admitting: Internal Medicine

## 2020-08-27 DIAGNOSIS — E041 Nontoxic single thyroid nodule: Secondary | ICD-10-CM

## 2020-08-27 NOTE — Progress Notes (Signed)
Order placed for endocrinology referral.  

## 2020-08-31 NOTE — Telephone Encounter (Signed)
[name] - just to follow up on getting endocrinology appt scheduled.  Thank you.

## 2020-09-01 NOTE — Telephone Encounter (Signed)
Thank you.    Dr Fleurette Woolbright 

## 2020-09-10 ENCOUNTER — Other Ambulatory Visit: Payer: Self-pay | Admitting: Internal Medicine

## 2020-09-15 ENCOUNTER — Other Ambulatory Visit (INDEPENDENT_AMBULATORY_CARE_PROVIDER_SITE_OTHER): Payer: BC Managed Care – PPO

## 2020-09-15 ENCOUNTER — Other Ambulatory Visit: Payer: Self-pay

## 2020-09-15 DIAGNOSIS — R7989 Other specified abnormal findings of blood chemistry: Secondary | ICD-10-CM

## 2020-09-16 LAB — T4, FREE: Free T4: 0.65 ng/dL (ref 0.60–1.60)

## 2020-09-16 LAB — TSH: TSH: 4.4 u[IU]/mL (ref 0.35–4.50)

## 2020-09-24 ENCOUNTER — Encounter: Payer: Self-pay | Admitting: Emergency Medicine

## 2020-09-24 ENCOUNTER — Other Ambulatory Visit: Payer: Self-pay

## 2020-09-24 ENCOUNTER — Ambulatory Visit: Payer: Self-pay

## 2020-09-24 ENCOUNTER — Ambulatory Visit
Admission: EM | Admit: 2020-09-24 | Discharge: 2020-09-24 | Disposition: A | Discharge status: Home / Self Care | Payer: BC Managed Care – PPO | Attending: Family Medicine | Admitting: Family Medicine

## 2020-09-24 DIAGNOSIS — Z20822 Contact with and (suspected) exposure to covid-19: Secondary | ICD-10-CM | POA: Diagnosis not present

## 2020-09-24 DIAGNOSIS — J069 Acute upper respiratory infection, unspecified: Secondary | ICD-10-CM | POA: Diagnosis not present

## 2020-09-24 LAB — SARS CORONAVIRUS 2 (TAT 6-24 HRS): SARS Coronavirus 2: NEGATIVE

## 2020-09-24 MED ORDER — PROMETHAZINE-DM 6.25-15 MG/5ML PO SYRP: 5.0000 mL | ORAL_SOLUTION | Freq: Four times a day (QID) | ORAL | 0 refills | Status: DC | PRN

## 2020-09-24 MED ORDER — BENZONATATE 100 MG PO CAPS: 200.0000 mg | ORAL_CAPSULE | Freq: Three times a day (TID) | ORAL | 0 refills | Status: DC

## 2020-09-24 MED ORDER — IPRATROPIUM BROMIDE 0.06 % NA SOLN: 2.0000 | Freq: Four times a day (QID) | NASAL | 12 refills | Status: DC

## 2020-09-24 NOTE — ED Provider Notes (Signed)
[facility]    CSN: [id number] Arrival date & time: 09/24/20  1050      History   Chief Complaint Chief Complaint  Patient presents with  . Cough  . Facial Pain    HPI Erika Daugherty is a 55 y.o. female.   HPI   55 year old female here for evaluation of nasal congestion and facial pain.  Patient reports that she has been experiencing nasal congestion and, pain around her eyes, nonproductive cough, and postnasal drip for the last 2 days.  She is also had some intermittent ear pain.  She denies nasal discharge, fever, sore throat, shortness breath or wheezing, GI complaints, or body aches.  Past Medical History:  Diagnosis Date  . Allergy   . GERD (gastroesophageal reflux disease)   . Heart murmur     Patient Active Problem List   Diagnosis Date Noted  . Thyroid nodule 08/05/2020  . Colon cancer screening 05/31/2020  . Dysuria 11/25/2019  . Congestion of nasal sinus 03/10/2019  . Soft tissue mass 01/01/2018  . Head pain 01/01/2018  . Muscle ache 12/27/2017  . Stress 08/18/2017  . Angioedema 12/28/2016  . Annual physical exam 12/16/2016  . Benign essential HTN 06/17/2015  . 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 Palpitations 03/05/2012  . Fatigue 01/30/2012    Past Surgical History:  Procedure Laterality Date  . VAGINAL DELIVERY     2  . WISDOM TOOTH EXTRACTION      OB History   No obstetric history on file.      Home Medications    Prior to Admission medications   Medication Sig Start Date End Date Taking? Authorizing Provider  amLODipine (NORVASC) 5 MG tablet TAKE 1 TABLET BY MOUTH DAILY 09/10/20  Yes Einar Pheasant, MD  benzonatate (TESSALON) 100 MG capsule Take 2 capsules (200 mg total) by mouth every 8 (eight) hours. 09/24/20  Yes Margarette Canada, NP  ipratropium (ATROVENT) 0.06 % nasal spray Place 2 sprays into both nostrils 4 (four) times daily. 09/24/20  Yes Margarette Canada, NP  metoprolol succinate (TOPROL-XL) 25 MG 24 hr tablet TAKE 1 TABLET  BY MOUTH EVERY DAY 05/08/20  Yes Einar Pheasant, MD  promethazine-dextromethorphan (PROMETHAZINE-DM) 6.25-15 MG/5ML syrup Take 5 mLs by mouth 4 (four) times daily as needed. 09/24/20  Yes Margarette Canada, NP    Family History Family History  Problem Relation Age of Onset  . Hypertension Mother   . Hyperlipidemia Mother        triglycerides  . Cancer Father        bile duct  . Heart disease Father 3    Social History Social History   Tobacco Use  . Smoking status: Never Smoker  . Smokeless tobacco: Never Used  Vaping Use  . Vaping Use: Never used  Substance Use Topics  . Alcohol use: No    Comment: rare  . Drug use: No     Allergies   Erythromycin, Lisinopril, Prednisone, and Strawberry extract   Review of Systems Review of Systems  Constitutional: Negative for activity change, appetite change and fever.  HENT: Positive for congestion, ear pain, postnasal drip and sinus pain. Negative for sore throat.   Respiratory: Positive for cough. Negative for shortness of breath and wheezing.   Gastrointestinal: Negative for diarrhea, nausea and vomiting.  Musculoskeletal: Negative for arthralgias and myalgias.  Skin: Negative for rash.  Hematological: Negative.   Psychiatric/Behavioral: Negative.      Physical Exam Triage Vital Signs ED Triage Vitals  Enc  Vitals Group     BP 09/24/20 1121 137/85     Pulse Rate 09/24/20 1121 72     Resp 09/24/20 1121 18     Temp 09/24/20 1121 98.9 F (37.2 C)     Temp Source 09/24/20 1121 Oral     SpO2 09/24/20 1121 100 %     Weight 09/24/20 1122 141 lb 12.1 oz (64.3 kg)     Height 09/24/20 1122 5\' 4"  (1.626 m)     Head Circumference --      Peak Flow --      Pain Score 09/24/20 1121 5     Pain Loc --      Pain Edu? --      Excl. in [place]? --    No data found.  Updated Vital Signs BP 137/85 (BP Location: Left Arm)   Pulse 72   Temp 98.9 F (37.2 C) (Oral)   Resp 18   Ht 5\' 4"  (1.626 m)   Wt 141 lb 12.1 oz (64.3 kg)   LMP  01/18/2018   SpO2 100%   BMI 24.33 kg/m   Visual Acuity Right Eye Distance:   Left Eye Distance:   Bilateral Distance:    Right Eye Near:   Left Eye Near:    Bilateral Near:     Physical Exam Vitals and nursing note reviewed.  Constitutional:      General: She is not in acute distress.    Appearance: Normal appearance. She is normal weight. She is not ill-appearing.  HENT:     Head: Normocephalic and atraumatic.     Right Ear: Tympanic membrane, ear canal and external ear normal. There is no impacted cerumen.     Left Ear: Tympanic membrane, ear canal and external ear normal. There is no impacted cerumen.     Nose: Congestion and rhinorrhea present.     Mouth/Throat:     Mouth: Mucous membranes are moist.     Pharynx: Oropharynx is clear. Posterior oropharyngeal erythema present.  Cardiovascular:     Rate and Rhythm: Normal rate and regular rhythm.     Pulses: Normal pulses.     Heart sounds: Normal heart sounds. No murmur heard. No gallop.   Pulmonary:     Effort: Pulmonary effort is normal.     Breath sounds: Normal breath sounds. No wheezing, rhonchi or rales.  Musculoskeletal:     Cervical back: Normal range of motion and neck supple.  Lymphadenopathy:     Cervical: No cervical adenopathy.  Skin:    General: Skin is warm and dry.     Capillary Refill: Capillary refill takes less than 2 seconds.     Findings: No erythema or rash.  Neurological:     General: No focal deficit present.     Mental Status: She is alert and oriented to person, place, and time.  Psychiatric:        Mood and Affect: Mood normal.        Behavior: Behavior normal.        Thought Content: Thought content normal.        Judgment: Judgment normal.      UC Treatments / Results  Labs (all labs ordered are listed, but only abnormal results are displayed) Labs Reviewed  SARS CORONAVIRUS 2 (TAT 6-24 HRS)    EKG   Radiology No results found.  Procedures Procedures (including  critical care time)  Medications Ordered in UC Medications - No data to display  Initial Impression /  Assessment and Plan / UC Course  I have reviewed the triage vital signs and the nursing notes.  Pertinent labs & imaging results that were available during my care of the patient were reviewed by me and considered in my medical decision making (see chart for details).   Patient is a very pleasant, nontoxic-appearing 55 year old female here for evaluation of upper respiratory complaints that [place] for last 2 days and consist of nasal congestion with sinus pain around her eyes, ear pain, postnasal drip, and nonproductive cough.  Physical exam reveals pearly gray tympanic membranes bilaterally with a normal light reflex and clear external auditory canals.  Nasal mucosa is erythematous and edematous with scant clear nasal discharge.  Oropharyngeal exam reveals mild erythema and cobblestoning with clear postnasal drip.  No cervical lymphadenopathy.  Cardiopulmonary exam is benign.  Patient's exam is consistent with an upper respiratory infection and will treat her for viral URI with a cough.  Patient swab for COVID and she will isolate until the results are back.   Final Clinical Impressions(s) / UC Diagnoses   Final diagnoses:  Viral URI with cough     Discharge Instructions     Use the Atrovent nasal spray, 2 squirts in each nostril every 6 hours, as needed for runny nose and postnasal drip.  Use the Tessalon Perles every 8 hours during the day.  Take them with a small sip of water.  They may give you some numbness to the base of your tongue or a metallic taste in your mouth, this is normal.  Use the Promethazine DM cough syrup at bedtime for cough and congestion.  It will make you drowsy so do not take it during the day.  Return for reevaluation or see your primary care provider for any new or worsening symptoms.     ED Prescriptions    Medication Sig Dispense Auth. Provider    ipratropium (ATROVENT) 0.06 % nasal spray Place 2 sprays into both nostrils 4 (four) times daily. 15 mL Margarette Canada, NP   benzonatate (TESSALON) 100 MG capsule Take 2 capsules (200 mg total) by mouth every 8 (eight) hours. 21 capsule Margarette Canada, NP   promethazine-dextromethorphan (PROMETHAZINE-DM) 6.25-15 MG/5ML syrup Take 5 mLs by mouth 4 (four) times daily as needed. 118 mL Margarette Canada, NP     PDMP not reviewed this encounter.   Margarette Canada, NP 09/24/20 1201

## 2020-09-24 NOTE — ED Triage Notes (Signed)
Pt c/o nasal congestion, facial pain, facial pain. Started 2 days ago.

## 2020-09-24 NOTE — Discharge Instructions (Addendum)

## 2020-10-06 ENCOUNTER — Encounter: Payer: Self-pay | Admitting: Internal Medicine

## 2020-10-06 ENCOUNTER — Ambulatory Visit
Admission: RE | Admit: 2020-10-06 | Discharge: 2020-10-06 | Disposition: A | Discharge status: Home / Self Care | Payer: BC Managed Care – PPO | Source: Ambulatory Visit | Attending: Physician Assistant | Admitting: Physician Assistant

## 2020-10-06 ENCOUNTER — Other Ambulatory Visit: Payer: Self-pay

## 2020-10-06 VITALS — BP 151/91 | HR 65 | Temp 97.9°F | Resp 18 | Ht 64.0 in | Wt 141.8 lb

## 2020-10-06 DIAGNOSIS — R300 Dysuria: Secondary | ICD-10-CM

## 2020-10-06 DIAGNOSIS — N3001 Acute cystitis with hematuria: Secondary | ICD-10-CM

## 2020-10-06 LAB — URINALYSIS, COMPLETE (UACMP) WITH MICROSCOPIC
Bilirubin Urine: NEGATIVE
Glucose, UA: NEGATIVE mg/dL
Ketones, ur: NEGATIVE mg/dL
Nitrite: NEGATIVE
Protein, ur: NEGATIVE mg/dL
Specific Gravity, Urine: 1.01 (ref 1.005–1.030)
WBC, UA: >50 WBC/hpf (ref 0–5)
pH: 6 (ref 5.0–8.0)

## 2020-10-06 MED ORDER — PHENAZOPYRIDINE HCL 200 MG PO TABS: 200.0000 mg | ORAL_TABLET | Freq: Three times a day (TID) | ORAL | 0 refills | Status: DC

## 2020-10-06 MED ORDER — NITROFURANTOIN MONOHYD MACRO 100 MG PO CAPS: 100.0000 mg | ORAL_CAPSULE | Freq: Two times a day (BID) | ORAL | 0 refills | Status: AC

## 2020-10-06 NOTE — ED Triage Notes (Signed)
Pt c/o dysuria, hematuria, urinary frequency, and pelvic pressure. Started this morning. Denies lower back pain or fever.

## 2020-10-06 NOTE — ED Provider Notes (Signed)
[facility]    CSN: [id number] Arrival date & time: 10/06/20  1534      History   Chief Complaint Chief Complaint  Patient presents with  . Dysuria    HPI Erika Daugherty is a 55 y.o. female presenting for onset of dysuria, urinary frequency and urgency as well as suprapubic pressure this morning.  Patient also states that whenever she wiped earlier she noticed a small amount of blood on the tissue.  She denies any fever, fatigue, chills, back pain or significant abdominal discomfort.  Patient believes she may have a UTI based on her symptoms.  She has not taken any OTC meds for her symptoms.  Patient is otherwise healthy.  She has no concerns for any STIs.  No abnormal vaginal discharge, itching or odor.  No other complaints.  HPI  Past Medical History:  Diagnosis Date  . Allergy   . GERD (gastroesophageal reflux disease)   . Heart murmur     Patient Active Problem List   Diagnosis Date Noted  . Thyroid nodule 08/05/2020  . Colon cancer screening 05/31/2020  . Dysuria 11/25/2019  . Congestion of nasal sinus 03/10/2019  . Soft tissue mass 01/01/2018  . Head pain 01/01/2018  . Muscle ache 12/27/2017  . Stress 08/18/2017  . Angioedema 12/28/2016  . Annual physical exam 12/16/2016  . Benign essential HTN 06/17/2015  . 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 Palpitations 03/05/2012  . Fatigue 01/30/2012    Past Surgical History:  Procedure Laterality Date  . VAGINAL DELIVERY     2  . WISDOM TOOTH EXTRACTION      OB History   No obstetric history on file.      Home Medications    Prior to Admission medications   Medication Sig Start Date End Date Taking? Authorizing Provider  amLODipine (NORVASC) 5 MG tablet TAKE 1 TABLET BY MOUTH DAILY 09/10/20  Yes Einar Pheasant, MD  ipratropium (ATROVENT) 0.06 % nasal spray Place 2 sprays into both nostrils 4 (four) times daily. 09/24/20  Yes Margarette Canada, NP  metoprolol succinate (TOPROL-XL) 25 MG 24 hr tablet TAKE 1  TABLET BY MOUTH EVERY DAY 05/08/20  Yes Einar Pheasant, MD  nitrofurantoin, macrocrystal-monohydrate, (MACROBID) 100 MG capsule Take 1 capsule (100 mg total) by mouth 2 (two) times daily for 5 days. 10/06/20 10/11/20 Yes Danton Clap, PA-C  phenazopyridine (PYRIDIUM) 200 MG tablet Take 1 tablet (200 mg total) by mouth 3 (three) times daily. 10/06/20  Yes Danton Clap, PA-C  benzonatate (TESSALON) 100 MG capsule Take 2 capsules (200 mg total) by mouth every 8 (eight) hours. 09/24/20   Margarette Canada, NP  bifidobacterium infantis (ALIGN) capsule Take 1 capsule by mouth daily.    [provider]  omeprazole (PRILOSEC OTC) 20 MG tablet Take by mouth.    [provider]  promethazine-dextromethorphan (PROMETHAZINE-DM) 6.25-15 MG/5ML syrup Take 5 mLs by mouth 4 (four) times daily as needed. Patient taking differently: Take 5 mLs by mouth 4 (four) times daily as needed. 09/24/20   Margarette Canada, NP    Family History Family History  Problem Relation Age of Onset  . Hypertension Mother   . Hyperlipidemia Mother        triglycerides  . Cancer Father        bile duct  . Heart disease Father 49    Social History Social History   Tobacco Use  . Smoking status: Never Smoker  . Smokeless tobacco: Never Used  Vaping Use  .  Vaping Use: Never used  Substance Use Topics  . Alcohol use: No    Comment: rare  . Drug use: No     Allergies   Erythromycin, Lisinopril, Prednisone, and Strawberry extract   Review of Systems Review of Systems  Constitutional: Negative for chills, fatigue and fever.  Gastrointestinal: Negative for abdominal pain, diarrhea, nausea and vomiting.  Genitourinary: Positive for dysuria, frequency and urgency. Negative for decreased urine volume, flank pain, hematuria, pelvic pain, vaginal bleeding, vaginal discharge and vaginal pain.  Musculoskeletal: Negative for back pain.  Skin: Negative for rash.     Physical Exam Triage Vital Signs ED Triage Vitals  [10/06/20 1550]  Enc Vitals Group     BP (!) 151/91     Pulse Rate 65     Resp 18     Temp 97.9 F (36.6 C)     Temp Source Oral     SpO2 98 %     Weight 141 lb 12.1 oz (64.3 kg)     Height 5\' 4"  (1.626 m)     Head Circumference      Peak Flow      Pain Score 1     Pain Loc      Pain Edu?      Excl. in [place]?    No data found.  Updated Vital Signs BP (!) 151/91 (BP Location: Left Arm)   Pulse 65   Temp 97.9 F (36.6 C) (Oral)   Resp 18   Ht 5\' 4"  (1.626 m)   Wt 141 lb 12.1 oz (64.3 kg)   LMP 01/18/2018   SpO2 98%   BMI 24.33 kg/m       Physical Exam Vitals and nursing note reviewed.  Constitutional:      General: She is not in acute distress.    Appearance: Normal appearance. She is not ill-appearing or toxic-appearing.  HENT:     Head: Normocephalic and atraumatic.  Eyes:     General: No scleral icterus.       Right eye: No discharge.        Left eye: No discharge.     Conjunctiva/sclera: Conjunctivae normal.  Cardiovascular:     Rate and Rhythm: Normal rate and regular rhythm.     Heart sounds: Normal heart sounds.  Pulmonary:     Effort: Pulmonary effort is normal. No respiratory distress.     Breath sounds: Normal breath sounds.  Abdominal:     Palpations: Abdomen is soft.     Tenderness: There is no abdominal tenderness. There is no right CVA tenderness or left CVA tenderness.  Musculoskeletal:     Cervical back: Neck supple.  Skin:    General: Skin is dry.  Neurological:     General: No focal deficit present.     Mental Status: She is alert. Mental status is at baseline.     Motor: No weakness.     Gait: Gait normal.  Psychiatric:        Mood and Affect: Mood normal.        Behavior: Behavior normal.        Thought Content: Thought content normal.      UC Treatments / Results  Labs (all labs ordered are listed, but only abnormal results are displayed) Labs Reviewed  URINALYSIS, COMPLETE (UACMP) WITH MICROSCOPIC - Abnormal; Notable for  the following components:      Result Value   APPearance HAZY (*)    Hgb urine dipstick LARGE (*)  Leukocytes,Ua LARGE (*)    Bacteria, UA FEW (*)    All other components within normal limits  URINE CULTURE    EKG   Radiology No results found.  Procedures Procedures (including critical care time)  Medications Ordered in UC Medications - No data to display  Initial Impression / Assessment and Plan / UC Course  I have reviewed the triage vital signs and the nursing notes.  Pertinent labs & imaging results that were available during my care of the patient were reviewed by me and considered in my medical decision making (see chart for details).   55 year old female presenting for onset of dysuria, urinary frequency and urgency as well as suspected hematuria.  Vital signs are stable.  She is afebrile.  Exam is benign today.  The urinalysis today shows large blood and large leukocytes with few bacteria.  We will culture urine and treat for UTI at this time with Macrobid.  Encouraged increased rest and fluids.  I also did send Pyridium.  Advised her that someone will contact her if medication needs to be adjusted based on the culture result.  Advised to follow-up with [place] if any worsening symptoms.   Final Clinical Impressions(s) / UC Diagnoses   Final diagnoses:  Acute cystitis with hematuria  Dysuria     Discharge Instructions     UTI: Based on either symptoms or urinalysis, you may have a urinary tract infection. We will send the urine for culture and call with results in a few days. Begin antibiotics at this time. Your symptoms should be much improved over the next 2-3 days. Increase rest and fluid intake. If for some reason symptoms are worsening or not improving after a couple of days or the urine culture determines the antibiotics you are taking will not treat the infection, the antibiotics may be changed. Return or go to ER for fever, back pain, worsening urinary pain,  discharge, increased blood in urine. May take Tylenol or Motrin OTC for pain relief or consider AZO if no contraindications     ED Prescriptions    Medication Sig Dispense Auth. Provider   nitrofurantoin, macrocrystal-monohydrate, (MACROBID) 100 MG capsule Take 1 capsule (100 mg total) by mouth 2 (two) times daily for 5 days. 10 capsule Laurene Footman B, PA-C   phenazopyridine (PYRIDIUM) 200 MG tablet Take 1 tablet (200 mg total) by mouth 3 (three) times daily. 6 tablet Gretta Cool     PDMP not reviewed this encounter.   Danton Clap, PA-C 10/06/20 1614

## 2020-10-06 NOTE — Telephone Encounter (Signed)
Called pt she is at urgent care in [facility]

## 2020-10-06 NOTE — Telephone Encounter (Signed)
Access Nurse Documentation   

## 2020-10-06 NOTE — Discharge Instructions (Addendum)

## 2020-10-06 NOTE — Telephone Encounter (Signed)
Transfrered to access nurse   Pt thinks that she ha a UTI, she has pressure and blood in her urine when she wipes    Access nurse aware no available appts

## 2020-10-08 LAB — URINE CULTURE: Culture: NO GROWTH

## 2020-10-12 NOTE — Telephone Encounter (Signed)
Called to offer Patient an appointment today with another provider as Dr Nicki Reaper is booked out for the week.   Patient states she was seen at urgent care and these notes are available in epic. She would like to keep her appointment for 11/04/20 if that is okay as she is not currently having any symptoms.   Patient wanting to know if she needs to be seen sooner or if she can wait until 11/04/20? Please advise

## 2020-10-12 NOTE — Telephone Encounter (Signed)
If having persistent symptoms, I would recommend earlier appt.  Can see me about work in for appt if needed.

## 2020-10-13 NOTE — Telephone Encounter (Signed)
Ok to work in this week

## 2020-10-13 NOTE — Telephone Encounter (Signed)
Patient scheduled.

## 2020-10-13 NOTE — Telephone Encounter (Signed)
Patient stated she has not had any issues at all since the day she went to urgent care. She was taking abx and pyridium until she found out there was no UTI. She is urinating more often but she has also increased her water intake. She thinks that she may have passed a kidney stone because the day she went she had a lot of pressure when she went to urinate and then some blood on the tissue when she wiped but no symptoms since. She is ok to do work in appt this week if you think it is needed but she is also ok to wait if you are agreeable. She is leaving on Friday AM to go on vacation for a week so she says that no f/u testing will be able to be done until almost time for her scheduled follow up in July anyways. I offered her an appointment on Wednesday and Thursday this week.

## 2020-10-15 ENCOUNTER — Ambulatory Visit: Payer: BC Managed Care – PPO | Admitting: Internal Medicine

## 2020-10-15 ENCOUNTER — Other Ambulatory Visit: Payer: Self-pay

## 2020-10-15 VITALS — BP 134/72 | HR 66 | Temp 97.9°F | Resp 16 | Ht 64.0 in | Wt 142.0 lb

## 2020-10-15 DIAGNOSIS — E041 Nontoxic single thyroid nodule: Secondary | ICD-10-CM | POA: Diagnosis not present

## 2020-10-15 DIAGNOSIS — I10 Essential (primary) hypertension: Secondary | ICD-10-CM | POA: Diagnosis not present

## 2020-10-15 DIAGNOSIS — R319 Hematuria, unspecified: Secondary | ICD-10-CM | POA: Diagnosis not present

## 2020-10-15 DIAGNOSIS — R300 Dysuria: Secondary | ICD-10-CM

## 2020-10-15 DIAGNOSIS — F439 Reaction to severe stress, unspecified: Secondary | ICD-10-CM

## 2020-10-15 NOTE — Progress Notes (Signed)
Patient ID: Erika Daugherty, female   DOB: December 19, 1965, 55 y.o.   MRN: [id number]   Subjective:    Patient ID: Erika Daugherty, female    DOB: 1965/07/12, 55 y.o.   MRN: [id number]  HPI This visit occurred during the SARS-CoV-2 public health emergency.  Safety protocols were in place, including screening questions prior to the visit, additional usage of staff PPE, and extensive cleaning of exam room while observing appropriate contact time as indicated for disinfecting solutions.   Patient here for work in appt.  Seen in ER 10/06/20 for dysuria, urinary frequency and urgency and suprapubic pressure.  Also noticed a small amount of blood on tissue when wiped.  Treated with macrobid and pyridium.  Urine culture returned negative.  Stopped macrobid.  Symptoms improved now.  No urinary symptoms.  No further blood noticed.  No vaginal symptoms.  Eating and drinking.  No nausea or vomiting.  Handling stress.    Past Medical History:  Diagnosis Date   Allergy    GERD (gastroesophageal reflux disease)    Heart murmur    Past Surgical History:  Procedure Laterality Date   VAGINAL DELIVERY     2   WISDOM TOOTH EXTRACTION     Family History  Problem Relation Age of Onset   Hypertension Mother    Hyperlipidemia Mother        triglycerides   Cancer Father        bile duct   Heart disease Father 41   Social History   Socioeconomic History   Marital status: Married    Spouse name: Not on file   Number of children: Not on file   Years of education: Not on file   Highest education level: Not on file  Occupational History   Not on file  Tobacco Use   Smoking status: Never   Smokeless tobacco: Never  Vaping Use   Vaping Use: Never used  Substance and Sexual Activity   Alcohol use: No    Comment: rare   Drug use: No   Sexual activity: Not on file  Other Topics Concern   Not on file  Social History Narrative   Lives in [place], [place] with husband and two children (16YO, 11YO) and dog.       Work - Arboriculturist   Diet - relative healthy   Exercise - walk or elliptical 40min 5 days per week   Social Determinants of Health   Financial Resource Strain: Not on file  Food Insecurity: Not on file  Transportation Needs: Not on file  Physical Activity: Not on file  Stress: Not on file  Social Connections: Not on file    Outpatient Encounter Medications as of 10/15/2020  Medication Sig   amLODipine (NORVASC) 5 MG tablet TAKE 1 TABLET BY MOUTH DAILY   bifidobacterium infantis (ALIGN) capsule Take 1 capsule by mouth daily.   metoprolol succinate (TOPROL-XL) 25 MG 24 hr tablet TAKE 1 TABLET BY MOUTH EVERY DAY   omeprazole (PRILOSEC OTC) 20 MG tablet Take by mouth.   triamcinolone (NASACORT) 55 MCG/ACT AERO nasal inhaler Place 2 sprays into the nose daily.   [DISCONTINUED] benzonatate (TESSALON) 100 MG capsule Take 2 capsules (200 mg total) by mouth every 8 (eight) hours. (Patient not taking: Reported on 10/15/2020)   [DISCONTINUED] ipratropium (ATROVENT) 0.06 % nasal spray Place 2 sprays into both nostrils 4 (four) times daily.   [DISCONTINUED] phenazopyridine (PYRIDIUM) 200 MG tablet Take 1 tablet (200 mg total) by mouth  3 (three) times daily. (Patient not taking: Reported on 10/15/2020)   [DISCONTINUED] promethazine-dextromethorphan (PROMETHAZINE-DM) 6.25-15 MG/5ML syrup Take 5 mLs by mouth 4 (four) times daily as needed. (Patient not taking: Reported on 10/15/2020)   No facility-administered encounter medications on file as of 10/15/2020.     Review of Systems  Constitutional:  Negative for appetite change and unexpected weight change.  HENT:  Negative for congestion.   Respiratory:  Negative for cough, chest tightness and shortness of breath.   Cardiovascular:  Negative for chest pain, palpitations and leg swelling.  Gastrointestinal:  Negative for abdominal pain, diarrhea, nausea and vomiting.  Genitourinary:  Negative for difficulty urinating and dysuria.  Musculoskeletal:   Negative for joint swelling and myalgias.  Skin:  Negative for color change and rash.  Neurological:  Negative for dizziness, light-headedness and headaches.  Psychiatric/Behavioral:  Negative for agitation and dysphoric mood.       Objective:    Physical Exam Vitals reviewed.  Constitutional:      General: She is not in acute distress.    Appearance: Normal appearance.  HENT:     Head: Normocephalic and atraumatic.     Right Ear: External ear normal.     Left Ear: External ear normal.  Eyes:     General: No scleral icterus.       Right eye: No discharge.        Left eye: No discharge.     Conjunctiva/sclera: Conjunctivae normal.  Neck:     Thyroid: No thyromegaly.  Cardiovascular:     Rate and Rhythm: Normal rate and regular rhythm.  Pulmonary:     Effort: No respiratory distress.     Breath sounds: Normal breath sounds. No wheezing.  Abdominal:     General: Bowel sounds are normal.     Palpations: Abdomen is soft.     Tenderness: There is no abdominal tenderness.  Musculoskeletal:        General: No swelling or tenderness.     Cervical back: Neck supple. No tenderness.  Lymphadenopathy:     Cervical: No cervical adenopathy.  Skin:    Findings: No erythema or rash.  Neurological:     Mental Status: She is alert.  Psychiatric:        Mood and Affect: Mood normal.        Behavior: Behavior normal.    BP 134/72   Pulse 66   Temp 97.9 F (36.6 C)   Resp 16   Ht 5\' 4"  (1.626 m)   Wt 142 lb (64.4 kg)   LMP 01/18/2018   SpO2 99%   BMI 24.37 kg/m  Wt Readings from Last 3 Encounters:  10/15/20 142 lb (64.4 kg)  10/06/20 141 lb 12.1 oz (64.3 kg)  09/24/20 141 lb 12.1 oz (64.3 kg)     Lab Results  Component Value Date   WBC 4.3 02/17/2020   HGB 13.7 02/17/2020   HCT 40.2 02/17/2020   PLT 227.0 02/17/2020   GLUCOSE 91 08/05/2020   CHOL 149 02/17/2020   TRIG 78.0 02/17/2020   HDL 51.40 02/17/2020   LDLCALC 82 02/17/2020   ALT 19 02/17/2020   AST 21  02/17/2020   NA 140 08/05/2020   K 3.7 08/05/2020   CL 103 08/05/2020   CREATININE 0.89 08/05/2020   BUN 13 08/05/2020   CO2 31 08/05/2020   TSH 4.40 09/15/2020   HGBA1C 5.3 12/14/2015   MICROALBUR 0.8 11/05/2013       Assessment & Plan:  Problem List Items Addressed This Visit     Benign essential HTN    Blood pressure as outlined.  Continue amlodipine and metoprolol.  hve discussed blood pressure guidelines.  Continue follow pressure.  Follow metabolic panel.        Dysuria    Previous urinary symptoms as outlined.  Initially treated with macrobid.  Culture negative.  Off macrobid.  Symptoms resolved.  No further blood noticed.  Check urine to confirm clear.  Notify if return of symptoms.         Stress    Handling stress relatively well.  Follow.         Thyroid nodule    Keep f/u with endocrinology.         Other Visit Diagnoses     Hematuria, unspecified type    -  Primary   Relevant Orders   Urinalysis, Routine w reflex microscopic (Completed)        Einar Pheasant, MD

## 2020-10-16 LAB — URINALYSIS, ROUTINE W REFLEX MICROSCOPIC
Bilirubin Urine: NEGATIVE
Hgb urine dipstick: NEGATIVE
Ketones, ur: NEGATIVE
Leukocytes,Ua: NEGATIVE
Nitrite: NEGATIVE
RBC / HPF: NONE SEEN (ref 0–?)
Specific Gravity, Urine: <=1.005 — AB (ref 1.000–1.030)
Total Protein, Urine: NEGATIVE
Urine Glucose: NEGATIVE
Urobilinogen, UA: 0.2 (ref 0.0–1.0)
WBC, UA: NONE SEEN (ref 0–?)
pH: 7 (ref 5.0–8.0)

## 2020-10-25 ENCOUNTER — Encounter: Payer: Self-pay | Admitting: Internal Medicine

## 2020-10-25 NOTE — Assessment & Plan Note (Signed)
Handling stress relatively well.  Follow.

## 2020-10-25 NOTE — Assessment & Plan Note (Addendum)
Blood pressure as outlined.  Continue amlodipine and metoprolol.  hve discussed blood pressure guidelines.  Continue follow pressure.  Follow metabolic panel.

## 2020-10-25 NOTE — Assessment & Plan Note (Signed)
Previous urinary symptoms as outlined.  Initially treated with macrobid.  Culture negative.  Off macrobid.  Symptoms resolved.  No further blood noticed.  Check urine to confirm clear.  Notify if return of symptoms.

## 2020-10-25 NOTE — Assessment & Plan Note (Signed)
Keep f/u with endocrinology.

## 2020-11-04 ENCOUNTER — Ambulatory Visit: Payer: BC Managed Care – PPO | Admitting: Internal Medicine

## 2021-01-21 ENCOUNTER — Encounter: Payer: Self-pay | Admitting: Internal Medicine

## 2021-02-18 ENCOUNTER — Encounter: Payer: Self-pay | Admitting: Internal Medicine

## 2021-02-18 ENCOUNTER — Ambulatory Visit (INDEPENDENT_AMBULATORY_CARE_PROVIDER_SITE_OTHER): Payer: BC Managed Care – PPO | Admitting: Internal Medicine

## 2021-02-18 ENCOUNTER — Other Ambulatory Visit: Payer: Self-pay | Admitting: Internal Medicine

## 2021-02-18 ENCOUNTER — Other Ambulatory Visit: Payer: Self-pay

## 2021-02-18 VITALS — BP 128/70 | HR 64 | Temp 97.9°F | Resp 16 | Ht 64.0 in | Wt 142.0 lb

## 2021-02-18 DIAGNOSIS — E041 Nontoxic single thyroid nodule: Secondary | ICD-10-CM

## 2021-02-18 DIAGNOSIS — F439 Reaction to severe stress, unspecified: Secondary | ICD-10-CM

## 2021-02-18 DIAGNOSIS — I10 Essential (primary) hypertension: Secondary | ICD-10-CM | POA: Diagnosis not present

## 2021-02-18 DIAGNOSIS — M7989 Other specified soft tissue disorders: Secondary | ICD-10-CM

## 2021-02-18 DIAGNOSIS — Z1211 Encounter for screening for malignant neoplasm of colon: Secondary | ICD-10-CM

## 2021-02-18 DIAGNOSIS — Z23 Encounter for immunization: Secondary | ICD-10-CM | POA: Diagnosis not present

## 2021-02-18 LAB — TSH: TSH: 3.13 u[IU]/mL (ref 0.35–5.50)

## 2021-02-18 LAB — COMPREHENSIVE METABOLIC PANEL
ALT: 28 U/L (ref 0–35)
AST: 26 U/L (ref 0–37)
Albumin: 4.3 g/dL (ref 3.5–5.2)
Alkaline Phosphatase: 91 U/L (ref 39–117)
BUN: 14 mg/dL (ref 6–23)
CO2: 32 mEq/L (ref 19–32)
Calcium: 9.4 mg/dL (ref 8.4–10.5)
Chloride: 103 mEq/L (ref 96–112)
Creatinine, Ser: 0.88 mg/dL (ref 0.40–1.20)
GFR: 74.93 mL/min (ref >60.00)
Glucose, Bld: 90 mg/dL (ref 70–99)
Potassium: 3.3 mEq/L — ABNORMAL LOW (ref 3.5–5.1)
Sodium: 141 mEq/L (ref 135–145)
Total Bilirubin: 0.6 mg/dL (ref 0.2–1.2)
Total Protein: 8.5 g/dL — ABNORMAL HIGH (ref 6.0–8.3)

## 2021-02-18 LAB — CBC WITH DIFFERENTIAL/PLATELET
Basophils Absolute: 0 10*3/uL (ref 0.0–0.1)
Basophils Relative: 0.6 % (ref 0.0–3.0)
Eosinophils Absolute: 0.1 10*3/uL (ref 0.0–0.7)
Eosinophils Relative: 1.9 % (ref 0.0–5.0)
HCT: 40.2 % (ref 36.0–46.0)
Hemoglobin: 13.6 g/dL (ref 12.0–15.0)
Lymphocytes Relative: 37.7 % (ref 12.0–46.0)
Lymphs Abs: 1.7 10*3/uL (ref 0.7–4.0)
MCHC: 33.8 g/dL (ref 30.0–36.0)
MCV: 90.7 fl (ref 78.0–100.0)
Monocytes Absolute: 0.5 10*3/uL (ref 0.1–1.0)
Monocytes Relative: 11.5 % (ref 3.0–12.0)
Neutro Abs: 2.2 10*3/uL (ref 1.4–7.7)
Neutrophils Relative %: 48.3 % (ref 43.0–77.0)
Platelets: 227 10*3/uL (ref 150.0–400.0)
RBC: 4.43 Mil/uL (ref 3.87–5.11)
RDW: 12.7 % (ref 11.5–15.5)
WBC: 4.5 10*3/uL (ref 4.0–10.5)

## 2021-02-18 LAB — LIPID PANEL
Cholesterol: 148 mg/dL (ref 0–200)
HDL: 49.8 mg/dL (ref >39.00)
LDL Cholesterol: 81 mg/dL (ref 0–99)
NonHDL: 98.28
Total CHOL/HDL Ratio: 3
Triglycerides: 88 mg/dL (ref 0.0–149.0)
VLDL: 17.6 mg/dL (ref 0.0–40.0)

## 2021-02-18 NOTE — Progress Notes (Signed)
Patient ID: Erika Daugherty, female   DOB: [birth year], 55 y.o.   MRN: [id number]   Subjective:    Patient ID: Erika Daugherty, female    DOB: Dec 30, 1965, 55 y.o.   MRN: [id number]  This visit occurred during the SARS-CoV-2 public health emergency.  Safety protocols were in place, including screening questions prior to the visit, additional usage of staff PPE, and extensive cleaning of exam room while observing appropriate contact time as indicated for disinfecting solutions.   Patient here for her scheduled physical.  Sees gyn.  Changed to f/u appt.     HPI She is doing relatively well.  Due to f/u gyn 03/08/21.  Stays active.  No chest pain or sob reported.  New job since 07/2020.  Overall appears to be handling things relatively well.  No abdominal pain or bowel change reported.  Mammogram through [facility].  She will schedule.  Agreeable for colonoscopy.  Saw Dr Bary Castilla - lipoma.     Past Medical History:  Diagnosis Date   Allergy    GERD (gastroesophageal reflux disease)    Heart murmur    Past Surgical History:  Procedure Laterality Date   VAGINAL DELIVERY     2   WISDOM TOOTH EXTRACTION     Family History  Problem Relation Age of Onset   Hypertension Mother    Hyperlipidemia Mother        triglycerides   Cancer Father        bile duct   Heart disease Father 47   Social History   Socioeconomic History   Marital status: Married    Spouse name: Not on file   Number of children: Not on file   Years of education: Not on file   Highest education level: Not on file  Occupational History   Not on file  Tobacco Use   Smoking status: Never   Smokeless tobacco: Never  Vaping Use   Vaping Use: Never used  Substance and Sexual Activity   Alcohol use: No    Comment: rare   Drug use: No   Sexual activity: Not on file  Other Topics Concern   Not on file  Social History Narrative   Lives in [place], [place] with husband and two children (16YO, 53YO) and dog.      Work  - Arboriculturist   Diet - relative healthy   Exercise - walk or elliptical 68min 5 days per week   Social Determinants of Health   Financial Resource Strain: Not on file  Food Insecurity: Not on file  Transportation Needs: Not on file  Physical Activity: Not on file  Stress: Not on file  Social Connections: Not on file     Review of Systems  Constitutional:  Negative for appetite change and unexpected weight change.  HENT:  Negative for congestion, sinus pressure and sore throat.   Eyes:  Negative for pain and visual disturbance.  Respiratory:  Negative for cough, chest tightness and shortness of breath.   Cardiovascular:  Negative for chest pain, palpitations and leg swelling.  Gastrointestinal:  Negative for abdominal pain, diarrhea, nausea and vomiting.  Genitourinary:  Negative for difficulty urinating and dysuria.  Musculoskeletal:  Negative for joint swelling and myalgias.  Skin:  Negative for color change and rash.  Neurological:  Negative for dizziness, light-headedness and headaches.  Hematological:  Negative for adenopathy. Does not bruise/bleed easily.  Psychiatric/Behavioral:  Negative for agitation and dysphoric mood.       Objective:  BP 128/70   Pulse 64   Temp 97.9 F (36.6 C)   Resp 16   Ht 5\' 4"  (1.626 m)   Wt 142 lb (64.4 kg)   LMP 01/18/2018   SpO2 97%   BMI 24.37 kg/m  Wt Readings from Last 3 Encounters:  02/18/21 142 lb (64.4 kg)  10/15/20 142 lb (64.4 kg)  10/06/20 141 lb 12.1 oz (64.3 kg)    Physical Exam Vitals reviewed.  Constitutional:      General: She is not in acute distress.    Appearance: Normal appearance.  HENT:     Head: Normocephalic and atraumatic.     Right Ear: External ear normal.     Left Ear: External ear normal.  Eyes:     General: No scleral icterus.       Right eye: No discharge.        Left eye: No discharge.     Conjunctiva/sclera: Conjunctivae normal.  Neck:     Thyroid: No thyromegaly.   Cardiovascular:     Rate and Rhythm: Normal rate and regular rhythm.  Pulmonary:     Effort: No respiratory distress.     Breath sounds: Normal breath sounds. No wheezing.  Abdominal:     General: Bowel sounds are normal.     Palpations: Abdomen is soft.     Tenderness: There is no abdominal tenderness.  Musculoskeletal:        General: No swelling or tenderness.     Cervical back: Neck supple. No tenderness.  Lymphadenopathy:     Cervical: No cervical adenopathy.  Skin:    Findings: No erythema or rash.  Neurological:     Mental Status: She is alert.  Psychiatric:        Mood and Affect: Mood normal.        Behavior: Behavior normal.     Outpatient Encounter Medications as of 02/18/2021  Medication Sig   bifidobacterium infantis (ALIGN) capsule Take 1 capsule by mouth daily.   omeprazole (PRILOSEC OTC) 20 MG tablet Take by mouth.   triamcinolone (NASACORT) 55 MCG/ACT AERO nasal inhaler Place 2 sprays into the nose daily.   [DISCONTINUED] amLODipine (NORVASC) 5 MG tablet TAKE 1 TABLET BY MOUTH DAILY   [DISCONTINUED] metoprolol succinate (TOPROL-XL) 25 MG 24 hr tablet TAKE 1 TABLET BY MOUTH EVERY DAY   No facility-administered encounter medications on file as of 02/18/2021.     Lab Results  Component Value Date   WBC 4.5 02/18/2021   HGB 13.6 02/18/2021   HCT 40.2 02/18/2021   PLT 227.0 02/18/2021   GLUCOSE 90 02/18/2021   CHOL 148 02/18/2021   TRIG 88.0 02/18/2021   HDL 49.80 02/18/2021   LDLCALC 81 02/18/2021   ALT 28 02/18/2021   AST 26 02/18/2021   NA 141 02/18/2021   K 3.3 (L) 02/18/2021   CL 103 02/18/2021   CREATININE 0.88 02/18/2021   BUN 14 02/18/2021   CO2 32 02/18/2021   TSH 3.13 02/18/2021   HGBA1C 5.3 12/14/2015   MICROALBUR 0.8 11/05/2013       Assessment & Plan:   Problem List Items Addressed This Visit     Benign essential HTN - Primary    Blood pressure as outlined.  Continue amlodipine and metoprolol.  hve discussed blood pressure  guidelines.  Continue follow pressure.  Follow metabolic panel.       Relevant Orders   CBC with Differential/Platelet (Completed)   Comprehensive metabolic panel (Completed)   Lipid panel (  Completed)   Colon cancer screening    Saw Dr Bary Castilla.  Canceled her colonoscopy.  Needs to reschedule.        Soft tissue mass    Saw Dr Terri Piedra.  Lipoma.  Follow.       Stress    Changed jobs.  Appears to be handling things relatively well.  Follow.        Thyroid nodule    Continue f/u with endocrinology.       Relevant Orders   TSH (Completed)   Other Visit Diagnoses     Need for immunization against influenza       Relevant Orders   Flu Vaccine QUAD 19mo+IM (Fluarix, Fluzone & Alfiuria Quad PF) (Completed)        Einar Pheasant, MD

## 2021-02-19 ENCOUNTER — Telehealth: Payer: Self-pay

## 2021-02-19 NOTE — Telephone Encounter (Signed)
LMTCB regarding labs 

## 2021-02-19 NOTE — Telephone Encounter (Signed)
Mailed to patient

## 2021-02-19 NOTE — Telephone Encounter (Signed)
Gave the patient her lab results. Please send the patient her food list on increased potassium foods.

## 2021-02-28 ENCOUNTER — Encounter: Payer: Self-pay | Admitting: Internal Medicine

## 2021-02-28 NOTE — Assessment & Plan Note (Signed)
Continue f/u with endocrinology.

## 2021-02-28 NOTE — Assessment & Plan Note (Signed)
Saw Dr Terri Piedra.  Lipoma.  Follow.

## 2021-02-28 NOTE — Assessment & Plan Note (Signed)
Saw Dr Bary Castilla.  Canceled her colonoscopy.  Needs to reschedule.

## 2021-02-28 NOTE — Assessment & Plan Note (Signed)
Blood pressure as outlined.  Continue amlodipine and metoprolol.  hve discussed blood pressure guidelines.  Continue follow pressure.  Follow metabolic panel.

## 2021-02-28 NOTE — Assessment & Plan Note (Signed)
Changed jobs.  Appears to be handling things relatively well.  Follow.

## 2021-03-01 ENCOUNTER — Telehealth: Payer: Self-pay | Admitting: Internal Medicine

## 2021-03-01 DIAGNOSIS — E876 Hypokalemia: Secondary | ICD-10-CM

## 2021-03-01 NOTE — Telephone Encounter (Signed)
Patient has a lab appt 03/04/2021, there no orders in.

## 2021-03-02 NOTE — Telephone Encounter (Signed)
Orders placed.

## 2021-03-02 NOTE — Addendum Note (Signed)
Addended by: Lars Masson on: 03/02/2021 02:54 PM   Modules accepted: Orders

## 2021-03-02 NOTE — Telephone Encounter (Signed)
Please order labs.  Thanks.

## 2021-03-05 ENCOUNTER — Other Ambulatory Visit (INDEPENDENT_AMBULATORY_CARE_PROVIDER_SITE_OTHER): Payer: BC Managed Care – PPO

## 2021-03-05 ENCOUNTER — Other Ambulatory Visit: Payer: Self-pay

## 2021-03-05 ENCOUNTER — Encounter: Payer: Self-pay | Admitting: Internal Medicine

## 2021-03-05 DIAGNOSIS — E876 Hypokalemia: Secondary | ICD-10-CM | POA: Diagnosis not present

## 2021-03-05 LAB — POTASSIUM: Potassium: 3.5 mEq/L (ref 3.5–5.1)

## 2021-03-08 LAB — HM MAMMOGRAPHY

## 2021-03-11 LAB — HM PAP SMEAR

## 2021-05-07 ENCOUNTER — Other Ambulatory Visit: Payer: Self-pay | Admitting: Internal Medicine

## 2021-06-21 ENCOUNTER — Encounter: Payer: Self-pay | Admitting: Internal Medicine

## 2021-06-21 ENCOUNTER — Ambulatory Visit: Payer: BC Managed Care – PPO | Admitting: Internal Medicine

## 2021-06-21 ENCOUNTER — Other Ambulatory Visit: Payer: Self-pay

## 2021-06-21 VITALS — BP 128/74 | HR 67 | Temp 97.9°F | Resp 16 | Ht 64.0 in | Wt 139.2 lb

## 2021-06-21 DIAGNOSIS — Z1211 Encounter for screening for malignant neoplasm of colon: Secondary | ICD-10-CM

## 2021-06-21 DIAGNOSIS — E041 Nontoxic single thyroid nodule: Secondary | ICD-10-CM

## 2021-06-21 DIAGNOSIS — F439 Reaction to severe stress, unspecified: Secondary | ICD-10-CM

## 2021-06-21 DIAGNOSIS — I10 Essential (primary) hypertension: Secondary | ICD-10-CM

## 2021-06-21 LAB — BASIC METABOLIC PANEL
BUN: 13 mg/dL (ref 6–23)
CO2: 34 mEq/L — ABNORMAL HIGH (ref 19–32)
Calcium: 9 mg/dL (ref 8.4–10.5)
Chloride: 104 mEq/L (ref 96–112)
Creatinine, Ser: 0.87 mg/dL (ref 0.40–1.20)
GFR: 75.78 mL/min (ref >60.00)
Glucose, Bld: 86 mg/dL (ref 70–99)
Potassium: 3.6 mEq/L (ref 3.5–5.1)
Sodium: 140 mEq/L (ref 135–145)

## 2021-06-21 NOTE — Assessment & Plan Note (Signed)
Saw Dr Bary Castilla.  Canceled her colonoscopy.  Needs to reschedule.

## 2021-06-21 NOTE — Assessment & Plan Note (Signed)
Overall appears to be handling things relatively well.  Follow.   

## 2021-06-21 NOTE — Assessment & Plan Note (Signed)
Blood pressure as outlined.  Continue amlodipine and metoprolol.  Continue to follow pressure.  Follow metabolic panel.

## 2021-06-21 NOTE — Progress Notes (Signed)
Patient ID: Erika Daugherty, female   DOB: 01-Nov-1965, 56 y.o.   MRN: [id number]   Subjective:    Patient ID: Erika Daugherty, female    DOB: Dec 18, 1965, 56 y.o.   MRN: [id number]  This visit occurred during the SARS-CoV-2 public health emergency.  Safety protocols were in place, including screening questions prior to the visit, additional usage of staff PPE, and extensive cleaning of exam room while observing appropriate contact time as indicated for disinfecting solutions.   Patient here for a scheduled follow up.   Chief Complaint  Patient presents with   Hypertension   .   HPI Had covid recently.  Reports feels back to normal.  No residual problems.  No increased cough or congestion.  Post nasal drainage - is "normal" for her.  Uses nasacort.  No chest pain or sob reported.  No abdominal pain or bowel change reported.  Handling stress.  Agreeable to colonoscopy.  Will f/u on scheduling.     Past Medical History:  Diagnosis Date   Allergy    GERD (gastroesophageal reflux disease)    Heart murmur    Past Surgical History:  Procedure Laterality Date   VAGINAL DELIVERY     2   WISDOM TOOTH EXTRACTION     Family History  Problem Relation Age of Onset   Hypertension Mother    Hyperlipidemia Mother        triglycerides   Cancer Father        bile duct   Heart disease Father 77   Social History   Socioeconomic History   Marital status: Married    Spouse name: Not on file   Number of children: Not on file   Years of education: Not on file   Highest education level: Not on file  Occupational History   Not on file  Tobacco Use   Smoking status: Never   Smokeless tobacco: Never  Vaping Use   Vaping Use: Never used  Substance and Sexual Activity   Alcohol use: No    Comment: rare   Drug use: No   Sexual activity: Not on file  Other Topics Concern   Not on file  Social History Narrative   Lives in [place], [place] with husband and two children (16YO, 11YO) and dog.       Work - Arboriculturist   Diet - relative healthy   Exercise - walk or elliptical 70min 5 days per week   Social Determinants of Health   Financial Resource Strain: Not on file  Food Insecurity: Not on file  Transportation Needs: Not on file  Physical Activity: Not on file  Stress: Not on file  Social Connections: Not on file     Review of Systems  Constitutional:  Negative for appetite change and unexpected weight change.  HENT:  Positive for postnasal drip. Negative for congestion and sinus pressure.   Respiratory:  Negative for cough, chest tightness and shortness of breath.   Cardiovascular:  Negative for chest pain, palpitations and leg swelling.  Gastrointestinal:  Negative for abdominal pain, diarrhea, nausea and vomiting.  Genitourinary:  Negative for difficulty urinating and dysuria.  Musculoskeletal:  Negative for joint swelling and myalgias.  Skin:  Negative for color change and rash.  Neurological:  Negative for dizziness, light-headedness and headaches.  Psychiatric/Behavioral:  Negative for agitation and dysphoric mood.       Objective:     BP 128/74    Pulse 67    Temp 97.9  F (36.6 C)    Resp 16    Ht 5\' 4"  (1.626 m)    Wt 139 lb 3.2 oz (63.1 kg)    LMP 01/18/2018    SpO2 98%    BMI 23.89 kg/m  Wt Readings from Last 3 Encounters:  06/21/21 139 lb 3.2 oz (63.1 kg)  02/18/21 142 lb (64.4 kg)  10/15/20 142 lb (64.4 kg)    Physical Exam Vitals reviewed.  Constitutional:      General: She is not in acute distress.    Appearance: Normal appearance.  HENT:     Head: Normocephalic and atraumatic.     Right Ear: External ear normal.     Left Ear: External ear normal.  Eyes:     General: No scleral icterus.       Right eye: No discharge.        Left eye: No discharge.     Conjunctiva/sclera: Conjunctivae normal.  Neck:     Thyroid: No thyromegaly.  Cardiovascular:     Rate and Rhythm: Normal rate and regular rhythm.  Pulmonary:     Effort: No  respiratory distress.     Breath sounds: Normal breath sounds. No wheezing.  Abdominal:     General: Bowel sounds are normal.     Palpations: Abdomen is soft.     Tenderness: There is no abdominal tenderness.  Musculoskeletal:        General: No swelling or tenderness.     Cervical back: Neck supple. No tenderness.  Lymphadenopathy:     Cervical: No cervical adenopathy.  Skin:    Findings: No erythema or rash.  Neurological:     Mental Status: She is alert.  Psychiatric:        Mood and Affect: Mood normal.        Behavior: Behavior normal.     Outpatient Encounter Medications as of 06/21/2021  Medication Sig   amLODipine (NORVASC) 5 MG tablet TAKE 1 TABLET BY MOUTH EVERY DAY   bifidobacterium infantis (ALIGN) capsule Take 1 capsule by mouth daily.   metoprolol succinate (TOPROL-XL) 25 MG 24 hr tablet TAKE 1 TABLET BY MOUTH EVERY DAY   omeprazole (PRILOSEC OTC) 20 MG tablet Take by mouth.   triamcinolone (NASACORT) 55 MCG/ACT AERO nasal inhaler Place 2 sprays into the nose daily.   No facility-administered encounter medications on file as of 06/21/2021.     Lab Results  Component Value Date   WBC 4.5 02/18/2021   HGB 13.6 02/18/2021   HCT 40.2 02/18/2021   PLT 227.0 02/18/2021   GLUCOSE 86 06/21/2021   CHOL 148 02/18/2021   TRIG 88.0 02/18/2021   HDL 49.80 02/18/2021   LDLCALC 81 02/18/2021   ALT 28 02/18/2021   AST 26 02/18/2021   NA 140 06/21/2021   K 3.6 06/21/2021   CL 104 06/21/2021   CREATININE 0.87 06/21/2021   BUN 13 06/21/2021   CO2 34 (H) 06/21/2021   TSH 3.13 02/18/2021   HGBA1C 5.3 12/14/2015   MICROALBUR 0.8 11/05/2013       Assessment & Plan:   Problem List Items Addressed This Visit     Benign essential HTN - Primary    Blood pressure as outlined.  Continue amlodipine and metoprolol.  Continue to follow pressure.  Follow metabolic panel.       Relevant Orders   Basic metabolic panel (Completed)   Colon cancer screening    Saw Dr  Bary Castilla.  Canceled her colonoscopy.  Needs to  reschedule.        Relevant Orders   Ambulatory referral to General Surgery   Stress    Overall appears to be handling things relatively well.  Follow.       Thyroid nodule    Continue f/u with endocrinology.         Einar Pheasant, MD

## 2021-06-21 NOTE — Assessment & Plan Note (Signed)
Continue f/u with endocrinology.

## 2021-07-12 ENCOUNTER — Encounter: Payer: Self-pay | Admitting: Internal Medicine

## 2021-07-20 DIAGNOSIS — S92323A Displaced fracture of second metatarsal bone, unspecified foot, initial encounter for closed fracture: Secondary | ICD-10-CM | POA: Insufficient documentation

## 2021-07-20 DIAGNOSIS — S92332A Displaced fracture of third metatarsal bone, left foot, initial encounter for closed fracture: Secondary | ICD-10-CM | POA: Insufficient documentation

## 2021-07-20 DIAGNOSIS — S93629A Sprain of tarsometatarsal ligament of unspecified foot, initial encounter: Secondary | ICD-10-CM | POA: Insufficient documentation

## 2021-07-30 ENCOUNTER — Encounter: Payer: Self-pay | Admitting: Internal Medicine

## 2021-07-30 DIAGNOSIS — S92309A Fracture of unspecified metatarsal bone(s), unspecified foot, initial encounter for closed fracture: Secondary | ICD-10-CM | POA: Insufficient documentation

## 2021-08-14 ENCOUNTER — Other Ambulatory Visit: Payer: Self-pay | Admitting: Internal Medicine

## 2021-10-05 DIAGNOSIS — R600 Localized edema: Secondary | ICD-10-CM | POA: Insufficient documentation

## 2021-10-05 DIAGNOSIS — S92333A Displaced fracture of third metatarsal bone, unspecified foot, initial encounter for closed fracture: Secondary | ICD-10-CM | POA: Insufficient documentation

## 2021-10-15 ENCOUNTER — Telehealth: Payer: BC Managed Care – PPO | Admitting: Physician Assistant

## 2021-10-15 DIAGNOSIS — R3989 Other symptoms and signs involving the genitourinary system: Secondary | ICD-10-CM | POA: Diagnosis not present

## 2021-10-15 MED ORDER — SULFAMETHOXAZOLE-TRIMETHOPRIM 800-160 MG PO TABS: 1.0000 | ORAL_TABLET | Freq: Two times a day (BID) | ORAL | 0 refills | Status: DC

## 2021-10-15 NOTE — Progress Notes (Signed)

## 2021-10-25 ENCOUNTER — Encounter: Payer: Self-pay | Admitting: Internal Medicine

## 2021-10-25 ENCOUNTER — Ambulatory Visit (INDEPENDENT_AMBULATORY_CARE_PROVIDER_SITE_OTHER): Payer: BC Managed Care – PPO

## 2021-10-25 ENCOUNTER — Ambulatory Visit: Payer: BC Managed Care – PPO | Admitting: Internal Medicine

## 2021-10-25 VITALS — BP 130/72 | HR 62 | Temp 98.3°F | Resp 16 | Ht 64.0 in | Wt 141.6 lb

## 2021-10-25 DIAGNOSIS — R0602 Shortness of breath: Secondary | ICD-10-CM

## 2021-10-25 DIAGNOSIS — E041 Nontoxic single thyroid nodule: Secondary | ICD-10-CM | POA: Diagnosis not present

## 2021-10-25 DIAGNOSIS — Z13220 Encounter for screening for lipoid disorders: Secondary | ICD-10-CM | POA: Diagnosis not present

## 2021-10-25 DIAGNOSIS — I10 Essential (primary) hypertension: Secondary | ICD-10-CM

## 2021-10-25 DIAGNOSIS — S92309D Fracture of unspecified metatarsal bone(s), unspecified foot, subsequent encounter for fracture with routine healing: Secondary | ICD-10-CM

## 2021-10-25 DIAGNOSIS — F439 Reaction to severe stress, unspecified: Secondary | ICD-10-CM

## 2021-10-25 DIAGNOSIS — Z1211 Encounter for screening for malignant neoplasm of colon: Secondary | ICD-10-CM

## 2021-10-25 LAB — CBC WITH DIFFERENTIAL/PLATELET
Basophils Absolute: 0 10*3/uL (ref 0.0–0.1)
Basophils Relative: 0.6 % (ref 0.0–3.0)
Eosinophils Absolute: 0.1 10*3/uL (ref 0.0–0.7)
Eosinophils Relative: 2.1 % (ref 0.0–5.0)
HCT: 37.5 % (ref 36.0–46.0)
Hemoglobin: 12.5 g/dL (ref 12.0–15.0)
Lymphocytes Relative: 39.4 % (ref 12.0–46.0)
Lymphs Abs: 1.4 10*3/uL (ref 0.7–4.0)
MCHC: 33.4 g/dL (ref 30.0–36.0)
MCV: 92.3 fl (ref 78.0–100.0)
Monocytes Absolute: 0.4 10*3/uL (ref 0.1–1.0)
Monocytes Relative: 11.6 % (ref 3.0–12.0)
Neutro Abs: 1.7 10*3/uL (ref 1.4–7.7)
Neutrophils Relative %: 46.3 % (ref 43.0–77.0)
Platelets: 231 10*3/uL (ref 150.0–400.0)
RBC: 4.06 Mil/uL (ref 3.87–5.11)
RDW: 13 % (ref 11.5–15.5)
WBC: 3.7 10*3/uL — ABNORMAL LOW (ref 4.0–10.5)

## 2021-10-25 LAB — BASIC METABOLIC PANEL
BUN: 12 mg/dL (ref 6–23)
CO2: 29 mEq/L (ref 19–32)
Calcium: 9.2 mg/dL (ref 8.4–10.5)
Chloride: 105 mEq/L (ref 96–112)
Creatinine, Ser: 0.82 mg/dL (ref 0.40–1.20)
GFR: 81.17 mL/min (ref >60.00)
Glucose, Bld: 89 mg/dL (ref 70–99)
Potassium: 3.7 mEq/L (ref 3.5–5.1)
Sodium: 142 mEq/L (ref 135–145)

## 2021-10-25 LAB — LIPID PANEL
Cholesterol: 138 mg/dL (ref 0–200)
HDL: 49.9 mg/dL (ref >39.00)
LDL Cholesterol: 71 mg/dL (ref 0–99)
NonHDL: 88.2
Total CHOL/HDL Ratio: 3
Triglycerides: 84 mg/dL (ref 0.0–149.0)
VLDL: 16.8 mg/dL (ref 0.0–40.0)

## 2021-10-25 LAB — HEPATIC FUNCTION PANEL
ALT: 18 U/L (ref 0–35)
AST: 20 U/L (ref 0–37)
Albumin: 4.2 g/dL (ref 3.5–5.2)
Alkaline Phosphatase: 97 U/L (ref 39–117)
Bilirubin, Direct: 0.1 mg/dL (ref 0.0–0.3)
Total Bilirubin: 0.6 mg/dL (ref 0.2–1.2)
Total Protein: 7.7 g/dL (ref 6.0–8.3)

## 2021-10-25 LAB — TSH: TSH: 4.57 u[IU]/mL (ref 0.35–5.50)

## 2021-10-25 MED ORDER — METOPROLOL SUCCINATE ER 25 MG PO TB24: 25.0000 mg | ORAL_TABLET | Freq: Every day | ORAL | 1 refills | Status: DC

## 2021-10-26 ENCOUNTER — Telehealth: Payer: Self-pay | Admitting: Internal Medicine

## 2021-10-26 ENCOUNTER — Encounter: Payer: Self-pay | Admitting: Internal Medicine

## 2021-10-26 ENCOUNTER — Other Ambulatory Visit: Payer: Self-pay

## 2021-10-26 DIAGNOSIS — R899 Unspecified abnormal finding in specimens from other organs, systems and tissues: Secondary | ICD-10-CM

## 2021-10-26 DIAGNOSIS — R0602 Shortness of breath: Secondary | ICD-10-CM | POA: Insufficient documentation

## 2021-10-26 NOTE — Telephone Encounter (Signed)
Saw Erika Daugherty Erika Daugherty mebane

## 2021-11-11 NOTE — Telephone Encounter (Signed)
Attempted to try to schedule appt for pt twice - neither date given worked for pt. LM for pt to cb to give info for [facility] so she can make an appointment that better suits her schedule.

## 2021-11-29 ENCOUNTER — Encounter: Payer: Self-pay | Admitting: Internal Medicine

## 2021-12-06 ENCOUNTER — Other Ambulatory Visit (INDEPENDENT_AMBULATORY_CARE_PROVIDER_SITE_OTHER): Payer: BC Managed Care – PPO

## 2021-12-06 DIAGNOSIS — R899 Unspecified abnormal finding in specimens from other organs, systems and tissues: Secondary | ICD-10-CM

## 2021-12-06 LAB — CBC WITH DIFFERENTIAL/PLATELET
Basophils Absolute: 0 10*3/uL (ref 0.0–0.1)
Basophils Relative: 0.5 % (ref 0.0–3.0)
Eosinophils Absolute: 0.1 10*3/uL (ref 0.0–0.7)
Eosinophils Relative: 2.4 % (ref 0.0–5.0)
HCT: 37.3 % (ref 36.0–46.0)
Hemoglobin: 12.8 g/dL (ref 12.0–15.0)
Lymphocytes Relative: 43.7 % (ref 12.0–46.0)
Lymphs Abs: 1.6 10*3/uL (ref 0.7–4.0)
MCHC: 34.5 g/dL (ref 30.0–36.0)
MCV: 89.7 fl (ref 78.0–100.0)
Monocytes Absolute: 0.4 10*3/uL (ref 0.1–1.0)
Monocytes Relative: 11.1 % (ref 3.0–12.0)
Neutro Abs: 1.6 10*3/uL (ref 1.4–7.7)
Neutrophils Relative %: 42.3 % — ABNORMAL LOW (ref 43.0–77.0)
Platelets: 220 10*3/uL (ref 150.0–400.0)
RBC: 4.15 Mil/uL (ref 3.87–5.11)
RDW: 13.2 % (ref 11.5–15.5)
WBC: 3.7 10*3/uL — ABNORMAL LOW (ref 4.0–10.5)

## 2022-01-25 ENCOUNTER — Ambulatory Visit: Payer: BC Managed Care – PPO | Admitting: Internal Medicine

## 2022-01-25 ENCOUNTER — Encounter: Payer: Self-pay | Admitting: Internal Medicine

## 2022-01-25 VITALS — BP 122/80 | HR 65 | Temp 97.6°F | Resp 17 | Ht 64.0 in | Wt 138.4 lb

## 2022-01-25 DIAGNOSIS — Z1211 Encounter for screening for malignant neoplasm of colon: Secondary | ICD-10-CM

## 2022-01-25 DIAGNOSIS — Z23 Encounter for immunization: Secondary | ICD-10-CM

## 2022-01-25 DIAGNOSIS — I10 Essential (primary) hypertension: Secondary | ICD-10-CM

## 2022-01-25 DIAGNOSIS — E041 Nontoxic single thyroid nodule: Secondary | ICD-10-CM

## 2022-01-25 DIAGNOSIS — S92309D Fracture of unspecified metatarsal bone(s), unspecified foot, subsequent encounter for fracture with routine healing: Secondary | ICD-10-CM | POA: Diagnosis not present

## 2022-01-25 DIAGNOSIS — F439 Reaction to severe stress, unspecified: Secondary | ICD-10-CM

## 2022-01-25 MED ORDER — AMOXICILLIN-POT CLAVULANATE 875-125 MG PO TABS: 1.0000 | ORAL_TABLET | Freq: Two times a day (BID) | ORAL | 0 refills | Status: DC

## 2022-01-25 NOTE — Progress Notes (Signed)
Patient ID: Erika Daugherty, female   DOB: 14-Jul-1965, 56 y.o.   MRN: [id number]   Subjective:    Patient ID: Erika Daugherty, female    DOB: [birth year], 56 y.o.   MRN: [id number]  Patient here for  Chief Complaint  Patient presents with   Follow-up    3 month follow up   .   HPI Here to follow up regarding her blood pressure.  Saw Dr Gabriel Carina 12/20/21 - f/u thyroid nodule.  Recommended f/u thyroid ultrasound.  Scheduled for 02/01/22/ Recent foot fracture.  Was seeing ortho.  Doing better now.  No chest pain.  Breathing stable.  No abdominal pain or bowel change reported.  Increased stress .  Discussed.  Overall appears to be handing things relatively well.    Past Medical History:  Diagnosis Date   Allergy    GERD (gastroesophageal reflux disease)    Heart murmur    Past Surgical History:  Procedure Laterality Date   VAGINAL DELIVERY     2   WISDOM TOOTH EXTRACTION     Family History  Problem Relation Age of Onset   Hypertension Mother    Hyperlipidemia Mother        triglycerides   Cancer Father        bile duct   Heart disease Father 30   Social History   Socioeconomic History   Marital status: Married    Spouse name: Not on file   Number of children: Not on file   Years of education: Not on file   Highest education level: Not on file  Occupational History   Not on file  Tobacco Use   Smoking status: Never   Smokeless tobacco: Never  Vaping Use   Vaping Use: Never used  Substance and Sexual Activity   Alcohol use: No    Comment: rare   Drug use: No   Sexual activity: Not on file  Other Topics Concern   Not on file  Social History Narrative   Lives in [place], [place] with husband and two children (16YO, 11YO) and dog.      Work - Arboriculturist   Diet - relative healthy   Exercise - walk or elliptical 27mn 5 days per week   Social Determinants of Health   Financial Resource Strain: Not on file  Food Insecurity: Not on file  Transportation Needs: Not  on file  Physical Activity: Not on file  Stress: Not on file  Social Connections: Not on file     Review of Systems  Constitutional:  Negative for appetite change and unexpected weight change.  HENT:  Negative for congestion and sinus pressure.   Respiratory:  Negative for cough, chest tightness and shortness of breath.   Cardiovascular:  Negative for chest pain, palpitations and leg swelling.  Gastrointestinal:  Negative for abdominal pain, diarrhea, nausea and vomiting.  Genitourinary:  Negative for difficulty urinating and dysuria.  Musculoskeletal:  Negative for joint swelling and myalgias.  Skin:  Negative for color change and rash.  Neurological:  Negative for dizziness, light-headedness and headaches.  Psychiatric/Behavioral:  Negative for agitation and dysphoric mood.        Objective:     BP 122/80   Pulse 65   Temp 97.6 F (36.4 C) (Oral)   Resp 17   Ht '5\' 4"'$  (1.626 m)   Wt 138 lb 6 oz (62.8 kg)   LMP 01/18/2018   SpO2 99%   BMI 23.75 kg/m  Wt Readings  from Last 3 Encounters:  01/25/22 138 lb 6 oz (62.8 kg)  10/25/21 141 lb 9.6 oz (64.2 kg)  06/21/21 139 lb 3.2 oz (63.1 kg)    Physical Exam Vitals reviewed.  Constitutional:      General: She is not in acute distress.    Appearance: Normal appearance.  HENT:     Head: Normocephalic and atraumatic.     Right Ear: External ear normal.     Left Ear: External ear normal.  Eyes:     General: No scleral icterus.       Right eye: No discharge.        Left eye: No discharge.     Conjunctiva/sclera: Conjunctivae normal.  Neck:     Thyroid: No thyromegaly.  Cardiovascular:     Rate and Rhythm: Normal rate and regular rhythm.  Pulmonary:     Effort: No respiratory distress.     Breath sounds: Normal breath sounds. No wheezing.  Abdominal:     General: Bowel sounds are normal.     Palpations: Abdomen is soft.     Tenderness: There is no abdominal tenderness.  Musculoskeletal:        General: No swelling  or tenderness.     Cervical back: Neck supple. No tenderness.  Lymphadenopathy:     Cervical: No cervical adenopathy.  Skin:    Findings: No erythema or rash.  Neurological:     Mental Status: She is alert.  Psychiatric:        Mood and Affect: Mood normal.        Behavior: Behavior normal.      Outpatient Encounter Medications as of 01/25/2022  Medication Sig   amLODipine (NORVASC) 5 MG tablet TAKE 1 TABLET BY MOUTH EVERY DAY   amoxicillin-clavulanate (AUGMENTIN) 875-125 MG tablet Take 1 tablet by mouth 2 (two) times daily.   bifidobacterium infantis (ALIGN) capsule Take 1 capsule by mouth daily.   metoprolol succinate (TOPROL-XL) 25 MG 24 hr tablet Take 1 tablet (25 mg total) by mouth daily.   omeprazole (PRILOSEC OTC) 20 MG tablet Take by mouth.   triamcinolone (NASACORT) 55 MCG/ACT AERO nasal inhaler Place 2 sprays into the nose daily.   No facility-administered encounter medications on file as of 01/25/2022.     Lab Results  Component Value Date   WBC 3.7 (L) 12/06/2021   HGB 12.8 12/06/2021   HCT 37.3 12/06/2021   PLT 220.0 12/06/2021   GLUCOSE 89 10/25/2021   CHOL 138 10/25/2021   TRIG 84.0 10/25/2021   HDL 49.90 10/25/2021   LDLCALC 71 10/25/2021   ALT 18 10/25/2021   AST 20 10/25/2021   NA 142 10/25/2021   K 3.7 10/25/2021   CL 105 10/25/2021   CREATININE 0.82 10/25/2021   BUN 12 10/25/2021   CO2 29 10/25/2021   TSH 4.57 10/25/2021   HGBA1C 5.3 12/14/2015   MICROALBUR 0.8 11/05/2013       Assessment & Plan:   Problem List Items Addressed This Visit     Benign essential HTN - Primary    Blood pressure as outlined.  Have her spot check her pressure.  Send in readings.  Follow pressure.  Follow metabolic panel.       Colon cancer screening    Previously saw Dr Bary Castilla.  Confirm f/u.       Metatarsal fracture    Evaluated by EMERGE.  Doing better.       Stress    Discussed.  Appears to be  handling things relatively well.  Follow       Thyroid  nodule    Saw endocrinology 10/2020.  Recommended f/u ultrasound in 1 year.  Scheduled for 02/01/22.        Other Visit Diagnoses     Need for immunization against influenza       Relevant Orders   Flu Vaccine QUAD 6+ mos PF IM (Fluarix Quad PF) (Completed)        Einar Pheasant, MD

## 2022-01-30 ENCOUNTER — Encounter: Payer: Self-pay | Admitting: Internal Medicine

## 2022-01-30 NOTE — Assessment & Plan Note (Signed)
Previously saw Dr Bary Castilla.  Confirm f/u.

## 2022-01-30 NOTE — Assessment & Plan Note (Signed)
Discussed.  Appears to be handling things relatively well.  Follow  

## 2022-01-30 NOTE — Assessment & Plan Note (Signed)
Evaluated by EMERGE.  Doing better.

## 2022-01-30 NOTE — Assessment & Plan Note (Signed)
Saw endocrinology 10/2020.  Recommended f/u ultrasound in 1 year.  Scheduled for 02/01/22.

## 2022-01-30 NOTE — Assessment & Plan Note (Signed)
Blood pressure as outlined.  Have her spot check her pressure.  Send in readings.  Follow pressure.  Follow metabolic panel.

## 2022-02-03 ENCOUNTER — Encounter: Payer: Self-pay | Admitting: Internal Medicine

## 2022-02-11 ENCOUNTER — Encounter: Payer: Self-pay | Admitting: Internal Medicine

## 2022-02-15 ENCOUNTER — Other Ambulatory Visit: Payer: Self-pay | Admitting: Internal Medicine

## 2022-03-11 LAB — HM MAMMOGRAPHY

## 2022-03-14 ENCOUNTER — Encounter: Payer: Self-pay | Admitting: Internal Medicine

## 2022-03-14 ENCOUNTER — Other Ambulatory Visit: Payer: Self-pay

## 2022-03-14 DIAGNOSIS — Z1211 Encounter for screening for malignant neoplasm of colon: Secondary | ICD-10-CM

## 2022-03-18 ENCOUNTER — Other Ambulatory Visit: Payer: Self-pay

## 2022-04-18 ENCOUNTER — Other Ambulatory Visit: Payer: Self-pay | Admitting: Internal Medicine

## 2022-04-18 ENCOUNTER — Encounter: Payer: Self-pay | Admitting: Internal Medicine

## 2022-05-03 ENCOUNTER — Encounter: Payer: Self-pay | Admitting: Internal Medicine

## 2022-05-03 DIAGNOSIS — Z0000 Encounter for general adult medical examination without abnormal findings: Secondary | ICD-10-CM | POA: Insufficient documentation

## 2022-05-17 ENCOUNTER — Encounter: Payer: Self-pay | Admitting: Internal Medicine

## 2022-05-17 ENCOUNTER — Ambulatory Visit: Payer: BC Managed Care – PPO | Admitting: Internal Medicine

## 2022-05-17 VITALS — BP 138/82 | HR 65 | Temp 97.8°F | Ht 64.0 in | Wt 138.0 lb

## 2022-05-17 DIAGNOSIS — Z1231 Encounter for screening mammogram for malignant neoplasm of breast: Secondary | ICD-10-CM

## 2022-05-17 DIAGNOSIS — Z1211 Encounter for screening for malignant neoplasm of colon: Secondary | ICD-10-CM

## 2022-05-17 DIAGNOSIS — I10 Essential (primary) hypertension: Secondary | ICD-10-CM

## 2022-05-17 DIAGNOSIS — E041 Nontoxic single thyroid nodule: Secondary | ICD-10-CM

## 2022-05-17 DIAGNOSIS — Z1239 Encounter for other screening for malignant neoplasm of breast: Secondary | ICD-10-CM | POA: Insufficient documentation

## 2022-05-17 DIAGNOSIS — F439 Reaction to severe stress, unspecified: Secondary | ICD-10-CM

## 2022-05-17 LAB — BASIC METABOLIC PANEL
BUN: 12 mg/dL (ref 6–23)
CO2: 31 mEq/L (ref 19–32)
Calcium: 8.9 mg/dL (ref 8.4–10.5)
Chloride: 104 mEq/L (ref 96–112)
Creatinine, Ser: 0.91 mg/dL (ref 0.40–1.20)
GFR: 71.35 mL/min (ref >60.00)
Glucose, Bld: 90 mg/dL (ref 70–99)
Potassium: 3.3 mEq/L — ABNORMAL LOW (ref 3.5–5.1)
Sodium: 142 mEq/L (ref 135–145)

## 2022-05-17 MED ORDER — METOPROLOL SUCCINATE ER 25 MG PO TB24: 25.0000 mg | ORAL_TABLET | Freq: Every day | ORAL | 1 refills | Status: DC

## 2022-05-17 MED ORDER — AMLODIPINE BESYLATE 5 MG PO TABS: 5.0000 mg | ORAL_TABLET | Freq: Every day | ORAL | 1 refills | Status: DC

## 2022-05-17 MED ORDER — LORAZEPAM 0.5 MG PO TABS: ORAL_TABLET | ORAL | 0 refills | Status: DC

## 2022-05-17 NOTE — Assessment & Plan Note (Signed)
Mammogram - 03/11/22 - Birads I.

## 2022-05-17 NOTE — Assessment & Plan Note (Signed)
Discussed.  Appears to be handling things relatively well.  Follow

## 2022-05-17 NOTE — Assessment & Plan Note (Addendum)
Saw endocrinology 10/2020.  Recommended f/u ultrasound in 1 year.  Scheduled for 02/01/22.  F/u thyroid ultrasound 01/2022 - stable.

## 2022-05-17 NOTE — Assessment & Plan Note (Addendum)
Blood pressure as outlined.  Continues on metoprolol and amlodipine. Have her spot check her pressure.  Send in readings.  Follow pressure.  Follow metabolic panel.

## 2022-05-17 NOTE — Progress Notes (Signed)
Patient ID: Erika Daugherty, female   DOB: 07/02/1965, 57 y.o.   MRN: [id number]   Subjective:    Patient ID: Erika Daugherty, female    DOB: 02-06-1966, 57 y.o.   MRN: [id number]  Patient here for  Chief Complaint  Patient presents with   Medical Management of Chronic Issues   Hypertension   .   HPI Here to follow up regarding her blood pressure.  Saw Dr Gabriel Carina 12/20/21 - f/u thyroid nodule.  Recommended f/u thyroid ultrasound.  Had ultrasound 02/01/22 - stable. S/p foot fracture.  Was seeing ortho.  Doing better now.  Has noticed some left hip pain. Notices more after sitting for long periods of time or after sleeping or riding in car.  No chest pain.  Breathing stable.  No abdominal pain or bowel change reported.  Increased stress .  Discussed.  Overall appears to be handing things relatively well.  Is flying in March.  Request something to have if needed for anxiety.  Discussed colonoscopy.     Past Medical History:  Diagnosis Date   Allergy    GERD (gastroesophageal reflux disease)    Heart murmur    Past Surgical History:  Procedure Laterality Date   VAGINAL DELIVERY     2   WISDOM TOOTH EXTRACTION     Family History  Problem Relation Age of Onset   Hypertension Mother    Hyperlipidemia Mother        triglycerides   Cancer Father        bile duct   Heart disease Father 55   Social History   Socioeconomic History   Marital status: Married    Spouse name: Not on file   Number of children: Not on file   Years of education: Not on file   Highest education level: Not on file  Occupational History   Not on file  Tobacco Use   Smoking status: Never   Smokeless tobacco: Never  Vaping Use   Vaping Use: Never used  Substance and Sexual Activity   Alcohol use: No    Comment: rare   Drug use: No   Sexual activity: Not on file  Other Topics Concern   Not on file  Social History Narrative   Lives in [place], [place] with husband and two children (16YO, 11YO) and dog.       Work - Arboriculturist   Diet - relative healthy   Exercise - walk or elliptical 53mn 5 days per week   Social Determinants of Health   Financial Resource Strain: Not on file  Food Insecurity: Not on file  Transportation Needs: Not on file  Physical Activity: Not on file  Stress: Not on file  Social Connections: Not on file     Review of Systems  Constitutional:  Negative for appetite change and unexpected weight change.  HENT:  Negative for congestion and sinus pressure.   Respiratory:  Negative for cough, chest tightness and shortness of breath.   Cardiovascular:  Negative for chest pain, palpitations and leg swelling.  Gastrointestinal:  Negative for abdominal pain, diarrhea, nausea and vomiting.  Genitourinary:  Negative for difficulty urinating and dysuria.  Musculoskeletal:  Negative for joint swelling and myalgias.       Left hip pain as outlined. Intermittent.   Skin:  Negative for color change and rash.  Neurological:  Negative for dizziness, light-headedness and headaches.  Psychiatric/Behavioral:  Negative for agitation and dysphoric mood.  Objective:     BP 138/82   Pulse 65   Temp 97.8 F (36.6 C) (Temporal)   Ht '5\' 4"'$  (1.626 m)   Wt 138 lb (62.6 kg)   LMP 01/18/2018   SpO2 98%   BMI 23.69 kg/m  Wt Readings from Last 3 Encounters:  05/17/22 138 lb (62.6 kg)  01/25/22 138 lb 6 oz (62.8 kg)  10/25/21 141 lb 9.6 oz (64.2 kg)    Physical Exam Vitals reviewed.  Constitutional:      General: She is not in acute distress.    Appearance: Normal appearance.  HENT:     Head: Normocephalic and atraumatic.     Right Ear: External ear normal.     Left Ear: External ear normal.  Eyes:     General: No scleral icterus.       Right eye: No discharge.        Left eye: No discharge.     Conjunctiva/sclera: Conjunctivae normal.  Neck:     Thyroid: No thyromegaly.  Cardiovascular:     Rate and Rhythm: Normal rate and regular rhythm.  Pulmonary:      Effort: No respiratory distress.     Breath sounds: Normal breath sounds. No wheezing.  Abdominal:     General: Bowel sounds are normal.     Palpations: Abdomen is soft.     Tenderness: There is no abdominal tenderness.  Musculoskeletal:        General: No swelling or tenderness.     Cervical back: Neck supple. No tenderness.     Comments: No pain with adduction or abduction of lower extremities.  Good rom.   Lymphadenopathy:     Cervical: No cervical adenopathy.  Skin:    Findings: No erythema or rash.  Neurological:     Mental Status: She is alert.  Psychiatric:        Mood and Affect: Mood normal.        Behavior: Behavior normal.      Outpatient Encounter Medications as of 05/17/2022  Medication Sig   bifidobacterium infantis (ALIGN) capsule Take 1 capsule by mouth daily.   LORazepam (ATIVAN) 0.5 MG tablet 1/2 tablet q day prn   omeprazole (PRILOSEC OTC) 20 MG tablet Take by mouth.   triamcinolone (NASACORT) 55 MCG/ACT AERO nasal inhaler Place 2 sprays into the nose daily.   amLODipine (NORVASC) 5 MG tablet Take 1 tablet (5 mg total) by mouth daily.   metoprolol succinate (TOPROL-XL) 25 MG 24 hr tablet Take 1 tablet (25 mg total) by mouth daily.   [DISCONTINUED] amLODipine (NORVASC) 5 MG tablet TAKE 1 TABLET BY MOUTH EVERY DAY   [DISCONTINUED] amoxicillin-clavulanate (AUGMENTIN) 875-125 MG tablet Take 1 tablet by mouth 2 (two) times daily.   [DISCONTINUED] metoprolol succinate (TOPROL-XL) 25 MG 24 hr tablet TAKE 1 TABLET (25 MG TOTAL) BY MOUTH DAILY.   No facility-administered encounter medications on file as of 05/17/2022.     Lab Results  Component Value Date   WBC 3.7 (L) 12/06/2021   HGB 12.8 12/06/2021   HCT 37.3 12/06/2021   PLT 220.0 12/06/2021   GLUCOSE 89 10/25/2021   CHOL 138 10/25/2021   TRIG 84.0 10/25/2021   HDL 49.90 10/25/2021   LDLCALC 71 10/25/2021   ALT 18 10/25/2021   AST 20 10/25/2021   NA 142 10/25/2021   K 3.7 10/25/2021   CL 105  10/25/2021   CREATININE 0.82 10/25/2021   BUN 12 10/25/2021   CO2 29 10/25/2021  TSH 4.57 10/25/2021   HGBA1C 5.3 12/14/2015   MICROALBUR 0.8 11/05/2013       Assessment & Plan:   Problem List Items Addressed This Visit     Benign essential HTN - Primary    Blood pressure as outlined.  Continues on metoprolol and amlodipine. Have her spot check her pressure.  Send in readings.  Follow pressure.  Follow metabolic panel.       Relevant Medications   amLODipine (NORVASC) 5 MG tablet   metoprolol succinate (TOPROL-XL) 25 MG 24 hr tablet   Other Relevant Orders   Basic metabolic panel   Breast cancer screening    Mammogram - 03/11/22 - Birads I.       Colon cancer screening    Discussed colonoscopy.  Will notify when agreeable.        Stress    Discussed.  Appears to be handling things relatively well.  Follow       Thyroid nodule    Saw endocrinology 10/2020.  Recommended f/u ultrasound in 1 year.  Scheduled for 02/01/22.  F/u thyroid ultrasound 01/2022 - stable.       Relevant Medications   metoprolol succinate (TOPROL-XL) 25 MG 24 hr tablet    Einar Pheasant, MD

## 2022-05-17 NOTE — Assessment & Plan Note (Signed)
Discussed colonoscopy.  Will notify when agreeable.

## 2022-05-18 ENCOUNTER — Other Ambulatory Visit: Payer: Self-pay

## 2022-05-18 ENCOUNTER — Telehealth: Payer: Self-pay

## 2022-05-18 DIAGNOSIS — E876 Hypokalemia: Secondary | ICD-10-CM

## 2022-05-18 NOTE — Telephone Encounter (Signed)
Mychart msg sent to pt at pt request with lab results and isntructions

## 2022-05-27 ENCOUNTER — Other Ambulatory Visit (INDEPENDENT_AMBULATORY_CARE_PROVIDER_SITE_OTHER): Payer: BC Managed Care – PPO

## 2022-05-27 DIAGNOSIS — E876 Hypokalemia: Secondary | ICD-10-CM

## 2022-05-27 LAB — BASIC METABOLIC PANEL
BUN: 14 mg/dL (ref 6–23)
CO2: 32 mEq/L (ref 19–32)
Calcium: 9.1 mg/dL (ref 8.4–10.5)
Chloride: 104 mEq/L (ref 96–112)
Creatinine, Ser: 0.83 mg/dL (ref 0.40–1.20)
GFR: 79.66 mL/min (ref >60.00)
Glucose, Bld: 94 mg/dL (ref 70–99)
Potassium: 3.7 mEq/L (ref 3.5–5.1)
Sodium: 142 mEq/L (ref 135–145)

## 2022-06-15 ENCOUNTER — Other Ambulatory Visit: Payer: Self-pay

## 2022-06-15 ENCOUNTER — Encounter: Payer: Self-pay | Admitting: Internal Medicine

## 2022-06-15 DIAGNOSIS — Z1211 Encounter for screening for malignant neoplasm of colon: Secondary | ICD-10-CM

## 2022-06-17 ENCOUNTER — Telehealth: Payer: Self-pay

## 2022-06-17 NOTE — Telephone Encounter (Signed)
Patient states [facility] states they have not received a referral for her to have a colonoscopy.

## 2022-06-20 ENCOUNTER — Encounter: Payer: Self-pay | Admitting: Internal Medicine

## 2022-06-20 NOTE — Progress Notes (Unsigned)
  Tomasita Morrow, NP-C Phone: [phone]  Erika Daugherty is a 57 y.o. female who presents today for hypertension.  HYPERTENSION Disease Monitoring Home BP Monitoring *** Chest pain- ***    Dyspnea- *** Medications Compliance-  ***. Lightheadedness-  ***  Edema- *** BMET    Component Value Date/Time   NA 142 05/27/2022 0731   K 3.7 05/27/2022 0731   CL 104 05/27/2022 0731   CO2 32 05/27/2022 0731   GLUCOSE 94 05/27/2022 0731   BUN 14 05/27/2022 0731   CREATININE 0.83 05/27/2022 0731   CALCIUM 9.1 05/27/2022 0731     Social History   Tobacco Use  Smoking Status Never  Smokeless Tobacco Never    Current Outpatient Medications on File Prior to Visit  Medication Sig Dispense Refill   amLODipine (NORVASC) 5 MG tablet Take 1 tablet (5 mg total) by mouth daily. 90 tablet 1   bifidobacterium infantis (ALIGN) capsule Take 1 capsule by mouth daily.     LORazepam (ATIVAN) 0.5 MG tablet 1/2 tablet q day prn 10 tablet 0   metoprolol succinate (TOPROL-XL) 25 MG 24 hr tablet Take 1 tablet (25 mg total) by mouth daily. 90 tablet 1   omeprazole (PRILOSEC OTC) 20 MG tablet Take by mouth.     triamcinolone (NASACORT) 55 MCG/ACT AERO nasal inhaler Place 2 sprays into the nose daily.     No current facility-administered medications on file prior to visit.     ROS see history of present illness  Objective  Physical Exam There were no vitals filed for this visit.  BP Readings from Last 3 Encounters:  05/17/22 138/82  01/25/22 122/80  10/25/21 130/72   Wt Readings from Last 3 Encounters:  05/17/22 138 lb (62.6 kg)  01/25/22 138 lb 6 oz (62.8 kg)  10/25/21 141 lb 9.6 oz (64.2 kg)    Physical Exam   Assessment/Plan: Please see individual problem list.  There are no diagnoses linked to this encounter.   Health Maintenance: ***  No follow-ups on file.   Tomasita Morrow, NP-C [place]

## 2022-06-21 ENCOUNTER — Encounter: Payer: Self-pay | Admitting: Nurse Practitioner

## 2022-06-21 ENCOUNTER — Ambulatory Visit: Payer: BC Managed Care – PPO | Admitting: Nurse Practitioner

## 2022-06-21 VITALS — BP 140/82 | HR 62 | Temp 97.9°F | Ht 64.0 in | Wt 138.2 lb

## 2022-06-21 DIAGNOSIS — I10 Essential (primary) hypertension: Secondary | ICD-10-CM

## 2022-06-21 MED ORDER — AMLODIPINE BESYLATE 10 MG PO TABS: 10.0000 mg | ORAL_TABLET | Freq: Every day | ORAL | 1 refills | Status: DC

## 2022-06-21 NOTE — Telephone Encounter (Signed)
Per chart, GI appt scheduled

## 2022-06-21 NOTE — Assessment & Plan Note (Addendum)
Elevated reading x 2 in office. Will increase Norvasc to 10 mg daily. She recently got a refill on her 5 mg, advised to start taking 2 tablets in the morning until Rx finished then switch to 10 mg tablet. Rx sent to pharmacy. Counseled on side effects such as edema. She will continue to spot check blood pressures at home. Follow up in 1-2 weeks for blood pressure check.

## 2022-07-05 ENCOUNTER — Ambulatory Visit: Payer: BC Managed Care – PPO

## 2022-07-05 DIAGNOSIS — I10 Essential (primary) hypertension: Secondary | ICD-10-CM

## 2022-07-05 NOTE — Progress Notes (Signed)
Pt presented for a BP check and bp was taken in left arm bp was 138/88 O2 was 99 and Hr was 64.even though it was under 140/90. I had pt to sit for about 10 mins an I retook it and bp in left arm was 130/78 and O2 was 98 and Hr was 61

## 2022-07-25 ENCOUNTER — Encounter: Payer: Self-pay | Admitting: Internal Medicine

## 2022-07-25 NOTE — Telephone Encounter (Signed)
No diarrhea and vomiting since 2:30 this morning. Has been sipping gatorade and so far no issues- able to tolerate. Confirmed no other acute issues/symptoms. 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 She has only taken her metoprolol this morning. [place] is in [place] until [place]. Advised that Dr Nicki Reaper is out of office and would send to doc of day for advice

## 2022-08-12 ENCOUNTER — Ambulatory Visit: Payer: BC Managed Care – PPO

## 2022-08-12 DIAGNOSIS — Z1211 Encounter for screening for malignant neoplasm of colon: Secondary | ICD-10-CM

## 2022-09-15 ENCOUNTER — Encounter: Payer: Self-pay | Admitting: Internal Medicine

## 2022-09-15 ENCOUNTER — Ambulatory Visit (INDEPENDENT_AMBULATORY_CARE_PROVIDER_SITE_OTHER): Payer: BC Managed Care – PPO | Admitting: Internal Medicine

## 2022-09-15 VITALS — BP 124/72 | HR 62 | Temp 97.9°F | Resp 16 | Ht 64.0 in | Wt 136.8 lb

## 2022-09-15 DIAGNOSIS — I10 Essential (primary) hypertension: Secondary | ICD-10-CM | POA: Diagnosis not present

## 2022-09-15 DIAGNOSIS — Z0000 Encounter for general adult medical examination without abnormal findings: Secondary | ICD-10-CM

## 2022-09-15 DIAGNOSIS — R0981 Nasal congestion: Secondary | ICD-10-CM

## 2022-09-15 DIAGNOSIS — L989 Disorder of the skin and subcutaneous tissue, unspecified: Secondary | ICD-10-CM

## 2022-09-15 DIAGNOSIS — R000 Tachycardia, unspecified: Secondary | ICD-10-CM | POA: Insufficient documentation

## 2022-09-15 DIAGNOSIS — E041 Nontoxic single thyroid nodule: Secondary | ICD-10-CM | POA: Diagnosis not present

## 2022-09-15 DIAGNOSIS — F439 Reaction to severe stress, unspecified: Secondary | ICD-10-CM

## 2022-09-15 LAB — CBC WITH DIFFERENTIAL/PLATELET
Basophils Absolute: 0 10*3/uL (ref 0.0–0.1)
Basophils Relative: 0.6 % (ref 0.0–3.0)
Eosinophils Absolute: 0.1 10*3/uL (ref 0.0–0.7)
Eosinophils Relative: 2.1 % (ref 0.0–5.0)
HCT: 38.6 % (ref 36.0–46.0)
Hemoglobin: 13.3 g/dL (ref 12.0–15.0)
Lymphocytes Relative: 33.5 % (ref 12.0–46.0)
Lymphs Abs: 1.5 10*3/uL (ref 0.7–4.0)
MCHC: 34.5 g/dL (ref 30.0–36.0)
MCV: 90.4 fl (ref 78.0–100.0)
Monocytes Absolute: 0.5 10*3/uL (ref 0.1–1.0)
Monocytes Relative: 11.2 % (ref 3.0–12.0)
Neutro Abs: 2.4 10*3/uL (ref 1.4–7.7)
Neutrophils Relative %: 52.6 % (ref 43.0–77.0)
Platelets: 223 10*3/uL (ref 150.0–400.0)
RBC: 4.27 Mil/uL (ref 3.87–5.11)
RDW: 13 % (ref 11.5–15.5)
WBC: 4.6 10*3/uL (ref 4.0–10.5)

## 2022-09-15 LAB — COMPREHENSIVE METABOLIC PANEL
ALT: 19 U/L (ref 0–35)
AST: 21 U/L (ref 0–37)
Albumin: 4.1 g/dL (ref 3.5–5.2)
Alkaline Phosphatase: 105 U/L (ref 39–117)
BUN: 12 mg/dL (ref 6–23)
CO2: 32 mEq/L (ref 19–32)
Calcium: 8.9 mg/dL (ref 8.4–10.5)
Chloride: 101 mEq/L (ref 96–112)
Creatinine, Ser: 0.85 mg/dL (ref 0.40–1.20)
GFR: 77.26 mL/min (ref >60.00)
Glucose, Bld: 90 mg/dL (ref 70–99)
Potassium: 3.9 mEq/L (ref 3.5–5.1)
Sodium: 140 mEq/L (ref 135–145)
Total Bilirubin: 0.5 mg/dL (ref 0.2–1.2)
Total Protein: 8 g/dL (ref 6.0–8.3)

## 2022-09-15 LAB — LIPID PANEL
Cholesterol: 147 mg/dL (ref 0–200)
HDL: 44.1 mg/dL (ref >39.00)
LDL Cholesterol: 74 mg/dL (ref 0–99)
NonHDL: 102.83
Total CHOL/HDL Ratio: 3
Triglycerides: 146 mg/dL (ref 0.0–149.0)
VLDL: 29.2 mg/dL (ref 0.0–40.0)

## 2022-09-15 LAB — TSH: TSH: 3.73 u[IU]/mL (ref 0.35–5.50)

## 2022-09-15 MED ORDER — METOPROLOL SUCCINATE ER 25 MG PO TB24: 25.0000 mg | ORAL_TABLET | Freq: Every day | ORAL | 1 refills | Status: DC

## 2022-09-15 MED ORDER — AMLODIPINE BESYLATE 10 MG PO TABS: 10.0000 mg | ORAL_TABLET | Freq: Every day | ORAL | 1 refills | Status: DC

## 2022-09-15 NOTE — Assessment & Plan Note (Signed)
Physical today 09/15/22. Breasts, pap and pelvic exams through gyn. Need records.  Mammogram 03/11/22 - Birads I.  [facility]).  Has appt with gyn next month.  Colonoscopy 08/12/22 - normal.  Recommended f/u in 10 years.

## 2022-09-15 NOTE — Progress Notes (Signed)
Subjective:    Patient ID: Erika Daugherty, female    DOB: 1965/11/30, 57 y.o.   MRN: [id number]  Patient here for her physical exam.   HPI Here for a physical exam. Was evaluated in 06/2022 - elevated blood pressure.  Amlodipine increased to 10mg  q day.  Saw Dr Tedd Sias 12/20/21 - f/u thyroid nodule.  Recommended f/u thyroid ultrasound.  Had ultrasound 02/01/22 - stable. Had colonoscopy 08/12/22 - normal.  Recommended f/u in 10 years. Reports that while she was having her colonoscopy, the anesthesiologist reported initially the monitor was not normal.  "Went and checked the chart and came back and said "is your normal".  Reports when she walks into work and goes up two flights of stairs, will notice increased heart rate - 106-120.  Has on occasion noticed when walking - 140.  No chest pain.  Breathing stable.  No abdominal pain.  Did noticed over the past week, increased nasal congestion and sinus pressure.  Ears full.  Previous sore throat.  Better.  Some cough.  No sob.  Has been using nasacort and otc tylenol sinus.  Symptoms improved.     Past Medical History:  Diagnosis Date   Allergy    GERD (gastroesophageal reflux disease)    Heart murmur    Past Surgical History:  Procedure Laterality Date   VAGINAL DELIVERY     2   WISDOM TOOTH EXTRACTION     Family History  Problem Relation Age of Onset   Hypertension Mother    Hyperlipidemia Mother        triglycerides   Cancer Father        bile duct   Heart disease Father 6   Social History   Socioeconomic History   Marital status: Married    Spouse name: Not on file   Number of children: Not on file   Years of education: Not on file   Highest education level: Not on file  Occupational History   Not on file  Tobacco Use   Smoking status: Never   Smokeless tobacco: Never  Vaping Use   Vaping Use: Never used  Substance and Sexual Activity   Alcohol use: No    Comment: rare   Drug use: No   Sexual activity: Not on file   Other Topics Concern   Not on file  Social History Narrative   Lives in [place], [place] with husband and two children (16YO, 11YO) and dog.      Work - Tax inspector   Diet - relative healthy   Exercise - walk or elliptical 5 days per week   Social Determinants of Health   Financial Resource Strain: Not on file  Food Insecurity: Not on file  Transportation Needs: Not on file  Physical Activity: Not on file  Stress: Not on file  Social Connections: Not on file     Review of Systems  Constitutional:  Negative for appetite change and unexpected weight change.  HENT:  Positive for congestion and postnasal drip. Negative for sinus pressure.   Respiratory:  Negative for cough and chest tightness.        No increased sob.   Cardiovascular:  Negative for chest pain and leg swelling.       Increased heart rate as outlined.   Gastrointestinal:  Negative for abdominal pain, diarrhea, nausea and vomiting.  Genitourinary:  Negative for difficulty urinating and dysuria.  Musculoskeletal:  Negative for joint swelling and myalgias.  Skin:  Negative  for color change and rash.  Neurological:  Negative for dizziness and headaches.  Psychiatric/Behavioral:  Negative for agitation and dysphoric mood.        Objective:     BP 124/72   Pulse 62   Temp 97.9 F (36.6 C)   Resp 16   Ht 5\' 4"  (1.626 m)   Wt 136 lb 12.8 oz (62.1 kg)   LMP 01/18/2018   SpO2 98%   BMI 23.48 kg/m  Wt Readings from Last 3 Encounters:  09/15/22 136 lb 12.8 oz (62.1 kg)  06/21/22 138 lb 3.2 oz (62.7 kg)  05/17/22 138 lb (62.6 kg)    Physical Exam Vitals reviewed.  Constitutional:      General: She is not in acute distress.    Appearance: Normal appearance.  HENT:     Head: Normocephalic and atraumatic.     Right Ear: External ear normal.     Left Ear: External ear normal.  Eyes:     General: No scleral icterus.       Right eye: No discharge.        Left eye: No discharge.      Conjunctiva/sclera: Conjunctivae normal.  Neck:     Thyroid: No thyromegaly.  Cardiovascular:     Rate and Rhythm: Normal rate and regular rhythm.  Pulmonary:     Effort: No respiratory distress.     Breath sounds: Normal breath sounds. No wheezing.  Abdominal:     General: Bowel sounds are normal.     Palpations: Abdomen is soft.     Tenderness: There is no abdominal tenderness.  Musculoskeletal:        General: No swelling or tenderness.     Cervical back: Neck supple. No tenderness.  Lymphadenopathy:     Cervical: No cervical adenopathy.  Skin:    Findings: No erythema or rash.  Neurological:     Mental Status: She is alert.  Psychiatric:        Mood and Affect: Mood normal.        Behavior: Behavior normal.      Outpatient Encounter Medications as of 09/15/2022  Medication Sig   amLODipine (NORVASC) 10 MG tablet Take 1 tablet (10 mg total) by mouth daily.   bifidobacterium infantis (ALIGN) capsule Take 1 capsule by mouth daily.   metoprolol succinate (TOPROL-XL) 25 MG 24 hr tablet Take 1 tablet (25 mg total) by mouth daily.   omeprazole (PRILOSEC OTC) 20 MG tablet Take by mouth.   triamcinolone (NASACORT) 55 MCG/ACT AERO nasal inhaler Place 2 sprays into the nose daily.   [DISCONTINUED] amLODipine (NORVASC) 10 MG tablet Take 1 tablet (10 mg total) by mouth daily.   [DISCONTINUED] LORazepam (ATIVAN) 0.5 MG tablet 1/2 tablet q day prn (Patient not taking: Reported on 06/21/2022)   [DISCONTINUED] metoprolol succinate (TOPROL-XL) 25 MG 24 hr tablet Take 1 tablet (25 mg total) by mouth daily.   No facility-administered encounter medications on file as of 09/15/2022.     Lab Results  Component Value Date   WBC 4.6 09/15/2022   HGB 13.3 09/15/2022   HCT 38.6 09/15/2022   PLT 223.0 09/15/2022   GLUCOSE 90 09/15/2022   CHOL 147 09/15/2022   TRIG 146.0 09/15/2022   HDL 44.10 09/15/2022   LDLCALC 74 09/15/2022   ALT 19 09/15/2022   AST 21 09/15/2022   NA 140 09/15/2022    K 3.9 09/15/2022   CL 101 09/15/2022   CREATININE 0.85 09/15/2022   BUN 12  09/15/2022   CO2 32 09/15/2022   TSH 3.73 09/15/2022   HGBA1C 5.3 12/14/2015   MICROALBUR 0.8 11/05/2013    No results found.     Assessment & Plan:  Routine general medical examination at a health care facility  Annual physical exam Assessment & Plan: Physical today 09/15/22. Breasts, pap and pelvic exams through gyn. Need records.  Mammogram 03/11/22 - Birads I.  [facility]).  Has appt with gyn next month.  Colonoscopy 08/12/22 - normal.  Recommended f/u in 10 years.    Benign essential HTN Assessment & Plan: Blood pressure as outlined.  Continues on metoprolol and amlodipine. Blood pressure better.  Recently had amlodipine increased to 10mg  q day. Follow pressure.  Follow metabolic panel. Hold on making any changes in medication.   Orders: -     CBC with Differential/Platelet -     Comprehensive metabolic panel -     Lipid panel -     amLODIPine Besylate; Take 1 tablet (10 mg total) by mouth daily.  Dispense: 90 tablet; Refill: 1  Thyroid nodule Assessment & Plan: Saw endocrinology 10/2020.  Recommended f/u ultrasound in 1 year.  F/u thyroid ultrasound 01/2022 - stable.   Orders: -     TSH  Tachycardia Assessment & Plan: Increased heart rate as outlined.  Will notice up to 140 with walking into work and up stairs.  EKG - SR with no acute ischemic changes.  Discussed further w/up and evaluation. On metoprolol. Refer to cardiology for further evaluation and question of need for further w/up (possible zio monitor, echo, etc)   Orders: -     EKG 12-Lead -     Ambulatory referral to Cardiology  Facial lesion Assessment & Plan: Persistent.  Refer to dermatology for further evaluation.   Orders: -     Ambulatory referral to Dermatology  Congestion of nasal sinus Assessment & Plan: Continue nasacort.  Discussed avoiding otc decongestants - can increase blood pressure.  Saline nasal spray.   Hold abx.  Call with update/if symptoms progress.    Stress Assessment & Plan: Appears to be handling things relatively well.  Follow    Healthcare maintenance Assessment & Plan: Physical today.  Mammogram 03/11/22 - Birads I.  PAP 03/2021 - negative (atrophy) with negative HPV. Colonoscopy 08/16/22 - normal.  Recommended f/u in 10 years.    Other orders -     Metoprolol Succinate ER; Take 1 tablet (25 mg total) by mouth daily.  Dispense: 90 tablet; Refill: 1     Dale Ekalaka, MD

## 2022-09-16 ENCOUNTER — Other Ambulatory Visit: Payer: Self-pay

## 2022-09-16 ENCOUNTER — Encounter: Payer: Self-pay | Admitting: Internal Medicine

## 2022-09-16 MED ORDER — CEFDINIR 300 MG PO CAPS: 300.0000 mg | ORAL_CAPSULE | Freq: Two times a day (BID) | ORAL | 0 refills | Status: DC

## 2022-09-16 NOTE — Telephone Encounter (Signed)
Please call and see how she is doing now.  If feeling more congested, increased cough and colored mucus - can place on abx.  Confirm only abx allergy is erythromycin.  Confirm no other abx allergies.  If no other, then recommend omnicef 300mg  bid x 1 week.  Take a probiotic daily while on abx and for two weeks after

## 2022-09-16 NOTE — Telephone Encounter (Signed)
Called patient. Feels like she is more congested. Confirmed allergies but she did mention she has never taken this before. Omnicef sent in to pharmacy. Patient is aware. Advised to let us know if problems. Pt takes probiotic daily.

## 2022-09-19 ENCOUNTER — Encounter: Payer: Self-pay | Admitting: Internal Medicine

## 2022-09-19 NOTE — Assessment & Plan Note (Signed)
Saw endocrinology 10/2020.  Recommended f/u ultrasound in 1 year.  F/u thyroid ultrasound 01/2022 - stable.

## 2022-09-19 NOTE — Assessment & Plan Note (Signed)
Blood pressure as outlined.  Continues on metoprolol and amlodipine. Blood pressure better.  Recently had amlodipine increased to 10mg  q day. Follow pressure.  Follow metabolic panel. Hold on making any changes in medication.

## 2022-09-19 NOTE — Assessment & Plan Note (Signed)
Increased heart rate as outlined.  Will notice up to 140 with walking into work and up stairs.  EKG - SR with no acute ischemic changes.  Discussed further w/up and evaluation. On metoprolol. Refer to cardiology for further evaluation and question of need for further w/up (possible zio monitor, echo, etc)

## 2022-09-19 NOTE — Assessment & Plan Note (Signed)
Physical today.  Mammogram 03/11/22 - Birads I.  PAP 03/2021 - negative (atrophy) with negative HPV. Colonoscopy 08/16/22 - normal.  Recommended f/u in 10 years.

## 2022-09-19 NOTE — Assessment & Plan Note (Signed)
Appears to be handling things relatively well.  Follow.  

## 2022-09-19 NOTE — Assessment & Plan Note (Signed)
Persistent.  Refer to dermatology for further evaluation.   

## 2022-09-19 NOTE — Assessment & Plan Note (Signed)
Continue nasacort.  Discussed avoiding otc decongestants - can increase blood pressure.  Saline nasal spray.  Hold abx.  Call with update/if symptoms progress.

## 2022-11-29 ENCOUNTER — Encounter: Payer: Self-pay | Admitting: Cardiology

## 2022-11-29 ENCOUNTER — Ambulatory Visit: Payer: BC Managed Care – PPO | Attending: Cardiology | Admitting: Cardiology

## 2022-11-29 ENCOUNTER — Ambulatory Visit (INDEPENDENT_AMBULATORY_CARE_PROVIDER_SITE_OTHER): Payer: BC Managed Care – PPO

## 2022-11-29 VITALS — BP 148/92 | HR 59 | Ht 64.0 in | Wt 141.8 lb

## 2022-11-29 DIAGNOSIS — R000 Tachycardia, unspecified: Secondary | ICD-10-CM | POA: Diagnosis not present

## 2022-11-29 DIAGNOSIS — I10 Essential (primary) hypertension: Secondary | ICD-10-CM

## 2022-11-29 NOTE — Progress Notes (Signed)
Cardiology Office Note:    Date:  11/29/2022   ID:  Erika Daugherty, DOB 05-31-1965, MRN [id number]  PCP:  Dale [place], MD   [facility] Providers Cardiologist:  Debbe Odea, MD     Referring MD: Dale Leland, MD   Chief Complaint  Patient presents with   New Patient (Initial Visit)    Referred for exertional tachycardia evaluation.  Seen by Dr. Kirke Corin for palpitations in 2014.     Erika Daugherty is a 57 y.o. female who is being seen today for the evaluation of tachycardia at the request of Dale [place], MD.   History of Present Illness:    Erika Daugherty is a 57 y.o. female with a hx of hypertension, GERD who presents due to elevated heart rates.  Patient has noticed elevated heart rates usually when she exerts herself or walks upstairs.  Heart rates have gone up to 140s.  Denies palpitations, chest pain or shortness of breath.  Was told her heart rhythm was abnormal 3 months ago when she was getting her colonoscopy.  Last seen in our office in 2014 with symptoms of irregular heartbeat.  48-hour Holter monitor showed no significant arrhythmias, rare PVCs.  Echo showed normal EF.  Past Medical History:  Diagnosis Date   Allergy    GERD (gastroesophageal reflux disease)    Heart murmur    as a child   Hypertension     Past Surgical History:  Procedure Laterality Date   VAGINAL DELIVERY     2   WISDOM TOOTH EXTRACTION      Current Medications: Current Meds  Medication Sig   amLODipine (NORVASC) 10 MG tablet Take 1 tablet (10 mg total) by mouth daily.   bifidobacterium infantis (ALIGN) capsule Take 1 capsule by mouth daily.   metoprolol succinate (TOPROL-XL) 25 MG 24 hr tablet Take 1 tablet (25 mg total) by mouth daily.   omeprazole (PRILOSEC OTC) 20 MG tablet Take by mouth.   triamcinolone (NASACORT) 55 MCG/ACT AERO nasal inhaler Place 2 sprays into the nose daily.     Allergies:   Erythromycin, Lisinopril, Prednisone, and Strawberry  extract   Social History   Socioeconomic History   Marital status: Married    Spouse name: Not on file   Number of children: Not on file   Years of education: Not on file   Highest education level: Not on file  Occupational History   Not on file  Tobacco Use   Smoking status: Never   Smokeless tobacco: Never  Vaping Use   Vaping status: Never Used  Substance and Sexual Activity   Alcohol use: No    Comment: rare   Drug use: No   Sexual activity: Not on file  Other Topics Concern   Not on file  Social History Narrative   Lives in [place], [place] with husband and two children (16YO, 11YO) and dog.      Work - Tax inspector   Diet - relative healthy   Exercise - walk or elliptical 5 days per week   Social Determinants of Health   Financial Resource Strain: Not on file  Food Insecurity: Not on file  Transportation Needs: Not on file  Physical Activity: Not on file  Stress: Not on file  Social Connections: Not on file     Family History: The patient's family history includes Cancer in her father; Heart disease in her maternal grandfather; Heart disease (age of onset: 60) in her father; Hyperlipidemia  in her mother; Hypertension in her mother; Stroke in her mother.  ROS:   Please see the history of present illness.     All other systems reviewed and are negative.  EKGs/Labs/Other Studies Reviewed:    The following studies were reviewed today:  EKG Interpretation Date/Time:  Tuesday November 29 2022 08:41:47 EDT Ventricular Rate:  59 PR Interval:  154 QRS Duration:  96 QT Interval:  422 QTC Calculation: 417 R Axis:   52  Text Interpretation: Sinus bradycardia Otherwise normal ECG Confirmed by Debbe Odea ([id number]) on 11/29/2022 8:46:22 AM    Recent Labs: 09/15/2022: ALT 19; BUN 12; Creatinine, Ser 0.85; Hemoglobin 13.3; Platelets 223.0; Potassium 3.9; Sodium 140; TSH 3.73  Recent Lipid Panel    Component Value Date/Time   CHOL 147 09/15/2022 0853   TRIG  146.0 09/15/2022 0853   HDL 44.10 09/15/2022 0853   CHOLHDL 3 09/15/2022 0853   VLDL 29.2 09/15/2022 0853   LDLCALC 74 09/15/2022 0853     Risk Assessment/Calculations:    HYPERTENSION CONTROL Vitals:   11/29/22 0835 11/29/22 0843  BP: (!) 142/82 (!) 148/92    The patient's blood pressure is elevated above target today.  In order to address the patient's elevated BP: Blood pressure will be monitored at home to determine if medication changes need to be made.            Physical Exam:    VS:  BP (!) 148/92 (BP Location: Right Arm, Patient Position: Sitting, Cuff Size: Normal)   Pulse (!) 59   Ht 5\' 4"  (1.626 m)   Wt 141 lb 12.8 oz (64.3 kg)   LMP 01/18/2018   SpO2 98%   BMI 24.34 kg/m     Wt Readings from Last 3 Encounters:  11/29/22 141 lb 12.8 oz (64.3 kg)  09/15/22 136 lb 12.8 oz (62.1 kg)  06/21/22 138 lb 3.2 oz (62.7 kg)     GEN:  Well nourished, well developed in no acute distress HEENT: Normal NECK: No JVD; No carotid bruits CARDIAC: RRR,  RESPIRATORY:  Clear to auscultation without rales, wheezing or rhonchi  ABDOMEN: Soft, non-tender, non-distended MUSCULOSKELETAL:  No edema; No deformity  SKIN: Warm and dry NEUROLOGIC:  Alert and oriented x 3 PSYCHIATRIC:  Normal affect   ASSESSMENT:    1. Tachycardia   2. Primary hypertension    PLAN:    In order of problems listed above:  Elevated heart rates, associated with exertion.  Likely benign sinus tachycardia.  Heart rhythm abnormality during colonoscopy.  Place cardiac monitor to rule out any significant arrhythmias.  Patient is otherwise asymptomatic. Hypertension, BP elevated today, usually controlled.  Likely from being anxious today.  Continue Norvasc 10 mg daily, Toprol-XL 25 mg daily.  Monitor BP at home and keep log.  Follow-up in 6 weeks      Medication Adjustments/Labs and Tests Ordered: Current medicines are reviewed at length with the patient today.  Concerns regarding medicines are  outlined above.  Orders Placed This Encounter  Procedures   LONG TERM MONITOR (3-14 DAYS)   EKG 12-Lead   No orders of the defined types were placed in this encounter.   Patient Instructions  Medication Instructions:   Your physician recommends that you continue on your current medications as directed. Please refer to the Current Medication list given to you today.  *If you need a refill on your cardiac medications before your next appointment, please call your pharmacy*   Lab Work:  None Ordered  If you have labs (blood work) drawn today and your tests are completely normal, you will receive your results only by: MyChart Message (if you have MyChart) OR A paper copy in the mail If you have any lab test that is abnormal or we need to change your treatment, we will call you to review the results.   Testing/Procedures:  Your physician has recommended that you wear a Zio monitor.   This monitor is a medical device that records the heart's electrical activity. Doctors most often use these monitors to diagnose arrhythmias. Arrhythmias are problems with the speed or rhythm of the heartbeat. The monitor is a small device applied to your chest. You can wear one while you do your normal daily activities. While wearing this monitor if you have any symptoms to push the button and record what you felt. Once you have worn this monitor for the period of time provider prescribed (Usually 14 days), you will return the monitor device in the postage paid box. Once it is returned they will download the data collected and provide [place] with a report which the provider will then review and we will call you with those results. Important tips:  Avoid showering during the first 24 hours of wearing the monitor. Avoid excessive sweating to help maximize wear time. Do not submerge the device, no hot tubs, and no swimming pools. Keep any lotions or oils away from the patch. After 24 hours you may shower with  the patch on. Take brief showers with your back facing the shower head.  Do not remove patch once it has been placed because that will interrupt data and decrease adhesive wear time. Push the button when you have any symptoms and write down what you were feeling. Once you have completed wearing your monitor, remove and place into box which has postage paid and place in your outgoing mailbox.  If for some reason you have misplaced your box then call our office and we can provide another box and/or mail it off for you.      Follow-Up: At [facility], you and your health needs are our priority.  As part of our continuing mission to provide you with exceptional heart care, we have created designated Provider Care Teams.  These Care Teams include your primary Cardiologist (physician) and Advanced Practice Providers (APPs -  Physician Assistants and Nurse Practitioners) who all work together to provide you with the care you need, when you need it.  We recommend signing up for the patient portal called "MyChart".  Sign up information is provided on this After Visit Summary.  MyChart is used to connect with patients for Virtual Visits (Telemedicine).  Patients are able to view lab/test results, encounter notes, upcoming appointments, etc.  Non-urgent messages can be sent to your provider as well.   To learn more about what you can do with MyChart, go to [email or web address].    Your next appointment:   6 - 8 week(s)  Provider:   You may see Debbe Odea, MD or one of the following Advanced Practice Providers on your designated Care Team:   Nicolasa Ducking, NP Eula Listen, PA-C Cadence Fransico Michael, PA-C Charlsie Quest, NP    Signed, Debbe Odea, MD  11/29/2022 9:28 AM    [facility]

## 2022-11-29 NOTE — Patient Instructions (Signed)
Medication Instructions:   Your physician recommends that you continue on your current medications as directed. Please refer to the Current Medication list given to you today.  *If you need a refill on your cardiac medications before your next appointment, please call your pharmacy*   Lab Work:  None Ordered  If you have labs (blood work) drawn today and your tests are completely normal, you will receive your results only by: [place] (if you have MyChart) OR A paper copy in the mail If you have any lab test that is abnormal or we need to change your treatment, we will call you to review the results.   Testing/Procedures:  Your physician has recommended that you wear a Zio monitor.   This monitor is a medical device that records the heart's electrical activity. Doctors most often use these monitors to diagnose arrhythmias. Arrhythmias are problems with the speed or rhythm of the heartbeat. The monitor is a small device applied to your chest. You can wear one while you do your normal daily activities. While wearing this monitor if you have any symptoms to push the button and record what you felt. Once you have worn this monitor for the period of time provider prescribed (Usually 14 days), you will return the monitor device in the postage paid box. Once it is returned they will download the data collected and provide [place] with a report which the provider will then review and we will call you with those results. Important tips:  Avoid showering during the first 24 hours of wearing the monitor. Avoid excessive sweating to help maximize wear time. Do not submerge the device, no hot tubs, and no swimming pools. Keep any lotions or oils away from the patch. After 24 hours you may shower with the patch on. Take brief showers with your back facing the shower head.  Do not remove patch once it has been placed because that will interrupt data and decrease adhesive wear time. Push the button  when you have any symptoms and write down what you were feeling. Once you have completed wearing your monitor, remove and place into box which has postage paid and place in your outgoing mailbox.  If for some reason you have misplaced your box then call our office and we can provide another box and/or mail it off for you.      Follow-Up: At [facility], you and your health needs are our priority.  As part of our continuing mission to provide you with exceptional heart care, we have created designated Provider Care Teams.  These Care Teams include your primary Cardiologist (physician) and Advanced Practice Providers (APPs -  Physician Assistants and Nurse Practitioners) who all work together to provide you with the care you need, when you need it.  We recommend signing up for the patient portal called "MyChart".  Sign up information is provided on this After Visit Summary.  MyChart is used to connect with patients for Virtual Visits (Telemedicine).  Patients are able to view lab/test results, encounter notes, upcoming appointments, etc.  Non-urgent messages can be sent to your provider as well.   To learn more about what you can do with MyChart, go to [email or web address].    Your next appointment:   6 - 8 week(s)  Provider:   You may see Kate Sable, MD or one of the following Advanced Practice Providers on your designated Care Team:   Murray Hodgkins, NP Christell Faith, PA-C Cadence Kathlen Mody, PA-C Gerrie Nordmann, NP

## 2022-11-30 ENCOUNTER — Encounter (INDEPENDENT_AMBULATORY_CARE_PROVIDER_SITE_OTHER): Payer: Self-pay

## 2022-12-05 DIAGNOSIS — R000 Tachycardia, unspecified: Secondary | ICD-10-CM | POA: Diagnosis not present

## 2022-12-06 ENCOUNTER — Telehealth: Payer: Self-pay | Admitting: Cardiology

## 2022-12-06 NOTE — Telephone Encounter (Signed)
Pt is requesting a callback regarding her receiving a call from [facility] stating they were calling on behalf of the office from an 888 number but was asking for person information. She feels like may have been suspicious but she like to speak with a nurse to discuss further. Please advise

## 2022-12-06 NOTE — Telephone Encounter (Signed)
Called patient, advised that per the information sheet for [employer] that had the same number that contacted her. Advised this was them calling her, she will give them a call back to discuss.   Patient thankful for call back.

## 2022-12-10 IMAGING — US US THYROID
1 series · 13 of 25 positions shown · non-contrast
Comparison: None.

CLINICAL DATA: Thyroid fullness

EXAM:
THYROID ULTRASOUND
TECHNIQUE: Ultrasound examination of the thyroid gland and adjacent soft
tissues was performed.

[Series 1: us thyroid · 0.07mm/px · 13 of 45 slices shown]
[im 1/45]
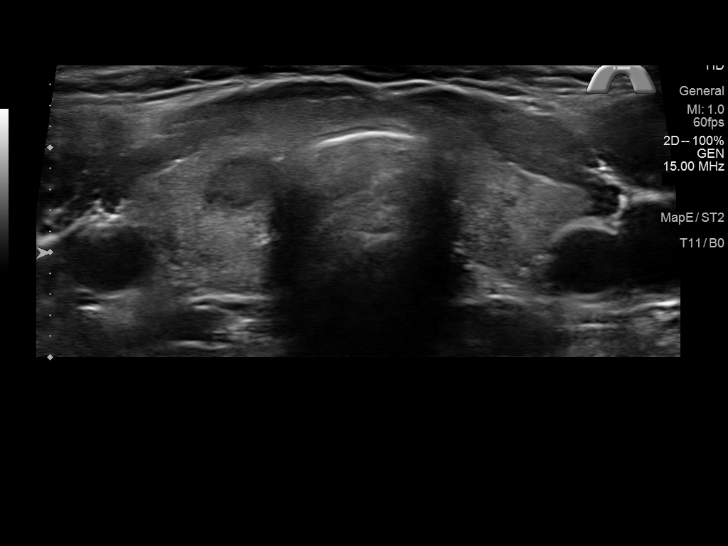
[im 4/45]
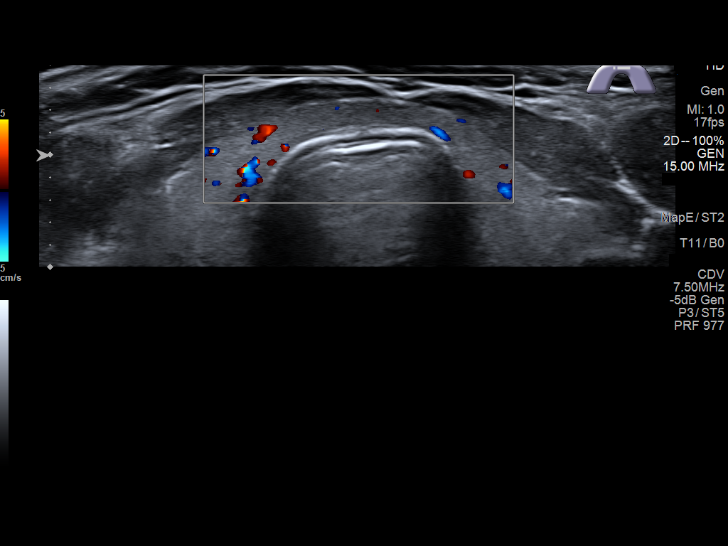
[im 8/45]
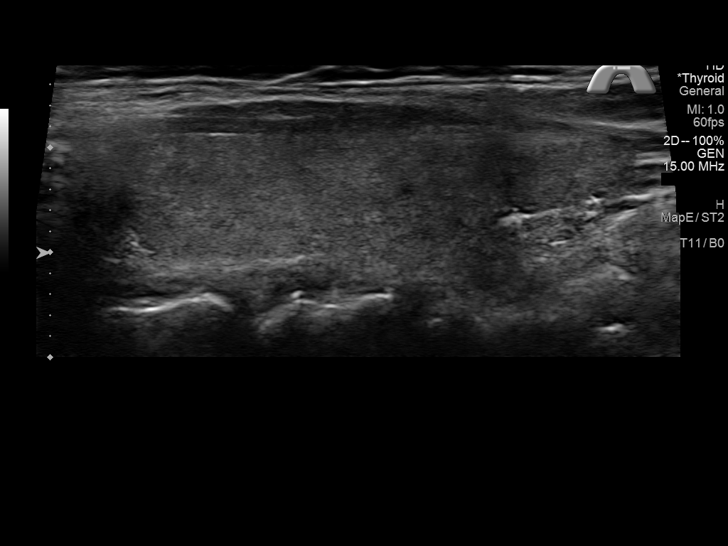
[im 12/45]
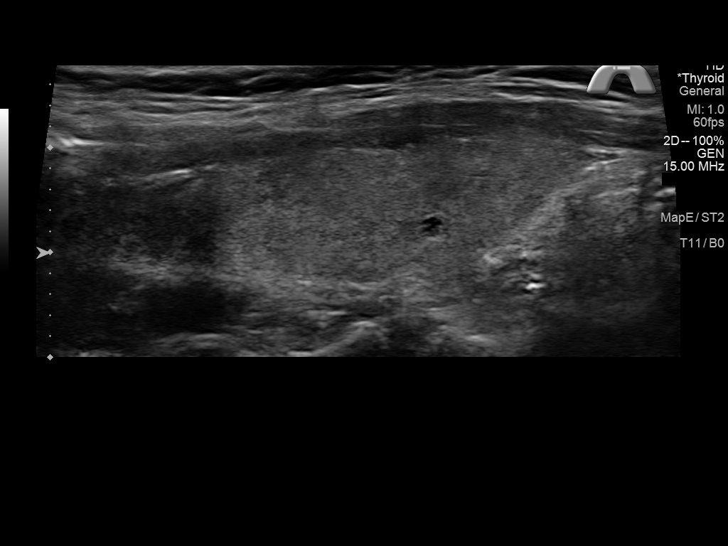
[im 15/45]
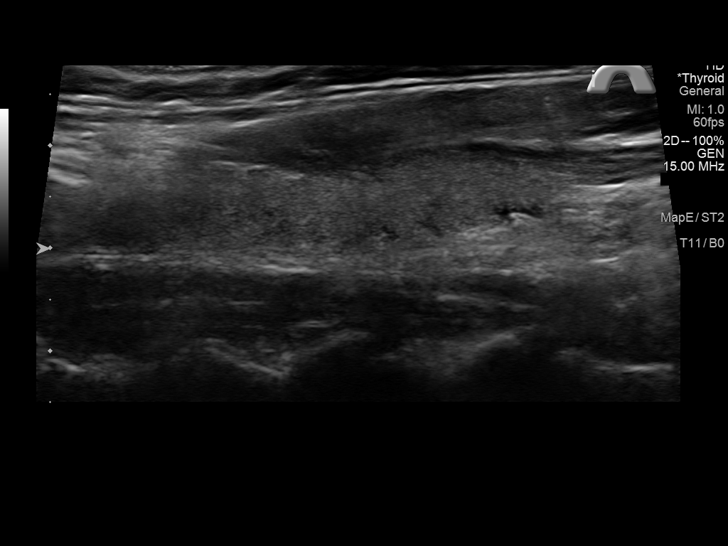
[im 19/45]
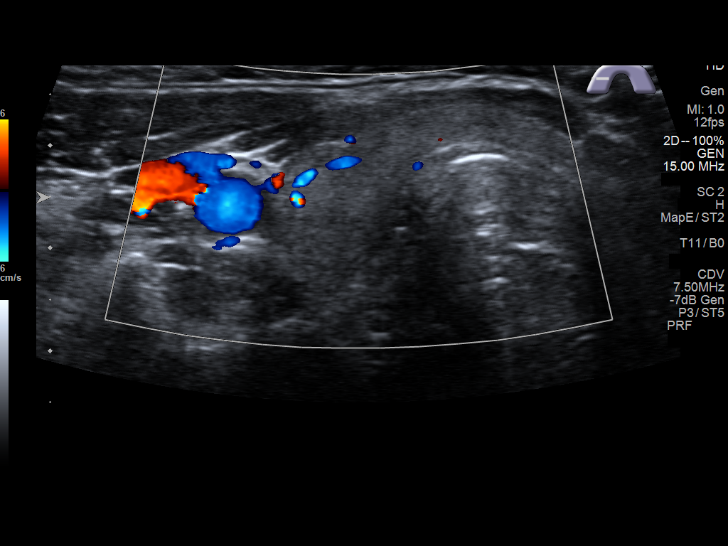
[im 23/45]
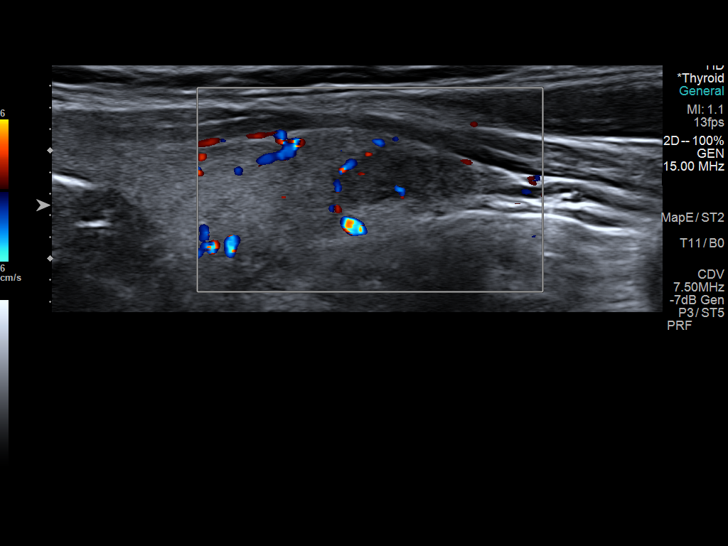
[im 26/45]
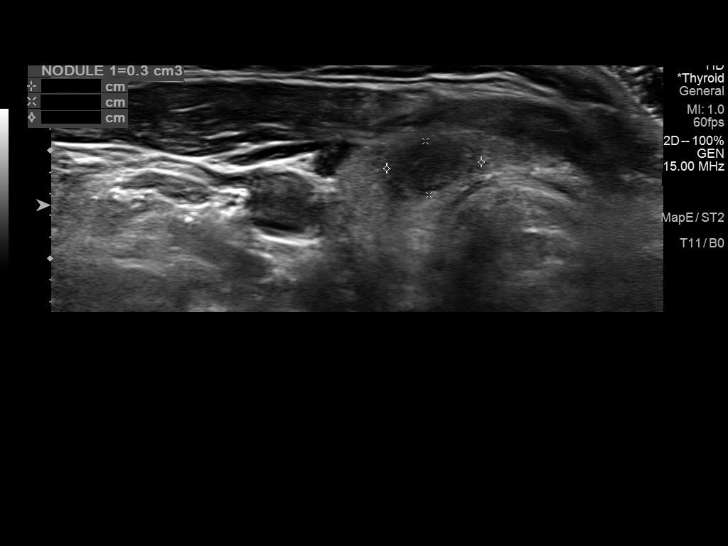
[im 30/45]
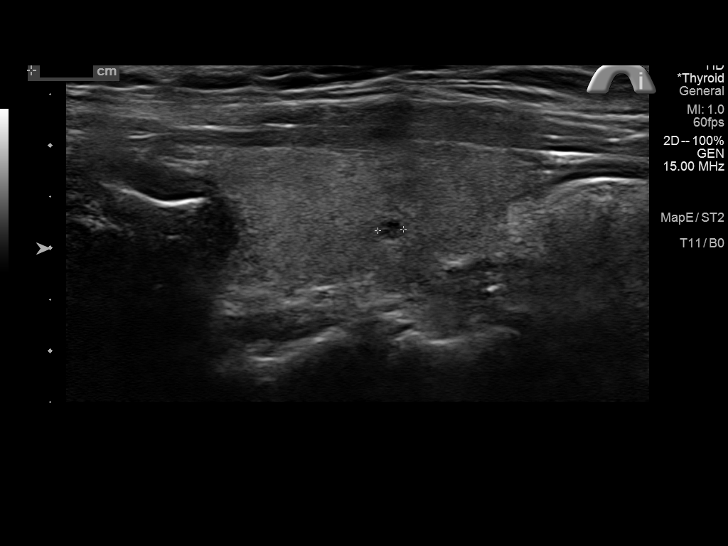
[im 34/45]
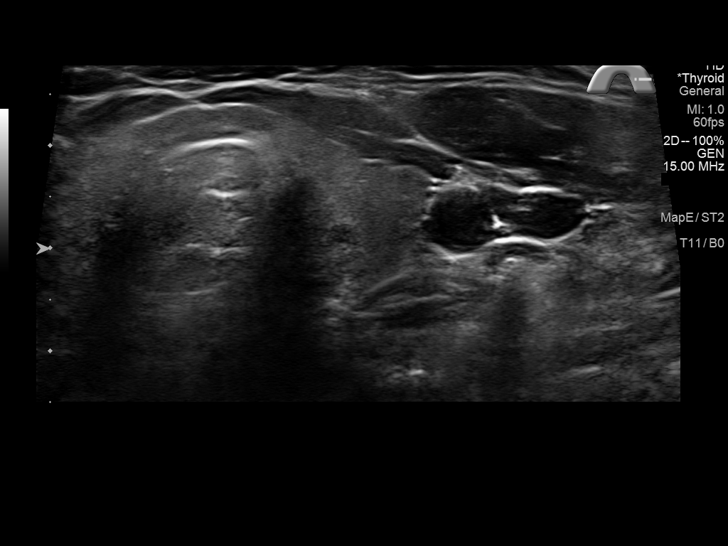
[im 37/45]
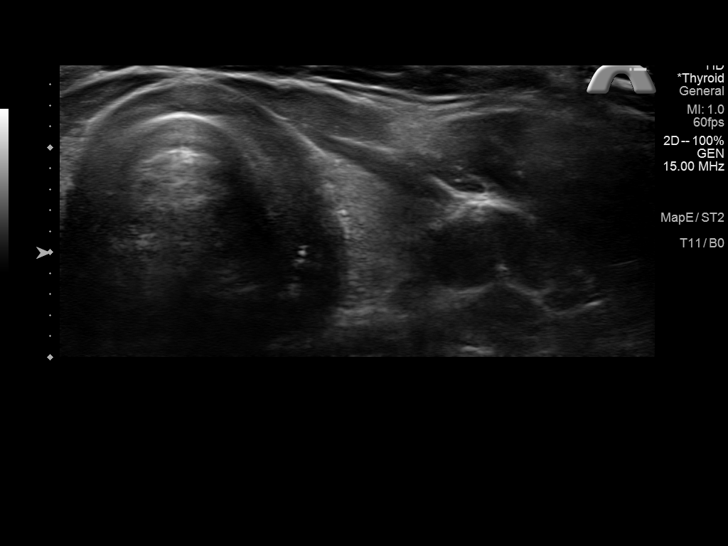
[im 41/45]
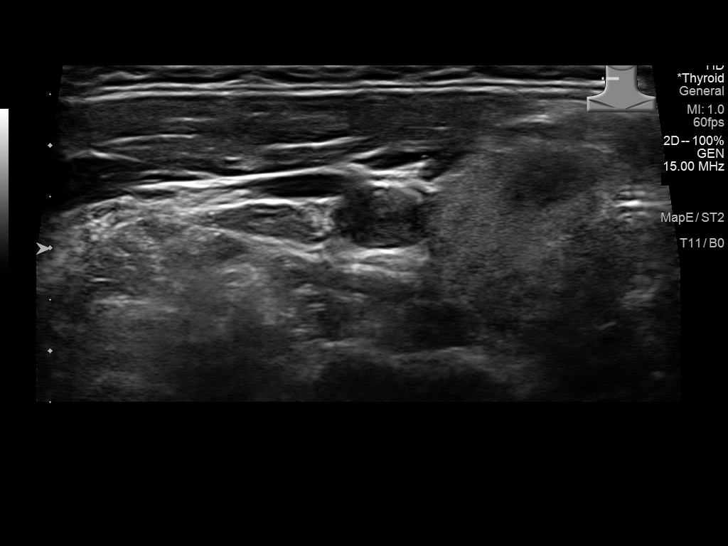
[im 45/45]
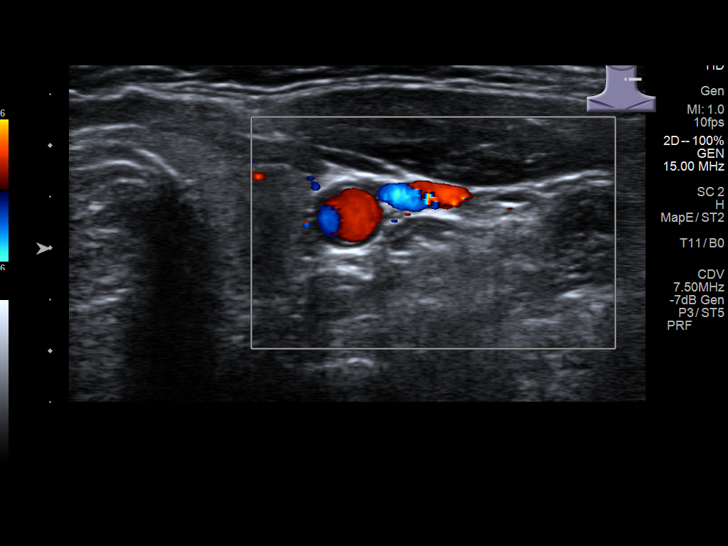

[13 of 25 positions shown; findings below may reference images not displayed]

FINDINGS: Parenchymal Echotexture: Mildly heterogeneous

Isthmus: 0.3 cm

Right lobe: 4.9 x 1.2 x 1.4 cm

Left lobe: 3.6 x 1.4 x 1.2 cm

_________________________________________________________

Estimated total number of nodules >/= 1 cm: 1

Number of spongiform nodules >/=  2 cm not described below (TR1): 0

Number of mixed cystic and solid nodules >/= 1.5 cm not described
below (TR2): 0

_________________________________________________________

Nodule # 1:

Location: Right; inferior

Maximum size: 1.3 cm; Other 2 dimensions: 0.9 x 0.5 cm

Composition: solid/almost completely solid (2)

Echogenicity: hypoechoic (2)

Shape: not taller-than-wide (0)

Margins: smooth (0)

Echogenic foci: none (0)

ACR TI-RADS total points: 4.

ACR TI-RADS risk category: TR4 (4-6 points).

ACR TI-RADS recommendations:

*Given size (>/= 1 - 1.4 cm) and appearance, a follow-up ultrasound
in 1 year should be considered based on TI-RADS criteria.

_________________________________________________________
IMPRESSION: Nodule 1 (TI-RADS 4) located in the inferior right thyroid lobe
meets criteria for surveillance. Follow-up thyroid ultrasound should
be performed in 1 year.

The above is in keeping with the ACR TI-RADS recommendations - [HOSPITAL] 8319;[DATE].

## 2022-12-16 ENCOUNTER — Ambulatory Visit: Payer: BC Managed Care – PPO | Admitting: Internal Medicine

## 2022-12-16 ENCOUNTER — Encounter: Payer: Self-pay | Admitting: Internal Medicine

## 2022-12-16 VITALS — BP 126/74 | HR 60 | Temp 97.8°F | Resp 16 | Ht 64.0 in | Wt 139.0 lb

## 2022-12-16 DIAGNOSIS — F439 Reaction to severe stress, unspecified: Secondary | ICD-10-CM | POA: Diagnosis not present

## 2022-12-16 DIAGNOSIS — E041 Nontoxic single thyroid nodule: Secondary | ICD-10-CM

## 2022-12-16 DIAGNOSIS — I10 Essential (primary) hypertension: Secondary | ICD-10-CM | POA: Diagnosis not present

## 2022-12-16 DIAGNOSIS — R000 Tachycardia, unspecified: Secondary | ICD-10-CM | POA: Diagnosis not present

## 2022-12-16 MED ORDER — AMLODIPINE BESYLATE 10 MG PO TABS: 10.0000 mg | ORAL_TABLET | Freq: Every day | ORAL | 1 refills | Status: DC

## 2022-12-16 MED ORDER — METOPROLOL SUCCINATE ER 25 MG PO TB24: 25.0000 mg | ORAL_TABLET | Freq: Every day | ORAL | 1 refills | Status: DC

## 2022-12-16 NOTE — Assessment & Plan Note (Signed)
Appears to be handling things relatively well.  Follow.  

## 2022-12-16 NOTE — Assessment & Plan Note (Signed)
Saw endocrinology 10/2020.  Recommended f/u ultrasound in 1 year.  F/u thyroid ultrasound 01/2022 - stable.

## 2022-12-16 NOTE — Progress Notes (Signed)
Subjective:    Patient ID: Erika Daugherty, female    DOB: [birth year], 57 y.o.   MRN: [id number]  Patient here for  Chief Complaint  Patient presents with   Medical Management of Chronic Issues    HPI Here to follow up regarding hypertension and tachycardia. Was evaluated in 06/2022 - elevated blood pressure. Amlodipine increased to 10mg  q day. Last visit, discussed increased heart rate with minimal exertion.  Saw cardiology 11/29/22.  Recommended cardiac monitor. Results pending. Saw Dr Tedd Sias 12/20/21 - f/u thyroid nodule. Recommended f/u thyroid ultrasound. Had ultrasound 02/01/22 - stable. Had colonoscopy 08/12/22 - normal. Recommended f/u in 10 years.  She reports she is doing well.  Exercising.  No chest pain or sob reported.  No abdominal pain.  Bowels stable.  Planning to follow up with oral surgeon - mouth lesion.  They have been monitoring closely.     Past Medical History:  Diagnosis Date   Allergy    GERD (gastroesophageal reflux disease)    Heart murmur    as a child   Hypertension    Past Surgical History:  Procedure Laterality Date   VAGINAL DELIVERY     2   WISDOM TOOTH EXTRACTION     Family History  Problem Relation Age of Onset   Hypertension Mother    Hyperlipidemia Mother        triglycerides   Stroke Mother    Cancer Father        bile duct   Heart disease Father 78   Heart disease Maternal Grandfather    Social History   Socioeconomic History   Marital status: Married    Spouse name: Not on file   Number of children: Not on file   Years of education: Not on file   Highest education level: Bachelor's degree (e.g., BA, AB, BS)  Occupational History   Not on file  Tobacco Use   Smoking status: Never   Smokeless tobacco: Never  Vaping Use   Vaping status: Never Used  Substance and Sexual Activity   Alcohol use: No    Comment: rare   Drug use: No   Sexual activity: Not on file  Other Topics Concern   Not on file  Social History Narrative    Lives in [place], [place] with husband and two children (16YO, 11YO) and dog.      Work - Tax inspector   Diet - relative healthy   Exercise - walk or elliptical 5 days per week   Social Determinants of Health   Financial Resource Strain: Low Risk  (12/13/2022)   Overall Financial Resource Strain (CARDIA)    Difficulty of Paying Living Expenses: Not hard at all  Food Insecurity: No Food Insecurity (12/13/2022)   Hunger Vital Sign    Worried About Running Out of Food in the Last Year: Never true    Ran Out of Food in the Last Year: Never true  Transportation Needs: No Transportation Needs (12/13/2022)   PRAPARE - Administrator, Civil Service (Medical): No    Lack of Transportation (Non-Medical): No  Physical Activity: Insufficiently Active (12/13/2022)   Exercise Vital Sign    Days of Exercise per Week: 3 days    Minutes of Exercise per Session: 30 min  Stress: No Stress Concern Present (12/13/2022)   [employer] of Occupational Health - Occupational Stress Questionnaire    Feeling of Stress : Not at all  Social Connections: Socially Integrated (12/13/2022)  Social Connection and Isolation Panel [NHANES]    Frequency of Communication with Friends and Family: Once a week    Frequency of Social Gatherings with Friends and Family: Three times a week    Attends Religious Services: 1 to 4 times per year    Active Member of Clubs or Organizations: Yes    Attends Banker Meetings: 1 to 4 times per year    Marital Status: Married     Review of Systems  Constitutional:  Negative for appetite change and unexpected weight change.  HENT:  Negative for congestion and sinus pressure.   Respiratory:  Negative for cough, chest tightness and shortness of breath.   Cardiovascular:  Negative for chest pain, palpitations and leg swelling.  Gastrointestinal:  Negative for abdominal pain, diarrhea, nausea and vomiting.  Genitourinary:  Negative for difficulty urinating  and dysuria.  Musculoskeletal:  Negative for joint swelling and myalgias.  Skin:  Negative for color change and rash.  Neurological:  Negative for dizziness and headaches.  Psychiatric/Behavioral:  Negative for agitation and dysphoric mood.        Objective:     BP 126/74   Pulse 60   Temp 97.8 F (36.6 C)   Resp 16   Ht 5\' 4"  (1.626 m)   Wt 139 lb (63 kg)   LMP 01/18/2018   SpO2 98%   BMI 23.86 kg/m  Wt Readings from Last 3 Encounters:  12/16/22 139 lb (63 kg)  11/29/22 141 lb 12.8 oz (64.3 kg)  09/15/22 136 lb 12.8 oz (62.1 kg)    Physical Exam Vitals reviewed.  Constitutional:      General: She is not in acute distress.    Appearance: Normal appearance.  HENT:     Head: Normocephalic and atraumatic.     Right Ear: External ear normal.     Left Ear: External ear normal.  Eyes:     General: No scleral icterus.       Right eye: No discharge.        Left eye: No discharge.     Conjunctiva/sclera: Conjunctivae normal.  Neck:     Thyroid: No thyromegaly.  Cardiovascular:     Rate and Rhythm: Normal rate and regular rhythm.  Pulmonary:     Effort: No respiratory distress.     Breath sounds: Normal breath sounds. No wheezing.  Abdominal:     General: Bowel sounds are normal.     Palpations: Abdomen is soft.     Tenderness: There is no abdominal tenderness.  Musculoskeletal:        General: No swelling or tenderness.     Cervical back: Neck supple. No tenderness.  Lymphadenopathy:     Cervical: No cervical adenopathy.  Skin:    Findings: No erythema or rash.  Neurological:     Mental Status: She is alert.  Psychiatric:        Mood and Affect: Mood normal.        Behavior: Behavior normal.      Outpatient Encounter Medications as of 12/16/2022  Medication Sig   bifidobacterium infantis (ALIGN) capsule Take 1 capsule by mouth daily.   omeprazole (PRILOSEC OTC) 20 MG tablet Take by mouth.   triamcinolone (NASACORT) 55 MCG/ACT AERO nasal inhaler Place 2  sprays into the nose daily.   [DISCONTINUED] amLODipine (NORVASC) 10 MG tablet Take 1 tablet (10 mg total) by mouth daily.   [DISCONTINUED] metoprolol succinate (TOPROL-XL) 25 MG 24 hr tablet Take 1 tablet (25  mg total) by mouth daily.   amLODipine (NORVASC) 10 MG tablet Take 1 tablet (10 mg total) by mouth daily.   metoprolol succinate (TOPROL-XL) 25 MG 24 hr tablet Take 1 tablet (25 mg total) by mouth daily.   [DISCONTINUED] cefdinir (OMNICEF) 300 MG capsule Take 1 capsule (300 mg total) by mouth 2 (two) times daily.   No facility-administered encounter medications on file as of 12/16/2022.     Lab Results  Component Value Date   WBC 4.6 09/15/2022   HGB 13.3 09/15/2022   HCT 38.6 09/15/2022   PLT 223.0 09/15/2022   GLUCOSE 90 09/15/2022   CHOL 147 09/15/2022   TRIG 146.0 09/15/2022   HDL 44.10 09/15/2022   LDLCALC 74 09/15/2022   ALT 19 09/15/2022   AST 21 09/15/2022   NA 140 09/15/2022   K 3.9 09/15/2022   CL 101 09/15/2022   CREATININE 0.85 09/15/2022   BUN 12 09/15/2022   CO2 32 09/15/2022   TSH 3.73 09/15/2022   HGBA1C 5.3 12/14/2015   MICROALBUR 0.8 11/05/2013    No results found.     Assessment & Plan:  Benign essential HTN Assessment & Plan: Blood pressure as outlined.  Continues on metoprolol and amlodipine.  Doing better.  No changes today. Follow pressure.  Follow metabolic panel.   Orders: -     amLODIPine Besylate; Take 1 tablet (10 mg total) by mouth daily.  Dispense: 90 tablet; Refill: 1  Thyroid nodule Assessment & Plan: Saw endocrinology 10/2020.  Recommended f/u ultrasound in 1 year.  F/u thyroid ultrasound 01/2022 - stable.    Tachycardia Assessment & Plan: Saw cardiology 11/29/22.  Recommended cardiac monitor. Results pending. Currently doing well.  Exercising.    Stress Assessment & Plan: Appears to be handling things relatively well.  Follow    Other orders -     Metoprolol Succinate ER; Take 1 tablet (25 mg total) by mouth daily.   Dispense: 90 tablet; Refill: 1     Dale , MD

## 2022-12-16 NOTE — Assessment & Plan Note (Signed)
Saw cardiology 11/29/22.  Recommended cardiac monitor. Results pending. Currently doing well.  Exercising.

## 2022-12-16 NOTE — Assessment & Plan Note (Signed)
Blood pressure as outlined.  Continues on metoprolol and amlodipine.  Doing better.  No changes today. Follow pressure.  Follow metabolic panel.

## 2023-01-14 ENCOUNTER — Ambulatory Visit
Admission: EM | Admit: 2023-01-14 | Discharge: 2023-01-14 | Disposition: A | Discharge status: Home / Self Care | Payer: BC Managed Care – PPO | Attending: Family Medicine | Admitting: Family Medicine

## 2023-01-14 DIAGNOSIS — J988 Other specified respiratory disorders: Secondary | ICD-10-CM

## 2023-01-14 MED ORDER — PROMETHAZINE-DM 6.25-15 MG/5ML PO SYRP: 5.0000 mL | ORAL_SOLUTION | Freq: Four times a day (QID) | ORAL | 0 refills | Status: DC | PRN

## 2023-01-14 NOTE — Discharge Instructions (Signed)
Medication as needed.   OTC antihistamine.  Supportive care.

## 2023-01-14 NOTE — ED Provider Notes (Signed)
[facility]    CSN: [id number] Arrival date & time: 01/14/23  4098      History   Chief Complaint Chief Complaint  Patient presents with   Nasal Congestion    HPI Erika Daugherty is a 57 y.o. female who presents for evaluation of the above.  Patient reports that she has had congestion and cough.  Postnasal drip.  Hoarseness.  She is worried about worsening chest congestion.  She has had a coworker who recently tested positive for COVID.  She has been symptomatic since Tuesday.  No fever.  No other complaints concerns this time.  Past Medical History:  Diagnosis Date   Allergy    GERD (gastroesophageal reflux disease)    Heart murmur    as a child   Hypertension     Patient Active Problem List   Diagnosis Date Noted   Tachycardia 09/15/2022   Facial lesion 09/15/2022   Healthcare maintenance 05/03/2022   Thyroid nodule 08/05/2020   Angioedema 12/28/2016   Annual physical exam 12/16/2016   Benign essential HTN 06/17/2015   Atypical nevi 10/17/2012    Past Surgical History:  Procedure Laterality Date   VAGINAL DELIVERY     2   WISDOM TOOTH EXTRACTION      OB History   No obstetric history on file.      Home Medications    Prior to Admission medications   Medication Sig Start Date End Date Taking? Authorizing Provider  amLODipine (NORVASC) 10 MG tablet Take 1 tablet (10 mg total) by mouth daily. 12/16/22  Yes Dale [place], MD  bifidobacterium infantis (ALIGN) capsule Take 1 capsule by mouth daily.   Yes [provider]  metoprolol succinate (TOPROL-XL) 25 MG 24 hr tablet Take 1 tablet (25 mg total) by mouth daily. 12/16/22  Yes Dale [place], MD  omeprazole (PRILOSEC OTC) 20 MG tablet Take by mouth.   Yes [provider]  promethazine-dextromethorphan (PROMETHAZINE-DM) 6.25-15 MG/5ML syrup Take 5 mLs by mouth 4 (four) times daily as needed. 01/14/23  Yes Tien Aispuro G, DO  triamcinolone (NASACORT) 55 MCG/ACT AERO nasal  inhaler Place 2 sprays into the nose daily.   Yes [provider]    Family History Family History  Problem Relation Age of Onset   Hypertension Mother    Hyperlipidemia Mother        triglycerides   Stroke Mother    Cancer Father        bile duct   Heart disease Father 24   Heart disease Maternal Grandfather     Social History Social History   Tobacco Use   Smoking status: Never   Smokeless tobacco: Never  Vaping Use   Vaping status: Never Used  Substance Use Topics   Alcohol use: No    Comment: rare   Drug use: No     Allergies   Erythromycin, Lisinopril, Prednisone, and Strawberry extract   Review of Systems Review of Systems Per HPI  Physical Exam Triage Vital Signs ED Triage Vitals  Encounter Vitals Group     BP 01/14/23 1019 138/85     Systolic BP Percentile --      Diastolic BP Percentile --      Pulse Rate 01/14/23 1019 68     Resp --      Temp 01/14/23 1019 98.8 F (37.1 C)     Temp Source 01/14/23 1019 Oral     SpO2 01/14/23 1019 95 %     Weight 01/14/23  1017 138 lb (62.6 kg)     Height 01/14/23 1017 5\' 4"  (1.626 m)     Head Circumference --      Peak Flow --      Pain Score 01/14/23 1017 0     Pain Loc --      Pain Education --      Exclude from Growth Chart --    No data found.  Updated Vital Signs BP 138/85 (BP Location: Left Arm)   Pulse 68   Temp 98.8 F (37.1 C) (Oral)   Ht 5\' 4"  (1.626 m)   Wt 62.6 kg   LMP 01/18/2018   SpO2 95%   BMI 23.69 kg/m   Visual Acuity Right Eye Distance:   Left Eye Distance:   Bilateral Distance:    Right Eye Near:   Left Eye Near:    Bilateral Near:     Physical Exam Vitals and nursing note reviewed.  Constitutional:      General: She is not in acute distress.    Appearance: Normal appearance.  HENT:     Head: Normocephalic and atraumatic.  Eyes:     General:        Right eye: No discharge.        Left eye: No discharge.     Conjunctiva/sclera: Conjunctivae normal.   Cardiovascular:     Rate and Rhythm: Normal rate and regular rhythm.  Pulmonary:     Effort: Pulmonary effort is normal.     Breath sounds: Normal breath sounds.  Neurological:     Mental Status: She is alert.      UC Treatments / Results  Labs (all labs ordered are listed, but only abnormal results are displayed) Labs Reviewed - No data to display  EKG   Radiology No results found.  Procedures Procedures (including critical care time)  Medications Ordered in UC Medications - No data to display  Initial Impression / Assessment and Plan / UC Course  I have reviewed the triage vital signs and the nursing notes.  Pertinent labs & imaging results that were available during my care of the patient were reviewed by me and considered in my medical decision making (see chart for details).    57 year old female presents with viral respiratory infection.  Discussed the merits of COVID testing.  Patient states that she does not desire treatment with antiviral medication even if she is positive for COVID-19.  She states that overall she is feeling okay.  She desires symptomatic treatment.  Promethazine DM for cough.  Over-the-counter antihistamine.  Continue nasal corticosteroid.  Final Clinical Impressions(s) / UC Diagnoses   Final diagnoses:  Respiratory infection     Discharge Instructions      Medication as needed.   OTC antihistamine.  Supportive care.    ED Prescriptions     Medication Sig Dispense Auth. Provider   promethazine-dextromethorphan (PROMETHAZINE-DM) 6.25-15 MG/5ML syrup Take 5 mLs by mouth 4 (four) times daily as needed. 118 mL Tommie Sams, DO      PDMP not reviewed this encounter.   Tommie Sams, [place] 01/14/23 1331

## 2023-01-14 NOTE — ED Triage Notes (Signed)
Pt c/o "stuff in my throat" x5days and loss of voice x1day  Pt is worried about chest congestion.   Pt denies sore throat and states that it is just congestion.

## 2023-01-16 ENCOUNTER — Encounter: Payer: Self-pay | Admitting: Internal Medicine

## 2023-01-16 NOTE — Telephone Encounter (Signed)
Phone call to pt to follow up on symptoms.  Was seen at urgent care 01/14/23 negative COVID test same day.  Pt reports that her symptoms started Tuesday 01/10/23. She is not having nasal congestion but is having a non-productive cough.  Does not feel any better since going to urgent care. Scheduled to see Dr. Darrick Huntsman 01/17/23 @ 11:30a.

## 2023-01-16 NOTE — Telephone Encounter (Signed)
Please call and confirm doing ok.  If persistent symptoms and problem, can schedule an appt to be evaluated.

## 2023-01-17 ENCOUNTER — Encounter: Payer: Self-pay | Admitting: Internal Medicine

## 2023-01-17 ENCOUNTER — Ambulatory Visit: Payer: BC Managed Care – PPO | Admitting: Internal Medicine

## 2023-01-17 VITALS — BP 116/76 | HR 85 | Temp 99.0°F | Ht 64.0 in | Wt 139.2 lb

## 2023-01-17 DIAGNOSIS — J40 Bronchitis, not specified as acute or chronic: Secondary | ICD-10-CM | POA: Diagnosis not present

## 2023-01-17 MED ORDER — BENZONATATE 200 MG PO CAPS: 200.0000 mg | ORAL_CAPSULE | Freq: Three times a day (TID) | ORAL | 1 refills | Status: DC | PRN

## 2023-01-17 MED ORDER — AMOXICILLIN-POT CLAVULANATE 875-125 MG PO TABS: 1.0000 | ORAL_TABLET | Freq: Two times a day (BID) | ORAL | 0 refills | Status: DC

## 2023-01-17 NOTE — Progress Notes (Signed)
Subjective:  Patient ID: Erika Daugherty, female    DOB: October 10, 1965  Age: 57 y.o. MRN: [id number]  CC: The encounter diagnosis was Bronchitis.   HPI Erika Daugherty presents for  Chief Complaint  Patient presents with   Cough    Cough and congestion   Persistent symptoms of respiratory infection  Treated on Sept 14 for viral URI that started 4 days prior after a COVID exposure.  Has had  NO IMPROVEMENT WITH TREATMENT FOR VIRAL URI.  COVID NEGATIVE TEST.  Started  HAVING FEVERS OVER THE WEEKEND AND PRODUCTIVE COUGH STARTED TODAY.   Left maxillary sinus tenderness with congesion,  no ear pain. Pleurisy or dyspnea. Taking coricidin and advil  has not used the phenergan cough syrup due to concern about excessive dextromethorphan   Coughing is waking her up at night     Outpatient Medications Prior to Visit  Medication Sig Dispense Refill   amLODipine (NORVASC) 10 MG tablet Take 1 tablet (10 mg total) by mouth daily. 90 tablet 1   bifidobacterium infantis (ALIGN) capsule Take 1 capsule by mouth daily.     metoprolol succinate (TOPROL-XL) 25 MG 24 hr tablet Take 1 tablet (25 mg total) by mouth daily. 90 tablet 1   omeprazole (PRILOSEC OTC) 20 MG tablet Take by mouth.     triamcinolone (NASACORT) 55 MCG/ACT AERO nasal inhaler Place 2 sprays into the nose daily.     promethazine-dextromethorphan (PROMETHAZINE-DM) 6.25-15 MG/5ML syrup Take 5 mLs by mouth 4 (four) times daily as needed. (Patient not taking: Reported on 01/17/2023) 118 mL 0   No facility-administered medications prior to visit.    Review of Systems;  Patient denies headache, fevers, malaise, unintentional weight loss, skin rash, eye pain, sinus congestion and sinus pain, sore throat, dysphagia,  hemoptysis , cough, dyspnea, wheezing, chest pain, palpitations, orthopnea, edema, abdominal pain, nausea, melena, diarrhea, constipation, flank pain, dysuria, hematuria, urinary  Frequency, nocturia, numbness, tingling, seizures,   Focal weakness, Loss of consciousness,  Tremor, insomnia, depression, anxiety, and suicidal ideation.      Objective:  BP 116/76   Pulse 85   Temp 99 F (37.2 C) (Oral)   Ht 5\' 4"  (1.626 m)   Wt 139 lb 3.2 oz (63.1 kg)   LMP 01/18/2018   SpO2 97%   BMI 23.89 kg/m   BP Readings from Last 3 Encounters:  01/17/23 116/76  01/14/23 138/85  12/16/22 126/74    Wt Readings from Last 3 Encounters:  01/17/23 139 lb 3.2 oz (63.1 kg)  01/14/23 138 lb (62.6 kg)  12/16/22 139 lb (63 kg)    Physical Exam Vitals reviewed.  Constitutional:      General: She is not in acute distress.    Appearance: Normal appearance. She is normal weight. She is not ill-appearing, toxic-appearing or diaphoretic.  HENT:     Head: Normocephalic.      Comments: Tender over left maxillary sinus  Eyes:     General: No scleral icterus.       Right eye: No discharge.        Left eye: No discharge.     Conjunctiva/sclera: Conjunctivae normal.  Cardiovascular:     Rate and Rhythm: Normal rate and regular rhythm.     Heart sounds: Normal heart sounds.  Pulmonary:     Effort: Pulmonary effort is normal. No respiratory distress.     Breath sounds: Normal breath sounds.  Musculoskeletal:        General: Normal range of  motion.  Skin:    General: Skin is warm and dry.  Neurological:     General: No focal deficit present.     Mental Status: She is alert and oriented to person, place, and time. Mental status is at baseline.  Psychiatric:        Mood and Affect: Mood normal.        Behavior: Behavior normal.        Thought Content: Thought content normal.        Judgment: Judgment normal.    Lab Results  Component Value Date   HGBA1C 5.3 12/14/2015    Lab Results  Component Value Date   CREATININE 0.85 09/15/2022   CREATININE 0.83 05/27/2022   CREATININE 0.91 05/17/2022    Lab Results  Component Value Date   WBC 4.6 09/15/2022   HGB 13.3 09/15/2022   HCT 38.6 09/15/2022   PLT 223.0  09/15/2022   GLUCOSE 90 09/15/2022   CHOL 147 09/15/2022   TRIG 146.0 09/15/2022   HDL 44.10 09/15/2022   LDLCALC 74 09/15/2022   ALT 19 09/15/2022   AST 21 09/15/2022   NA 140 09/15/2022   K 3.9 09/15/2022   CL 101 09/15/2022   CREATININE 0.85 09/15/2022   BUN 12 09/15/2022   CO2 32 09/15/2022   TSH 3.73 09/15/2022   HGBA1C 5.3 12/14/2015   MICROALBUR 0.8 11/05/2013    No results found.  Assessment & Plan:  .Bronchitis Assessment & Plan: Symptoms have escalated after one week of supportive care for viral URI  now having fevers and productive cough, and sinus pain without drainage.  Augmentin ,  tessalon added to regimen   Other orders -     Amoxicillin-Pot Clavulanate; Take 1 tablet by mouth 2 (two) times daily.  Dispense: 14 tablet; Refill: 0 -     Benzonatate; Take 1 capsule (200 mg total) by mouth 3 (three) times daily as needed for cough.  Dispense: 60 capsule; Refill: 1     Follow-up: No follow-ups on file.   Erika Shams, MD

## 2023-01-17 NOTE — Patient Instructions (Signed)
I am treating you for sinusitis/ bronchitis which is a complication from your viral infection    I am prescribing an antibiotic  augmentin      I also advise use of the following OTC meds to help with your other symptoms tylenol, advil for fevers, coricidin, robitussin or Delsym for the daytime cough    You ay add  benzonatate capsules  if needed for cough     Use the  liquid cough syrup at night for severe cough .  You are considered contagious until you have had no fever for 24 hours without the use of tylenol advil or aleve

## 2023-01-17 NOTE — Assessment & Plan Note (Signed)
Symptoms have escalated after one week of supportive care for viral URI  now having fevers and productive cough, and sinus pain without drainage.  Augmentin ,  tessalon added to regimen

## 2023-01-25 ENCOUNTER — Encounter: Payer: Self-pay | Admitting: Cardiology

## 2023-01-25 ENCOUNTER — Ambulatory Visit: Payer: BC Managed Care – PPO | Attending: Cardiology | Admitting: Cardiology

## 2023-01-25 ENCOUNTER — Encounter: Payer: Self-pay | Admitting: Internal Medicine

## 2023-01-25 VITALS — BP 148/82 | HR 56 | Resp 98 | Ht 64.0 in | Wt 140.4 lb

## 2023-01-25 DIAGNOSIS — R000 Tachycardia, unspecified: Secondary | ICD-10-CM

## 2023-01-25 DIAGNOSIS — I10 Essential (primary) hypertension: Secondary | ICD-10-CM

## 2023-01-25 NOTE — Progress Notes (Signed)
Cardiology Office Note:    Date:  01/25/2023   ID:  Erika Daugherty, DOB October 20, 1965, MRN [id number]  PCP:  Dale [place], MD   [facility] Providers Cardiologist:  Debbe Odea, MD     Referring MD: Dale Montague, MD   Chief Complaint  Patient presents with   Follow-up    Discuss cardiac testing results.  Patient denies new or acute cardiac problems/concerns today.      History of Present Illness:    Erika Daugherty is a 57 y.o. female with a hx of hypertension, GERD who presents for follow-up.  She was last seen due to elevated heart rates.  Elevated heart rates occurred with exertion.  Denies presyncope or syncope.  Drinks caffeine occasionally.  Cardiac monitor was placed to evaluate any significant arrhythmias.  Overall she states feeling fine, symptoms of elevated heart rates are somewhat improved.   Past Medical History:  Diagnosis Date   Allergy    GERD (gastroesophageal reflux disease)    Heart murmur    as a child   Hypertension     Past Surgical History:  Procedure Laterality Date   VAGINAL DELIVERY     2   WISDOM TOOTH EXTRACTION      Current Medications: Current Meds  Medication Sig   amLODipine (NORVASC) 10 MG tablet Take 1 tablet (10 mg total) by mouth daily.   bifidobacterium infantis (ALIGN) capsule Take 1 capsule by mouth daily.   metoprolol succinate (TOPROL-XL) 25 MG 24 hr tablet Take 1 tablet (25 mg total) by mouth daily.   omeprazole (PRILOSEC OTC) 20 MG tablet Take by mouth.   triamcinolone (NASACORT) 55 MCG/ACT AERO nasal inhaler Place 2 sprays into the nose daily.     Allergies:   Erythromycin, Lisinopril, Prednisone, and Strawberry extract   Social History   Socioeconomic History   Marital status: Married    Spouse name: Not on file   Number of children: Not on file   Years of education: Not on file   Highest education level: Bachelor's degree (e.g., BA, AB, BS)  Occupational History   Not on file  Tobacco  Use   Smoking status: Never   Smokeless tobacco: Never  Vaping Use   Vaping status: Never Used  Substance and Sexual Activity   Alcohol use: No    Comment: rare   Drug use: No   Sexual activity: Not on file  Other Topics Concern   Not on file  Social History Narrative   Lives in [place], [place] with husband and two children (55YO, 11YO) and dog.      Work - Tax inspector   Diet - relative healthy   Exercise - walk or elliptical 5 days per week   Social Determinants of Health   Financial Resource Strain: Low Risk  (12/13/2022)   Overall Financial Resource Strain (CARDIA)    Difficulty of Paying Living Expenses: Not hard at all  Food Insecurity: No Food Insecurity (12/13/2022)   Hunger Vital Sign    Worried About Running Out of Food in the Last Year: Never true    Ran Out of Food in the Last Year: Never true  Transportation Needs: No Transportation Needs (12/13/2022)   PRAPARE - Administrator, Civil Service (Medical): No    Lack of Transportation (Non-Medical): No  Physical Activity: Insufficiently Active (12/13/2022)   Exercise Vital Sign    Days of Exercise per Week: 3 days    Minutes of Exercise per  Session: 30 min  Stress: No Stress Concern Present (12/13/2022)   [employer] of Occupational Health - Occupational Stress Questionnaire    Feeling of Stress : Not at all  Social Connections: Socially Integrated (12/13/2022)   Social Connection and Isolation Panel [NHANES]    Frequency of Communication with Friends and Family: Once a week    Frequency of Social Gatherings with Friends and Family: Three times a week    Attends Religious Services: 1 to 4 times per year    Active Member of Clubs or Organizations: Yes    Attends Banker Meetings: 1 to 4 times per year    Marital Status: Married     Family History: The patient's family history includes Cancer in her father; Heart disease in her maternal grandfather; Heart disease (age of onset: 87)  in her father; Hyperlipidemia in her mother; Hypertension in her mother; Stroke in her mother.  ROS:   Please see the history of present illness.     All other systems reviewed and are negative.  EKGs/Labs/Other Studies Reviewed:    The following studies were reviewed today:       Recent Labs: 09/15/2022: ALT 19; BUN 12; Creatinine, Ser 0.85; Hemoglobin 13.3; Platelets 223.0; Potassium 3.9; Sodium 140; TSH 3.73  Recent Lipid Panel    Component Value Date/Time   CHOL 147 09/15/2022 0853   TRIG 146.0 09/15/2022 0853   HDL 44.10 09/15/2022 0853   CHOLHDL 3 09/15/2022 0853   VLDL 29.2 09/15/2022 0853   LDLCALC 74 09/15/2022 0853     Risk Assessment/Calculations:    HYPERTENSION CONTROL Vitals:   01/25/23 0808 01/25/23 0811  BP: (!) 150/84 (!) 148/82    The patient's blood pressure is elevated above target today.  In order to address the patient's elevated BP: Blood pressure will be monitored at home to determine if medication changes need to be made.         Physical Exam:    VS:  BP (!) 148/82 (BP Location: Left Arm, Patient Position: Sitting, Cuff Size: Normal)   Pulse (!) 56   Resp (!) 98   Ht 5\' 4"  (1.626 m)   Wt 140 lb 6.4 oz (63.7 kg)   LMP 01/18/2018   BMI 24.10 kg/m     Wt Readings from Last 3 Encounters:  01/25/23 140 lb 6.4 oz (63.7 kg)  01/17/23 139 lb 3.2 oz (63.1 kg)  01/14/23 138 lb (62.6 kg)     GEN:  Well nourished, well developed in no acute distress HEENT: Normal NECK: No JVD; No carotid bruits CARDIAC: RRR,  RESPIRATORY:  Clear to auscultation without rales, wheezing or rhonchi  ABDOMEN: Soft, non-tender, non-distended MUSCULOSKELETAL:  No edema; No deformity  SKIN: Warm and dry NEUROLOGIC:  Alert and oriented x 3 PSYCHIATRIC:  Normal affect   ASSESSMENT:    1. Tachycardia   2. Primary hypertension    PLAN:    In order of problems listed above:  Elevated heart rates, associated with exertion.  Cardiac monitor showed no  significant sustained arrhythmias, no A-fib or flutter, 1 run of SVT lasting 5 beats, average heart rate 60.  Overall benign cardiac monitor.  Patient made aware of results, reassured. Hypertension, BP elevated today, usually controlled.  Continue Norvasc 10 mg daily, Toprol-XL 25 mg daily.    Follow-up as needed.      Medication Adjustments/Labs and Tests Ordered: Current medicines are reviewed at length with the patient today.  Concerns regarding medicines are outlined  above.  No orders of the defined types were placed in this encounter.  No orders of the defined types were placed in this encounter.   Patient Instructions  Medication Instructions:  Your Physician recommend you continue on your current medication as directed.    *If you need a refill on your cardiac medications before your next appointment, please call your pharmacy*   Lab Work: None ordered today. If you have labs (blood work) drawn today and your tests are completely normal, you will receive your results only by: MyChart Message (if you have MyChart) OR A paper copy in the mail If you have any lab test that is abnormal or we need to change your treatment, we will call you to review the results.   Testing/Procedures: None ordered today.    Follow-Up: At [facility], you and your health needs are our priority.  As part of our continuing mission to provide you with exceptional heart care, we have created designated Provider Care Teams.  These Care Teams include your primary Cardiologist (physician) and Advanced Practice Providers (APPs -  Physician Assistants and Nurse Practitioners) who all work together to provide you with the care you need, when you need it.  We recommend signing up for the patient portal called "MyChart".  Sign up information is provided on this After Visit Summary.  MyChart is used to connect with patients for Virtual Visits (Telemedicine).  Patients are able to view lab/test  results, encounter notes, upcoming appointments, etc.  Non-urgent messages can be sent to your provider as well.   To learn more about what you can do with MyChart, go to [email or web address].    Your next appointment:   Follow up as needed.   Provider:   You may see Debbe Odea, MD or one of the following Advanced Practice Providers on your designated Care Team:   Nicolasa Ducking, NP Eula Listen, PA-C Cadence Fransico Michael, PA-C Charlsie Quest, NP        Signed, Debbe Odea, MD  01/25/2023 9:09 AM    [facility]

## 2023-01-25 NOTE — Patient Instructions (Signed)
Medication Instructions:  Your Physician recommend you continue on your current medication as directed.    *If you need a refill on your cardiac medications before your next appointment, please call your pharmacy*   Lab Work: None ordered today. If you have labs (blood work) drawn today and your tests are completely normal, you will receive your results only by: MyChart Message (if you have MyChart) OR A paper copy in the mail If you have any lab test that is abnormal or we need to change your treatment, we will call you to review the results.   Testing/Procedures: None ordered today.    Follow-Up: At [facility], you and your health needs are our priority.  As part of our continuing mission to provide you with exceptional heart care, we have created designated Provider Care Teams.  These Care Teams include your primary Cardiologist (physician) and Advanced Practice Providers (APPs -  Physician Assistants and Nurse Practitioners) who all work together to provide you with the care you need, when you need it.  We recommend signing up for the patient portal called "MyChart".  Sign up information is provided on this After Visit Summary.  MyChart is used to connect with patients for Virtual Visits (Telemedicine).  Patients are able to view lab/test results, encounter notes, upcoming appointments, etc.  Non-urgent messages can be sent to your provider as well.   To learn more about what you can do with MyChart, go to [email or web address].    Your next appointment:   Follow up as needed.   Provider:   You may see Debbe Odea, MD or one of the following Advanced Practice Providers on your designated Care Team:   Nicolasa Ducking, NP Eula Listen, PA-C Cadence Fransico Michael, PA-C Charlsie Quest, NP

## 2023-03-07 ENCOUNTER — Encounter: Payer: Self-pay | Admitting: Internal Medicine

## 2023-03-07 ENCOUNTER — Other Ambulatory Visit (INDEPENDENT_AMBULATORY_CARE_PROVIDER_SITE_OTHER): Payer: BC Managed Care – PPO

## 2023-03-07 DIAGNOSIS — R3915 Urgency of urination: Secondary | ICD-10-CM | POA: Diagnosis not present

## 2023-03-07 NOTE — Telephone Encounter (Signed)
Spoke with patient. She is going to come in this PM to leave urine sample and do virtual tomorrow at 11. Urine orders placed.

## 2023-03-08 ENCOUNTER — Encounter: Payer: Self-pay | Admitting: Internal Medicine

## 2023-03-08 ENCOUNTER — Telehealth: Payer: BC Managed Care – PPO | Admitting: Internal Medicine

## 2023-03-08 DIAGNOSIS — N3000 Acute cystitis without hematuria: Secondary | ICD-10-CM

## 2023-03-08 DIAGNOSIS — N390 Urinary tract infection, site not specified: Secondary | ICD-10-CM | POA: Insufficient documentation

## 2023-03-08 DIAGNOSIS — I10 Essential (primary) hypertension: Secondary | ICD-10-CM | POA: Diagnosis not present

## 2023-03-08 LAB — URINALYSIS, ROUTINE W REFLEX MICROSCOPIC
Bilirubin Urine: NEGATIVE
Hgb urine dipstick: NEGATIVE
Ketones, ur: NEGATIVE
Nitrite: POSITIVE — AB
RBC / HPF: NONE SEEN (ref 0–?)
Specific Gravity, Urine: 1.015 (ref 1.000–1.030)
Total Protein, Urine: NEGATIVE
Urine Glucose: NEGATIVE
Urobilinogen, UA: 1 (ref 0.0–1.0)
pH: 7 (ref 5.0–8.0)

## 2023-03-08 MED ORDER — NITROFURANTOIN MONOHYD MACRO 100 MG PO CAPS: 100.0000 mg | ORAL_CAPSULE | Freq: Two times a day (BID) | ORAL | 0 refills | Status: DC

## 2023-03-08 NOTE — Assessment & Plan Note (Signed)
Continues on metoprolol and amlodipine.

## 2023-03-08 NOTE — Progress Notes (Signed)
Patient ID: Erika Daugherty, female   DOB: July 22, 1965, 57 y.o.   MRN: [id number]   Virtual Visit via video Note  I connected with Erika Daugherty by a video enabled telemedicine application and verified that I am speaking with the correct person using two identifiers. Location patient: home Location provider: work  Persons participating in the virtual visit: patient, provider  The limitations, risks, security and privacy concerns of performing an evaluation and management service by video and the availability of in person appointments have been discussed.  It has also been discussed with the patient that there may be a patient responsible charge related to this service. The patient expressed understanding and agreed to proceed.   Reason for visit: work in appt  HPI: Work in with concerns regarding possible UTI. Reports symptoms started one week ago.  Reports some increased urgency - pressure.  "Feels different" when urinates.  No hematuria.  Urine is darker in the am.  Discussed increased water intake. No back pain.  No abdominal pain.    ROS: See pertinent positives and negatives per HPI.  Past Medical History:  Diagnosis Date   Allergy    GERD (gastroesophageal reflux disease)    Heart murmur    as a child   Hypertension     Past Surgical History:  Procedure Laterality Date   VAGINAL DELIVERY     2   WISDOM TOOTH EXTRACTION      Family History  Problem Relation Age of Onset   Hypertension Mother    Hyperlipidemia Mother        triglycerides   Stroke Mother    Cancer Father        bile duct   Heart disease Father 80   Heart disease Maternal Grandfather     SOCIAL HX: reviewed.    Current Outpatient Medications:    amLODipine (NORVASC) 10 MG tablet, Take 1 tablet (10 mg total) by mouth daily., Disp: 90 tablet, Rfl: 1   bifidobacterium infantis (ALIGN) capsule, Take 1 capsule by mouth daily., Disp: , Rfl:    metoprolol succinate (TOPROL-XL) 25 MG 24 hr tablet,  Take 1 tablet (25 mg total) by mouth daily., Disp: 90 tablet, Rfl: 1   nitrofurantoin, macrocrystal-monohydrate, (MACROBID) 100 MG capsule, Take 1 capsule (100 mg total) by mouth 2 (two) times daily., Disp: 10 capsule, Rfl: 0   omeprazole (PRILOSEC OTC) 20 MG tablet, Take by mouth., Disp: , Rfl:    triamcinolone (NASACORT) 55 MCG/ACT AERO nasal inhaler, Place 2 sprays into the nose daily., Disp: , Rfl:   EXAM:  GENERAL: alert, oriented, appears well and in no acute distress  HEENT: atraumatic, conjunttiva clear, no obvious abnormalities on inspection of external nose and ears  NECK: normal movements of the head and neck  LUNGS: on inspection no signs of respiratory distress, breathing rate appears normal, no obvious gross SOB, gasping or wheezing  CV: no obvious cyanosis  PSYCH/NEURO: pleasant and cooperative, no obvious depression or anxiety, speech and thought processing grossly intact  ASSESSMENT AND PLAN:  Discussed the following assessment and plan:  Problem List Items Addressed This Visit     Benign essential HTN    Continues on metoprolol and amlodipine.       UTI (urinary tract infection) - Primary    Discussed symptoms and urine findings.  Appear to be c/w UTI.  Treat with macrobid as directed.  Probiotics as directed.  Follow.  Stay hydrated.  Culture pending.  Relevant Medications   nitrofurantoin, macrocrystal-monohydrate, (MACROBID) 100 MG capsule    Return if symptoms worsen or fail to improve.   I discussed the assessment and treatment plan with the patient. The patient was provided an opportunity to ask questions and all were answered. The patient agreed with the plan and demonstrated an understanding of the instructions.   The patient was advised to call back or seek an in-person evaluation if the symptoms worsen or if the condition fails to improve as anticipated.    Dale [place], MD

## 2023-03-08 NOTE — Assessment & Plan Note (Signed)
Discussed symptoms and urine findings.  Appear to be c/w UTI.  Treat with macrobid as directed.  Probiotics as directed.  Follow.  Stay hydrated.  Culture pending.

## 2023-03-09 LAB — URINE CULTURE
MICRO NUMBER:: 15688411
SPECIMEN QUALITY:: ADEQUATE

## 2023-03-14 ENCOUNTER — Encounter: Payer: Self-pay | Admitting: Internal Medicine

## 2023-03-14 LAB — HM MAMMOGRAPHY

## 2023-03-14 NOTE — Telephone Encounter (Signed)
Needs to be seen. 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

## 2023-03-14 NOTE — Telephone Encounter (Signed)
FYI- spoke with patient. Her cat is up to date on shots and does not go outside. Bites do not look infected. She is keeping clean, dry and covered while at work. Advised patient to monitor and if becomes painful, red, etc- needs to be evaluated prior to taking abx. Pt agreed and will monitor.

## 2023-03-15 NOTE — Telephone Encounter (Signed)
Ok for virtual or if she can come in at the end of the day tomorrow as we discussed.

## 2023-03-15 NOTE — Telephone Encounter (Signed)
Pt scheduled  

## 2023-03-15 NOTE — Telephone Encounter (Signed)
Called patient and offered her an appointment today and tomorrow. Patient says she is unable to leave work because her coworkers mother had a stroke yesterday so she is out and it is just the two of them so she cannot take time off until she returns. Asked if patient could do a virtual and she said possibly or she could send you pictures through my chart.

## 2023-03-16 ENCOUNTER — Telehealth: Payer: BC Managed Care – PPO | Admitting: Internal Medicine

## 2023-03-16 ENCOUNTER — Encounter: Payer: Self-pay | Admitting: Internal Medicine

## 2023-03-16 VITALS — Ht 64.0 in | Wt 137.0 lb

## 2023-03-16 DIAGNOSIS — W5501XA Bitten by cat, initial encounter: Secondary | ICD-10-CM | POA: Diagnosis not present

## 2023-03-16 DIAGNOSIS — I10 Essential (primary) hypertension: Secondary | ICD-10-CM

## 2023-03-16 DIAGNOSIS — S61531A Puncture wound without foreign body of right wrist, initial encounter: Secondary | ICD-10-CM | POA: Diagnosis not present

## 2023-03-16 DIAGNOSIS — N3000 Acute cystitis without hematuria: Secondary | ICD-10-CM

## 2023-03-16 MED ORDER — AMLODIPINE BESYLATE 10 MG PO TABS: 10.0000 mg | ORAL_TABLET | Freq: Every day | ORAL | 1 refills | Status: DC

## 2023-03-16 MED ORDER — METOPROLOL SUCCINATE ER 25 MG PO TB24: 25.0000 mg | ORAL_TABLET | Freq: Every day | ORAL | 1 refills | Status: DC

## 2023-03-16 MED ORDER — AMOXICILLIN-POT CLAVULANATE 875-125 MG PO TABS: 1.0000 | ORAL_TABLET | Freq: Two times a day (BID) | ORAL | 0 refills | Status: DC

## 2023-03-16 NOTE — Assessment & Plan Note (Signed)
Cat bite of right lower arm/wrist.  Cat - up to date.  Last tetanus 07/2020.  Will cover with augmentin 875mg  bid.  Follow.  Call with update.

## 2023-03-16 NOTE — Assessment & Plan Note (Signed)
Symptoms resolved 

## 2023-03-16 NOTE — Progress Notes (Signed)
Patient ID: Erika Daugherty, female   DOB: 09-25-1965, 57 y.o.   MRN: [id number]   Virtual Visit via video Note  I connected with Erika Daugherty by a video enabled telemedicine application and verified that I am speaking with the correct person using two identifiers. Location patient: home Location provider: work Persons participating in the virtual visit: patient, provider  The limitations, risks, security and privacy concerns of performing an evaluation and management service by video and the availability of in person appointments have been discussed.  It has also been discussed with the patient that there may be a patient responsible charge related to this service. The patient expressed understanding and agreed to proceed.   Reason for visit: work in appt.   HPI: Work in appt.  Work in for Medical laboratory scientific officer bite. Reports her cat bit her 03/13/23. Her cat is up to date on vaccinations.  Does not go outside. She initially washed the wound and cleaned with peroxide and applied neosporin.  Bite - right wrist - small puncture wounds and scratches.  No increased erythema.  No increased pain.  No fever.  Previous urinary symptoms - resolved.    ROS: See pertinent positives and negatives per HPI.  Past Medical History:  Diagnosis Date   Allergy    GERD (gastroesophageal reflux disease)    Heart murmur    as a child   Hypertension     Past Surgical History:  Procedure Laterality Date   VAGINAL DELIVERY     2   WISDOM TOOTH EXTRACTION      Family History  Problem Relation Age of Onset   Hypertension Mother    Hyperlipidemia Mother        triglycerides   Stroke Mother    Cancer Father        bile duct   Heart disease Father 35   Heart disease Maternal Grandfather     SOCIAL HX: reviewed.    Current Outpatient Medications:    amoxicillin-clavulanate (AUGMENTIN) 875-125 MG tablet, Take 1 tablet by mouth 2 (two) times daily., Disp: 14 tablet, Rfl: 0   amLODipine (NORVASC) 10 MG tablet,  Take 1 tablet (10 mg total) by mouth daily., Disp: 90 tablet, Rfl: 1   bifidobacterium infantis (ALIGN) capsule, Take 1 capsule by mouth daily., Disp: , Rfl:    metoprolol succinate (TOPROL-XL) 25 MG 24 hr tablet, Take 1 tablet (25 mg total) by mouth daily., Disp: 90 tablet, Rfl: 1   omeprazole (PRILOSEC OTC) 20 MG tablet, Take by mouth., Disp: , Rfl:    triamcinolone (NASACORT) 55 MCG/ACT AERO nasal inhaler, Place 2 sprays into the nose daily., Disp: , Rfl:   EXAM:  GENERAL: alert, oriented, appears well and in no acute distress  HEENT: atraumatic, conjunttiva clear, no obvious abnormalities on inspection of external nose and ears  NECK: normal movements of the head and neck  LUNGS: on inspection no signs of respiratory distress, breathing rate appears normal, no obvious gross SOB, gasping or wheezing  CV: no obvious cyanosis  SKIN:  few puncture wounds right wrist (small circular). No increased erythema.   PSYCH/NEURO: pleasant and cooperative, no obvious depression or anxiety, speech and thought processing grossly intact  ASSESSMENT AND PLAN:  Discussed the following assessment and plan:  Problem List Items Addressed This Visit     Benign essential HTN   Relevant Medications   amLODipine (NORVASC) 10 MG tablet   metoprolol succinate (TOPROL-XL) 25 MG 24 hr tablet   Cat bite - Primary  Cat bite of right lower arm/wrist.  Cat - up to date.  Last tetanus 07/2020.  Will cover with augmentin 875mg  bid.  Follow.  Call with update.       UTI (urinary tract infection)    Symptoms resolved.        Return if symptoms worsen or fail to improve.   I discussed the assessment and treatment plan with the patient. The patient was provided an opportunity to ask questions and all were answered. The patient agreed with the plan and demonstrated an understanding of the instructions.   The patient was advised to call back or seek an in-person evaluation if the symptoms worsen or if the  condition fails to improve as anticipated.    Dale [place], MD

## 2023-03-28 ENCOUNTER — Encounter: Payer: Self-pay | Admitting: Internal Medicine

## 2023-04-10 ENCOUNTER — Encounter: Payer: Self-pay | Admitting: Internal Medicine

## 2023-04-11 NOTE — Telephone Encounter (Signed)
Since her symptoms are improving, can continue amoxicillin. Call us with update. If needs appt Thursday pm or Friday - can schedule an appt. Let us know if worsening problems.

## 2023-04-18 ENCOUNTER — Encounter: Payer: Self-pay | Admitting: Internal Medicine

## 2023-04-18 ENCOUNTER — Ambulatory Visit: Payer: BC Managed Care – PPO | Admitting: Internal Medicine

## 2023-04-18 VITALS — BP 128/74 | HR 64 | Temp 98.2°F | Resp 16 | Ht 64.0 in | Wt 140.6 lb

## 2023-04-18 DIAGNOSIS — I10 Essential (primary) hypertension: Secondary | ICD-10-CM | POA: Diagnosis not present

## 2023-04-18 DIAGNOSIS — R000 Tachycardia, unspecified: Secondary | ICD-10-CM

## 2023-04-18 DIAGNOSIS — E041 Nontoxic single thyroid nodule: Secondary | ICD-10-CM

## 2023-04-18 DIAGNOSIS — R300 Dysuria: Secondary | ICD-10-CM | POA: Diagnosis not present

## 2023-04-18 DIAGNOSIS — N3000 Acute cystitis without hematuria: Secondary | ICD-10-CM

## 2023-04-18 LAB — BASIC METABOLIC PANEL
BUN: 15 mg/dL (ref 6–23)
CO2: 30 meq/L (ref 19–32)
Calcium: 9.1 mg/dL (ref 8.4–10.5)
Chloride: 104 meq/L (ref 96–112)
Creatinine, Ser: 0.85 mg/dL (ref 0.40–1.20)
GFR: 76.94 mL/min (ref >60.00)
Glucose, Bld: 99 mg/dL (ref 70–99)
Potassium: 3.7 meq/L (ref 3.5–5.1)
Sodium: 141 meq/L (ref 135–145)

## 2023-04-18 LAB — URINALYSIS, ROUTINE W REFLEX MICROSCOPIC
Bilirubin Urine: NEGATIVE
Hgb urine dipstick: NEGATIVE
Ketones, ur: NEGATIVE
Nitrite: POSITIVE — AB
RBC / HPF: NONE SEEN (ref 0–?)
Specific Gravity, Urine: 1.015 (ref 1.000–1.030)
Urine Glucose: 100 — AB
Urobilinogen, UA: 1 (ref 0.0–1.0)
pH: 6.5 (ref 5.0–8.0)

## 2023-04-18 NOTE — Assessment & Plan Note (Signed)
Saw endocrinology 10/2020.  Recommended f/u ultrasound in 1 year.  F/u thyroid ultrasound 01/2022 - stable.

## 2023-04-18 NOTE — Progress Notes (Signed)
Subjective:    Patient ID: Erika Daugherty, female    DOB: 09/16/1965, 57 y.o.   MRN: [id number]  Patient here for  Chief Complaint  Patient presents with   Medical Management of Chronic Issues    HPI Here for a scheduled follow up - follow up regarding her blood pressure. Saw Dr Tedd Sias 12/20/21 - f/u thyroid nodule. Recommended f/u thyroid ultrasound. Had ultrasound 02/01/22 - stable. Had colonoscopy 08/12/22 - normal. Recommended f/u in 10 years. Had f/u with cardiology 01/25/23 - Cardiac monitor showed no significant sustained arrhythmias, no A-fib or flutter, 1 run of SVT lasting 5 beats, average heart rate 60. Overall benign cardiac monitor. Last week, reported having increased urgency and some discomfort with urination. Had amoxicillin - she took for the UTI. Symptoms improved, but she has noticed some persistent/return of dysuria and pressure feeling. No vaginal symptoms. No discharge. No fever, nausea or vomiting. No abdominal pain.  Some suprapubic pressure. Some increased stress - family.  Overall she feels she is handling things relatively well.    Past Medical History:  Diagnosis Date   Allergy    GERD (gastroesophageal reflux disease)    Heart murmur    as a child   Hypertension    Past Surgical History:  Procedure Laterality Date   VAGINAL DELIVERY     2   WISDOM TOOTH EXTRACTION     Family History  Problem Relation Age of Onset   Hypertension Mother    Hyperlipidemia Mother        triglycerides   Stroke Mother    Cancer Father        bile duct   Heart disease Father 22   Heart disease Maternal Grandfather    Social History   Socioeconomic History   Marital status: Married    Spouse name: Not on file   Number of children: Not on file   Years of education: Not on file   Highest education level: Bachelor's degree (e.g., BA, AB, BS)  Occupational History   Not on file  Tobacco Use   Smoking status: Never   Smokeless tobacco: Never  Vaping Use   Vaping  status: Never Used  Substance and Sexual Activity   Alcohol use: No    Comment: rare   Drug use: No   Sexual activity: Not on file  Other Topics Concern   Not on file  Social History Narrative   Lives in [place], [place] with husband and two children (16YO, 11YO) and dog.      Work - Tax inspector   Diet - relative healthy   Exercise - walk or elliptical 5 days per week   Social Drivers of Health   Financial Resource Strain: Low Risk  (04/17/2023)   Overall Financial Resource Strain (CARDIA)    Difficulty of Paying Living Expenses: Not hard at all  Food Insecurity: No Food Insecurity (04/17/2023)   Hunger Vital Sign    Worried About Running Out of Food in the Last Year: Never true    Ran Out of Food in the Last Year: Never true  Transportation Needs: No Transportation Needs (04/17/2023)   PRAPARE - Administrator, Civil Service (Medical): No    Lack of Transportation (Non-Medical): No  Physical Activity: Insufficiently Active (04/17/2023)   Exercise Vital Sign    Days of Exercise per Week: 2 days    Minutes of Exercise per Session: 30 min  Stress: No Stress Concern Present (04/17/2023)  [employer] of Occupational Health - Occupational Stress Questionnaire    Feeling of Stress : Not at all  Social Connections: Socially Integrated (04/17/2023)   Social Connection and Isolation Panel [NHANES]    Frequency of Communication with Friends and Family: Twice a week    Frequency of Social Gatherings with Friends and Family: Once a week    Attends Religious Services: 1 to 4 times per year    Active Member of [employer] or Organizations: Yes    Attends Banker Meetings: 1 to 4 times per year    Marital Status: Married     Review of Systems  Constitutional:  Negative for appetite change and unexpected weight change.  HENT:  Negative for congestion and sinus pressure.   Respiratory:  Negative for cough, chest tightness and shortness of breath.    Cardiovascular:  Negative for chest pain, palpitations and leg swelling.  Gastrointestinal:  Negative for abdominal pain, diarrhea, nausea and vomiting.  Genitourinary:  Positive for dysuria. Negative for difficulty urinating.  Musculoskeletal:  Negative for joint swelling and myalgias.  Skin:  Negative for color change and rash.  Neurological:  Negative for dizziness and headaches.  Psychiatric/Behavioral:  Negative for agitation and dysphoric mood.        Objective:     BP 128/74   Pulse 64   Temp 98.2 F (36.8 C)   Resp 16   Ht 5\' 4"  (1.626 m)   Wt 140 lb 9.6 oz (63.8 kg)   LMP 01/18/2018   SpO2 98%   BMI 24.13 kg/m  Wt Readings from Last 3 Encounters:  04/18/23 140 lb 9.6 oz (63.8 kg)  03/16/23 137 lb (62.1 kg)  01/25/23 140 lb 6.4 oz (63.7 kg)    Physical Exam Vitals reviewed.  Constitutional:      General: She is not in acute distress.    Appearance: Normal appearance.  HENT:     Head: Normocephalic and atraumatic.     Right Ear: External ear normal.     Left Ear: External ear normal.     Mouth/Throat:     Pharynx: No oropharyngeal exudate or posterior oropharyngeal erythema.  Eyes:     General: No scleral icterus.       Right eye: No discharge.        Left eye: No discharge.     Conjunctiva/sclera: Conjunctivae normal.  Neck:     Thyroid: No thyromegaly.  Cardiovascular:     Rate and Rhythm: Normal rate and regular rhythm.  Pulmonary:     Effort: No respiratory distress.     Breath sounds: Normal breath sounds. No wheezing.  Abdominal:     General: Bowel sounds are normal.     Palpations: Abdomen is soft.     Comments: Minimal suprapubic pressure - with palpation.   Musculoskeletal:        General: No swelling or tenderness.     Cervical back: Neck supple. No tenderness.  Lymphadenopathy:     Cervical: No cervical adenopathy.  Skin:    Findings: No erythema or rash.  Neurological:     Mental Status: She is alert.  Psychiatric:        Mood  and Affect: Mood normal.        Behavior: Behavior normal.      Outpatient Encounter Medications as of 04/18/2023  Medication Sig   amLODipine (NORVASC) 10 MG tablet Take 1 tablet (10 mg total) by mouth daily.   bifidobacterium infantis (ALIGN) capsule Take  1 capsule by mouth daily.   metoprolol succinate (TOPROL-XL) 25 MG 24 hr tablet Take 1 tablet (25 mg total) by mouth daily.   omeprazole (PRILOSEC OTC) 20 MG tablet Take by mouth.   triamcinolone (NASACORT) 55 MCG/ACT AERO nasal inhaler Place 2 sprays into the nose daily.   [DISCONTINUED] amoxicillin-clavulanate (AUGMENTIN) 875-125 MG tablet Take 1 tablet by mouth 2 (two) times daily.   No facility-administered encounter medications on file as of 04/18/2023.     Lab Results  Component Value Date   WBC 4.6 09/15/2022   HGB 13.3 09/15/2022   HCT 38.6 09/15/2022   PLT 223.0 09/15/2022   GLUCOSE 90 09/15/2022   CHOL 147 09/15/2022   TRIG 146.0 09/15/2022   HDL 44.10 09/15/2022   LDLCALC 74 09/15/2022   ALT 19 09/15/2022   AST 21 09/15/2022   NA 140 09/15/2022   K 3.9 09/15/2022   CL 101 09/15/2022   CREATININE 0.85 09/15/2022   BUN 12 09/15/2022   CO2 32 09/15/2022   TSH 3.73 09/15/2022   HGBA1C 5.3 12/14/2015   MICROALBUR 0.8 11/05/2013       Assessment & Plan:  Benign essential HTN Assessment & Plan: Continues on metoprolol and amlodipine. Blood pressure as outlined.  No change in medication.   Orders: -     Basic metabolic panel  Dysuria -     Urinalysis, Routine w reflex microscopic -     Urine Culture  Tachycardia Assessment & Plan: Had f/u with cardiology 01/25/23 - Cardiac monitor showed no significant sustained arrhythmias, no A-fib or flutter, 1 run of SVT lasting 5 beats, average heart rate 60. Overall benign cardiac monitor.    Thyroid nodule Assessment & Plan: Saw endocrinology 10/2020.  Recommended f/u ultrasound in 1 year.  F/u thyroid ultrasound 01/2022 - stable.    Acute cystitis without  hematuria Assessment & Plan: Recent urinary symptoms as outlined.  Took amoxicillin. Symptoms improved.  Still with some dysuria and pressure as outlined.  Check urine today to confirm no infection.       Dale , MD

## 2023-04-18 NOTE — Assessment & Plan Note (Signed)
Continues on metoprolol and amlodipine. Blood pressure as outlined.  No change in medication.

## 2023-04-18 NOTE — Assessment & Plan Note (Signed)
Recent urinary symptoms as outlined.  Took amoxicillin. Symptoms improved.  Still with some dysuria and pressure as outlined.  Check urine today to confirm no infection.

## 2023-04-18 NOTE — Assessment & Plan Note (Signed)
Had f/u with cardiology 01/25/23 - Cardiac monitor showed no significant sustained arrhythmias, no A-fib or flutter, 1 run of SVT lasting 5 beats, average heart rate 60. Overall benign cardiac monitor.

## 2023-04-20 ENCOUNTER — Other Ambulatory Visit: Payer: Self-pay

## 2023-04-20 ENCOUNTER — Encounter: Payer: Self-pay | Admitting: Internal Medicine

## 2023-04-20 LAB — URINE CULTURE
MICRO NUMBER:: 15860750
SPECIMEN QUALITY:: ADEQUATE

## 2023-04-20 MED ORDER — CEFDINIR 300 MG PO CAPS: 300.0000 mg | ORAL_CAPSULE | Freq: Two times a day (BID) | ORAL | 0 refills | Status: DC

## 2023-05-22 ENCOUNTER — Encounter: Payer: Self-pay | Admitting: Internal Medicine

## 2023-05-22 ENCOUNTER — Other Ambulatory Visit (INDEPENDENT_AMBULATORY_CARE_PROVIDER_SITE_OTHER): Payer: 59

## 2023-05-22 DIAGNOSIS — R3915 Urgency of urination: Secondary | ICD-10-CM | POA: Diagnosis not present

## 2023-05-22 LAB — URINALYSIS, ROUTINE W REFLEX MICROSCOPIC
Bilirubin Urine: NEGATIVE
Hgb urine dipstick: NEGATIVE
Nitrite: NEGATIVE
Specific Gravity, Urine: 1.025 (ref 1.000–1.030)
Urine Glucose: NEGATIVE
Urobilinogen, UA: 0.2 (ref 0.0–1.0)
pH: 6 (ref 5.0–8.0)

## 2023-05-22 NOTE — Telephone Encounter (Signed)
Pt scheduled for urine and a virtual. Orders placed,

## 2023-05-24 ENCOUNTER — Telehealth: Payer: 59 | Admitting: Internal Medicine

## 2023-05-24 VITALS — Ht 64.0 in | Wt 141.0 lb

## 2023-05-24 DIAGNOSIS — N390 Urinary tract infection, site not specified: Secondary | ICD-10-CM

## 2023-05-24 DIAGNOSIS — R350 Frequency of micturition: Secondary | ICD-10-CM | POA: Diagnosis not present

## 2023-05-24 LAB — URINE CULTURE
MICRO NUMBER:: 15977022
SPECIMEN QUALITY:: ADEQUATE

## 2023-05-24 MED ORDER — CEFDINIR 300 MG PO CAPS
300.0000 mg | ORAL_CAPSULE | Freq: Two times a day (BID) | ORAL | 0 refills | Status: DC
Start: 2023-05-24 — End: 2023-06-30

## 2023-05-24 NOTE — Progress Notes (Signed)
Patient ID: Erika Daugherty, female   DOB: 1966-01-11, 58 y.o.   MRN: [id number]   Virtual Visit via video Note  I connected with Erika Daugherty by a video enabled telemedicine application and verified that I am speaking with the correct person using two identifiers. Location patient: home Location provider: work  Persons participating in the virtual visit: patient, provider  The limitations, risks, security and privacy concerns of performing an evaluation and management service by video and the availability of in person appointments have been discussed. It has also been discussed with the patient that there may be a patient responsible charge related to this service. The patient expressed understanding and agreed to proceed.   Reason for visit: work in appt.   HPI: Work in with concerns regarding possible UTI. Reports that symptoms started 5-6 days ago. Noticed increased frequency and increased pressure. No fever. No hematuria reported. No vaginal symptoms. Taking cranberry gummies now. No abdominal pain, nausea or vomiting.    ROS: See pertinent positives and negatives per HPI.  Past Medical History:  Diagnosis Date   Allergy    GERD (gastroesophageal reflux disease)    Heart murmur    as a child   Hypertension     Past Surgical History:  Procedure Laterality Date   VAGINAL DELIVERY     2   WISDOM TOOTH EXTRACTION      Family History  Problem Relation Age of Onset   Hypertension Mother    Hyperlipidemia Mother        triglycerides   Stroke Mother    Cancer Father        bile duct   Heart disease Father 18   Heart disease Maternal Grandfather     SOCIAL HX: reviewed.    Current Outpatient Medications:    cefdinir (OMNICEF) 300 MG capsule, Take 1 capsule (300 mg total) by mouth 2 (two) times daily., Disp: 10 capsule, Rfl: 0   amLODipine (NORVASC) 10 MG tablet, Take 1 tablet (10 mg total) by mouth daily., Disp: 90 tablet, Rfl: 1   bifidobacterium infantis  (ALIGN) capsule, Take 1 capsule by mouth daily., Disp: , Rfl:    metoprolol succinate (TOPROL-XL) 25 MG 24 hr tablet, Take 1 tablet (25 mg total) by mouth daily., Disp: 90 tablet, Rfl: 1   omeprazole (PRILOSEC OTC) 20 MG tablet, Take by mouth., Disp: , Rfl:    triamcinolone (NASACORT) 55 MCG/ACT AERO nasal inhaler, Place 2 sprays into the nose daily., Disp: , Rfl:   EXAM:  GENERAL: alert, oriented, appears well and in no acute distress  HEENT: atraumatic, conjunttiva clear, no obvious abnormalities on inspection of external nose and ears  NECK: normal movements of the head and neck  LUNGS: on inspection no signs of respiratory distress, breathing rate appears normal, no obvious gross SOB, gasping or wheezing  CV: no obvious cyanosis  PSYCH/NEURO: pleasant and cooperative, no obvious depression or anxiety, speech and thought processing grossly intact  ASSESSMENT AND PLAN:  Discussed the following assessment and plan:  Problem List Items Addressed This Visit     Urinary frequency   Urinary symptoms as outlined. Recurring symptoms. Symptoms did resolve after last infection. Returned end of last week.  Urinalysis revealed small leukocytes and 21-50 wbc's.  Given symptoms and urine, feel w/u UTI. Treat with omnicef as directed. Given recurrence, will have urology evaluate.       Frequent UTI - Primary   Refer to urology given recurring infections as outlined.  Relevant Medications   cefdinir (OMNICEF) 300 MG capsule   Other Relevant Orders   Ambulatory referral to Urology    Return if symptoms worsen or fail to improve, for keep scheduled.   I discussed the assessment and treatment plan with the patient. The patient was provided an opportunity to ask questions and all were answered. The patient agreed with the plan and demonstrated an understanding of the instructions.   The patient was advised to call back or seek an in-person evaluation if the symptoms worsen or if the  condition fails to improve as anticipated.   Dale Reid, MD

## 2023-05-28 ENCOUNTER — Encounter: Payer: Self-pay | Admitting: Internal Medicine

## 2023-05-28 DIAGNOSIS — R350 Frequency of micturition: Secondary | ICD-10-CM | POA: Insufficient documentation

## 2023-05-28 NOTE — Assessment & Plan Note (Signed)
Refer to urology given recurring infections as outlined.

## 2023-05-28 NOTE — Assessment & Plan Note (Signed)
Urinary symptoms as outlined. Recurring symptoms. Symptoms did resolve after last infection. Returned end of last week.  Urinalysis revealed small leukocytes and 21-50 wbc's.  Given symptoms and urine, feel w/u UTI. Treat with omnicef as directed. Given recurrence, will have urology evaluate.

## 2023-06-28 ENCOUNTER — Encounter: Payer: Self-pay | Admitting: Internal Medicine

## 2023-06-28 NOTE — Telephone Encounter (Signed)
 Please call and work in - appt

## 2023-06-29 NOTE — Telephone Encounter (Signed)
 Pt scheduled

## 2023-06-30 ENCOUNTER — Other Ambulatory Visit: Payer: Self-pay

## 2023-06-30 ENCOUNTER — Encounter: Payer: Self-pay | Admitting: Urology

## 2023-06-30 ENCOUNTER — Other Ambulatory Visit
Admission: RE | Admit: 2023-06-30 | Discharge: 2023-06-30 | Disposition: A | Discharge status: Home / Self Care | Payer: 59 | Attending: Urology | Admitting: Urology

## 2023-06-30 ENCOUNTER — Ambulatory Visit: Payer: 59 | Admitting: Urology

## 2023-06-30 VITALS — BP 142/79 | HR 64 | Ht 64.0 in | Wt 140.0 lb

## 2023-06-30 DIAGNOSIS — Z87440 Personal history of urinary (tract) infections: Secondary | ICD-10-CM | POA: Diagnosis not present

## 2023-06-30 DIAGNOSIS — Z09 Encounter for follow-up examination after completed treatment for conditions other than malignant neoplasm: Secondary | ICD-10-CM

## 2023-06-30 DIAGNOSIS — N390 Urinary tract infection, site not specified: Secondary | ICD-10-CM | POA: Insufficient documentation

## 2023-06-30 DIAGNOSIS — N393 Stress incontinence (female) (male): Secondary | ICD-10-CM

## 2023-06-30 LAB — URINALYSIS, COMPLETE (UACMP) WITH MICROSCOPIC
Bilirubin Urine: NEGATIVE
Glucose, UA: NEGATIVE mg/dL
Hgb urine dipstick: NEGATIVE
Ketones, ur: NEGATIVE mg/dL
Nitrite: NEGATIVE
Protein, ur: NEGATIVE mg/dL
RBC / HPF: NONE SEEN RBC/hpf (ref 0–5)
Specific Gravity, Urine: 1.02 (ref 1.005–1.030)
pH: 6.5 (ref 5.0–8.0)

## 2023-06-30 LAB — BLADDER SCAN AMB NON-IMAGING

## 2023-06-30 MED ORDER — ESTRADIOL 0.1 MG/GM VA CREA: TOPICAL_CREAM | VAGINAL | 12 refills | Status: AC

## 2023-06-30 NOTE — Patient Instructions (Signed)
For UTI prevention take cranberry tablets, probiotic, D-Mannose daily.  

## 2023-06-30 NOTE — Progress Notes (Signed)
 Marcelle Overlie Plume,acting as a scribe for Vanna Scotland, MD.,have documented all relevant documentation on the behalf of Vanna Scotland, MD,as directed by  Vanna Scotland, MD while in the presence of Vanna Scotland, MD.  06/30/23 2:32 PM   Erika Daugherty [birth year] [id number]  Referring provider: Dale Alfalfa, MD [address] [place],  [place] [address]  Chief Complaint  Patient presents with   Recurrent UTI    HPI: 58 y/o female who presents today for further evaluation of recurrent UTIs. She has a history of recurrent UTIs with three consecutive urinalyses in November, December, and January showing suspicious urine appearance. Cultures have consistently grown E. coli with resistance to multiple antibiotics, including ampicillin and Unisom. She has been treated with multiple rounds of antibiotics including Omnicef, Amoxicillin, and Nitrofurantoin. She does not have any recent upper tract imaging.   Her urinalysis today shows 11-20 white blood cells and yeast presence.   She has started taking cranberry tablets. She reports experiencing pressure and urgency symptoms last Thursday, which resolved with Azo. She denies any history of kidney stones or flank pain. She experiences occasional urinary leakage when bending over or jumping.   She has been menopausal since September 2019.   Results for orders placed or performed in visit on 06/30/23  Bladder Scan (Post Void Residual) in office  Result Value Ref Range   Scan Result 0ml   Results for orders placed or performed during the hospital encounter of 06/30/23  Urinalysis, Complete w Microscopic -  Result Value Ref Range   Color, Urine YELLOW YELLOW   APPearance HAZY (A) CLEAR   Specific Gravity, Urine 1.020 1.005 - 1.030   pH 6.5 5.0 - 8.0   Glucose, UA NEGATIVE NEGATIVE mg/dL   Hgb urine dipstick NEGATIVE NEGATIVE   Bilirubin Urine NEGATIVE NEGATIVE   Ketones, ur NEGATIVE NEGATIVE mg/dL   Protein, ur  NEGATIVE NEGATIVE mg/dL   Nitrite NEGATIVE NEGATIVE   Leukocytes,Ua MODERATE (A) NEGATIVE   Squamous Epithelial / HPF 0-5 0 - 5 /HPF   WBC, UA 11-20 0 - 5 WBC/hpf   RBC / HPF NONE SEEN 0 - 5 RBC/hpf   Bacteria, UA PRESENT (A) NONE SEEN   Budding Yeast PRESENT      PMH: Past Medical History:  Diagnosis Date   Allergy    GERD (gastroesophageal reflux disease)    Heart murmur    as a child   Hypertension    Urinary tract infection     Surgical History: Past Surgical History:  Procedure Laterality Date   VAGINAL DELIVERY     2   WISDOM TOOTH EXTRACTION      Home Medications:  Allergies as of 06/30/2023       Reactions   Erythromycin Diarrhea, Other (See Comments)   Other Reaction(s): other erythromycin   Lisinopril Swelling   Prednisone Other (See Comments), Palpitations   Other Reaction(s): other prednisone   Strawberry Extract Rash        Medication List        Accurate as of June 30, 2023  2:32 PM. If you have any questions, ask your nurse or doctor.          STOP taking these medications    cefdinir 300 MG capsule Commonly known as: OMNICEF Stopped by: Vanna Scotland       TAKE these medications    amLODipine 10 MG tablet Commonly known as: NORVASC Take 1 tablet (10 mg total) by mouth daily.  bifidobacterium infantis capsule Take 1 capsule by mouth daily.   CRANBERRY PO Take by mouth daily.   estradiol 0.1 MG/GM vaginal cream Commonly known as: ESTRACE Estrogen Cream Instruction Discard applicator Apply pea sized amount to tip of finger to urethra before bed. Wash hands well after application. Use Monday, Wednesday and Friday Started by: Vanna Scotland   metoprolol succinate 25 MG 24 hr tablet Commonly known as: TOPROL-XL Take 1 tablet (25 mg total) by mouth daily.   omeprazole 20 MG tablet Commonly known as: PRILOSEC OTC Take by mouth.   triamcinolone 55 MCG/ACT Aero nasal inhaler Commonly known as: NASACORT Place 2  sprays into the nose daily.        Allergies:  Allergies  Allergen Reactions   Erythromycin Diarrhea and Other (See Comments)    Other Reaction(s): other  erythromycin   Lisinopril Swelling   Prednisone Other (See Comments) and Palpitations    Other Reaction(s): other  prednisone   Strawberry Extract Rash    Family History: Family History  Problem Relation Age of Onset   Hypertension Mother    Hyperlipidemia Mother        triglycerides   Stroke Mother    Cancer Father        bile duct   Heart disease Father 63   Heart disease Maternal Grandfather     Social History:  reports that she has never smoked. She has never used smokeless tobacco. She reports that she does not drink alcohol and does not use drugs.   Physical Exam: BP (!) 142/79 (BP Location: Left Arm, Patient Position: Sitting, Cuff Size: Normal)   Pulse 64   Ht 5\' 4"  (1.626 m)   Wt 140 lb (63.5 kg)   LMP 01/18/2018   SpO2 100%   BMI 24.03 kg/m   Constitutional:  Alert and oriented, No acute distress. HEENT: [place] AT, moist mucus membranes.  Trachea midline, no masses. Neurologic: Grossly intact, no focal deficits, moving all 4 extremities. Psychiatric: Normal mood and affect.   Assessment & Plan:    1. 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 - Prescribe topical estrogen cream to be applied to the periurethral area three times a week. Consider daily application for two weeks initially to jumpstart treatment. - Continue cranberry tablets and probiotics. Recommend adding D-mannose supplement for additional UTI prevention. - Order renal ultrasound to rule out any structural abnormalities or kidney stones, given the recurrent nature of the infections. - Patient education on the use of estrogen cream, emphasizing minimal systemic absorption and safety.  Recurrent UTI Prevention Strategies  Stay well hydrated. Get a moderate  amount of exercise. Eat a diet rich in fruit and vegetables. Start a bowel regimen to manage your constipation. Your goal is to have consistent, formed bowel movements that are easy for you to pass. You may use either of the over-the-counter supplements Benefiber or Miralax to help with this. I recommend that you try Benefiber first and move on to Miralax if this is not helping you enough. You may adjust the recommended dose of Miralax (one capful daily) to achieve this goal. Start taking an over-the-counter cranberry supplement for urinary tract health. Take this once or twice daily on an empty stomach, e.g. right before bed. Start taking an over-the-counter d-mannose supplement. Take this daily per packaging instructions. Start taking an over-the-counter probiotic containing the bacterial species called Lactobacillus. Take this daily. Start vaginal estrogen cream. Apply a pea-sized amount  around the opening of the urethra every day for 2 weeks, then three times weekly forever.  2. Stress incontinence - She reports occasional stress incontinence and changes in urinary stream, likely due to age-related changes in pelvic support structures. - No immediate intervention required as bladder is emptying well and symptoms are not significantly impacting quality of life.  Return in about 6 weeks (around 08/11/2023) for assessment of medication efficacy and review of renal ultrasound results.  Marland KitchenCarth[facility] [address] [place], [place] [address] [phone]

## 2023-07-07 ENCOUNTER — Ambulatory Visit: Payer: 59 | Admitting: Internal Medicine

## 2023-07-07 ENCOUNTER — Encounter: Payer: Self-pay | Admitting: Internal Medicine

## 2023-07-07 VITALS — BP 118/70 | HR 73 | Temp 98.0°F | Resp 16 | Ht 64.0 in | Wt 142.6 lb

## 2023-07-07 DIAGNOSIS — M542 Cervicalgia: Secondary | ICD-10-CM | POA: Diagnosis not present

## 2023-07-07 DIAGNOSIS — I10 Essential (primary) hypertension: Secondary | ICD-10-CM | POA: Diagnosis not present

## 2023-07-07 NOTE — Assessment & Plan Note (Signed)
 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 She has done well with exercises/stretches and regular chiropractor follow up. 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  Recommend no more than 16 hours per week with maximum of 4 hours per day. Letter printed and given to pt.

## 2023-07-07 NOTE — Progress Notes (Signed)
 Subjective:    Patient ID: Erika Daugherty, female    DOB: 09-Apr-1966, 58 y.o.   MRN: [id number]  Patient here for  Chief Complaint  Patient presents with   discuss letter for work    HPI Here for a work in appt - work in to discuss some concerns with her work requirements. She has had chronic neck issues for years. Has had xrays and continues to do exercises for her neck. Sees a chiropractor regularly. She worked for years as a Chiropodist and transitioned to research, given above medical issues. 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  Her chiropractor has informed her that she needs to try to avoid this and strained positions. We discussed her work situation and discussed work restrictions. Also, since she has transitioned to research, her blood pressure has been under better control.    Past Medical History:  Diagnosis Date   Allergy    GERD (gastroesophageal reflux disease)    Heart murmur    as a child   Hypertension    Urinary tract infection    Past Surgical History:  Procedure Laterality Date   VAGINAL DELIVERY     2   WISDOM TOOTH EXTRACTION     Family History  Problem Relation Age of Onset   Hypertension Mother    Hyperlipidemia Mother        triglycerides   Stroke Mother    Cancer Father        bile duct   Heart disease Father 65   Heart disease Maternal Grandfather    Social History   Socioeconomic History   Marital status: Married    Spouse name: Not on file   Number of children: Not on file   Years of education: Not on file   Highest education level: Bachelor's degree (e.g., BA, AB, BS)  Occupational History   Not on file  Tobacco Use   Smoking status: Never   Smokeless tobacco: Never  Vaping Use   Vaping status: Never Used  Substance and Sexual Activity   Alcohol use: No    Comment: rare   Drug use: No   Sexual activity: Not on file  Other Topics Concern   Not on file   Social History Narrative   Lives in [place], [place] with husband and two children (16YO, 11YO) and dog.      Work - Tax inspector   Diet - relative healthy   Exercise - walk or elliptical 5 days per week   Social Drivers of Health   Financial Resource Strain: Low Risk  (04/17/2023)   Overall Financial Resource Strain (CARDIA)    Difficulty of Paying Living Expenses: Not hard at all  Food Insecurity: No Food Insecurity (04/17/2023)   Hunger Vital Sign    Worried About Running Out of Food in the Last Year: Never true    Ran Out of Food in the Last Year: Never true  Transportation Needs: No Transportation Needs (04/17/2023)   PRAPARE - Administrator, Civil Service (Medical): No    Lack of Transportation (Non-Medical): No  Physical Activity: Insufficiently Active (04/17/2023)   Exercise Vital Sign    Days of Exercise per Week: 2 days    Minutes of Exercise per Session: 30 min  Stress: No Stress Concern Present (04/17/2023)   [employer] of Occupational Health - Occupational Stress Questionnaire    Feeling of Stress : Not at  all  Social Connections: Socially Integrated (04/17/2023)   Social Connection and Isolation Panel [NHANES]    Frequency of Communication with Friends and Family: Twice a week    Frequency of Social Gatherings with Friends and Family: Once a week    Attends Religious Services: 1 to 4 times per year    Active Member of [employer] or Organizations: Yes    Attends Banker Meetings: 1 to 4 times per year    Marital Status: Married     Review of Systems  Constitutional:  Negative for appetite change and unexpected weight change.  HENT:  Negative for congestion and sinus pressure.   Respiratory:  Negative for cough, chest tightness and shortness of breath.   Cardiovascular:  Negative for chest pain, palpitations and leg swelling.  Gastrointestinal:  Negative for abdominal pain, diarrhea, nausea and vomiting.  Genitourinary:  Negative  for difficulty urinating and dysuria.  Musculoskeletal:  Positive for neck pain. Negative for myalgias.  Skin:  Negative for color change and rash.  Neurological:  Negative for dizziness and headaches.  Psychiatric/Behavioral:  Negative for agitation and dysphoric mood.        Increased stress as outlined.        Objective:     BP 118/70   Pulse 73   Temp 98 F (36.7 C)   Resp 16   Ht 5\' 4"  (1.626 m)   Wt 142 lb 9.6 oz (64.7 kg)   LMP 01/18/2018   SpO2 98%   BMI 24.48 kg/m  Wt Readings from Last 3 Encounters:  07/07/23 142 lb 9.6 oz (64.7 kg)  06/30/23 140 lb (63.5 kg)  05/24/23 141 lb (64 kg)    Physical Exam Vitals reviewed.  Constitutional:      General: She is not in acute distress.    Appearance: Normal appearance.  HENT:     Head: Normocephalic and atraumatic.     Right Ear: External ear normal.     Left Ear: External ear normal.     Mouth/Throat:     Pharynx: No oropharyngeal exudate or posterior oropharyngeal erythema.  Eyes:     General: No scleral icterus.       Right eye: No discharge.        Left eye: No discharge.     Conjunctiva/sclera: Conjunctivae normal.  Neck:     Thyroid: No thyromegaly.  Cardiovascular:     Rate and Rhythm: Normal rate and regular rhythm.  Pulmonary:     Effort: No respiratory distress.     Breath sounds: Normal breath sounds. No wheezing.  Musculoskeletal:        General: No swelling.     Cervical back: Neck supple. No tenderness.     Comments: Increased pulling and discomfort with rotation of her head.   Lymphadenopathy:     Cervical: No cervical adenopathy.  Skin:    Findings: No erythema or rash.  Neurological:     Mental Status: She is alert.  Psychiatric:        Mood and Affect: Mood normal.        Behavior: Behavior normal.         Outpatient Encounter Medications as of 07/07/2023  Medication Sig   amLODipine (NORVASC) 10 MG tablet Take 1 tablet (10 mg total) by mouth daily.   bifidobacterium infantis  (ALIGN) capsule Take 1 capsule by mouth daily.   CRANBERRY PO Take by mouth daily.   estradiol (ESTRACE) 0.1 MG/GM vaginal cream Estrogen Cream Instruction  Discard applicator Apply pea sized amount to tip of finger to urethra before bed. Wash hands well after application. Use Monday, Wednesday and Friday   metoprolol succinate (TOPROL-XL) 25 MG 24 hr tablet Take 1 tablet (25 mg total) by mouth daily.   omeprazole (PRILOSEC OTC) 20 MG tablet Take by mouth.   triamcinolone (NASACORT) 55 MCG/ACT AERO nasal inhaler Place 2 sprays into the nose daily.   No facility-administered encounter medications on file as of 07/07/2023.     Lab Results  Component Value Date   WBC 4.6 09/15/2022   HGB 13.3 09/15/2022   HCT 38.6 09/15/2022   PLT 223.0 09/15/2022   GLUCOSE 99 04/18/2023   CHOL 147 09/15/2022   TRIG 146.0 09/15/2022   HDL 44.10 09/15/2022   LDLCALC 74 09/15/2022   ALT 19 09/15/2022   AST 21 09/15/2022   NA 141 04/18/2023   K 3.7 04/18/2023   CL 104 04/18/2023   CREATININE 0.85 04/18/2023   BUN 15 04/18/2023   CO2 30 04/18/2023   TSH 3.73 09/15/2022   HGBA1C 5.3 12/14/2015   MICROALBUR 0.8 11/05/2013       Assessment & Plan:  Neck pain Assessment & Plan: Neck issues have been better since she has transitioned to research - allowing her to avoid increased strained positions. She has done well with exercises/stretches and regular chiropractor follow up. 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  Recommend no more than 16 hours per week with maximum of 4 hours per day. Letter printed and given to pt.    Benign essential HTN Assessment & Plan: Continues on metoprolol and amlodipine. Blood pressure doing better. Follow.       Dale Ranchester, MD

## 2023-07-07 NOTE — Assessment & Plan Note (Signed)
 Continues on metoprolol and amlodipine. Blood pressure doing better. Follow.

## 2023-07-12 ENCOUNTER — Ambulatory Visit
Admission: RE | Admit: 2023-07-12 | Discharge: 2023-07-12 | Disposition: A | Discharge status: Home / Self Care | Payer: 59 | Source: Ambulatory Visit | Attending: Urology | Admitting: Urology

## 2023-07-12 DIAGNOSIS — N390 Urinary tract infection, site not specified: Secondary | ICD-10-CM | POA: Diagnosis present

## 2023-07-19 ENCOUNTER — Encounter: Payer: Self-pay | Admitting: Urology

## 2023-08-11 ENCOUNTER — Encounter: Payer: Self-pay | Admitting: Family Medicine

## 2023-08-11 ENCOUNTER — Encounter: Payer: Self-pay | Admitting: Internal Medicine

## 2023-08-11 ENCOUNTER — Ambulatory Visit: Admitting: Family Medicine

## 2023-08-11 VITALS — BP 120/80 | HR 55 | Temp 97.7°F | Resp 21 | Ht 64.0 in | Wt 140.0 lb

## 2023-08-11 DIAGNOSIS — R399 Unspecified symptoms and signs involving the genitourinary system: Secondary | ICD-10-CM | POA: Diagnosis not present

## 2023-08-11 LAB — URINALYSIS, ROUTINE W REFLEX MICROSCOPIC
Bilirubin Urine: NEGATIVE
Ketones, ur: NEGATIVE
Nitrite: NEGATIVE
Specific Gravity, Urine: 1.02 (ref 1.000–1.030)
Total Protein, Urine: 30 — AB
Urine Glucose: NEGATIVE
Urobilinogen, UA: 0.2 (ref 0.0–1.0)
pH: 6 (ref 5.0–8.0)

## 2023-08-11 LAB — POCT URINALYSIS DIP (CLINITEK)
Bilirubin, UA: NEGATIVE
Glucose, UA: NEGATIVE mg/dL
Nitrite, UA: NEGATIVE
Spec Grav, UA: 1.025 (ref 1.010–1.025)
Urobilinogen, UA: 0.2 U/dL
pH, UA: 6 (ref 5.0–8.0)

## 2023-08-11 MED ORDER — CEFDINIR 300 MG PO CAPS: 300.0000 mg | ORAL_CAPSULE | Freq: Two times a day (BID) | ORAL | 0 refills | Status: AC

## 2023-08-11 NOTE — Patient Instructions (Signed)
 It was a pleasure meeting you today. Thank you for allowing me to take part in your health care.  Our goals for today as we discussed include:  Urine positive for large amount of white blood cells. This is not true indication of infection.  Nitrites negative, this is produced by some bacteria and indicates infection. There a trace of blood which may be from irritation or infection.  Will send urine for further evaluation  Start Cefdinir 500 mg two times a day Continue Probiotics daily   Increase water intake, 32-64 oz daily Cranberry juice or supplements can help decrease symptoms  Continue estrogen cream as previously ordered   This is a list of the screening recommended for you and due dates:  Health Maintenance  Topic Date Due   HIV Screening  Never done   Hepatitis C Screening  Never done   Zoster (Shingles) Vaccine (1 of 2) Never done   COVID-19 Vaccine (3 - Pfizer risk series) 07/06/2019   Flu Shot  12/01/2023   Pap with HPV screening  03/11/2024   Mammogram  03/13/2025   DTaP/Tdap/Td vaccine (3 - Td or Tdap) 08/19/2030   Colon Cancer Screening  08/15/2032   HPV Vaccine  Aged Out   Meningitis B Vaccine  Aged Out    If you have any questions or concerns, please do not hesitate to call the office at [phone].  I look forward to our next visit and until then take care and stay safe.  Regards,   Dana Allan, MD   [facility]

## 2023-08-11 NOTE — Telephone Encounter (Signed)
 Noted.

## 2023-08-11 NOTE — Progress Notes (Signed)
 SUBJECTIVE:   Chief Complaint  Patient presents with   Urinary Tract Infection   HPI Presents for acute visit  Discussed the use of AI scribe software for clinical note transcription with the patient, who gave verbal consent to proceed.  History of Present Illness The patient is a 58 year old with recurrent urinary tract infections who presents with urinary urgency and cloudy urine.  She began experiencing symptoms yesterday morning, including urinary urgency and cloudy urine. She feels a persistent need to urinate, even after voiding, and does not feel like she is emptying her bladder completely. She describes the sensation as 'uncomfortable' and 'pressure'. No abdominal pain, back pain, fevers, or visible blood in her urine.  She has a history of recurrent urinary tract infections, all previously identified as E. coli. She has been using estradiol  cream three times a week since January, prescribed by a urologist who suspected thinning of the lining might be contributing to her infections. This is her first episode since starting the cream.  She denies recent sexual intercourse and uses Dove sensitive skin bar soap for hygiene. She has no history of kidney stones, and a previous ultrasound of her kidneys showed no stones but did reveal a cyst on one kidney, which required no follow-up.  Her current medications include amlodipine , metoprolol  25 mg, omeprazole, estradiol  cream, and probiotics. She has a known allergy to erythromycin.    PERTINENT PMH / PSH: As above  OBJECTIVE:  BP 120/80   Pulse (!) 55   Temp 97.7 F (36.5 C)   Resp (!) 21   Ht 5\' 4"  (1.626 m)   Wt 140 lb (63.5 kg)   LMP 01/18/2018   SpO2 100%   BMI 24.03 kg/m    Physical Exam Vitals reviewed.  Constitutional:      General: She is not in acute distress.    Appearance: Normal appearance. She is normal weight. She is not ill-appearing, toxic-appearing or diaphoretic.  Eyes:     General:        Right  eye: No discharge.        Left eye: No discharge.     Conjunctiva/sclera: Conjunctivae normal.  Cardiovascular:     Rate and Rhythm: Normal rate.  Pulmonary:     Effort: Pulmonary effort is normal.  Musculoskeletal:        General: Normal range of motion.  Skin:    General: Skin is warm and dry.  Neurological:     General: No focal deficit present.     Mental Status: She is alert and oriented to person, place, and time. Mental status is at baseline.  Psychiatric:        Mood and Affect: Mood normal.        Behavior: Behavior normal.        Thought Content: Thought content normal.        Judgment: Judgment normal.           08/11/2023    8:11 AM 03/08/2023   10:53 AM 01/17/2023   11:40 AM 06/21/2022    8:43 AM 05/17/2022    7:14 AM  Depression screen PHQ 2/9  Decreased Interest 0 0 0 0 0  Down, Depressed, Hopeless 0 0 0 0 0  PHQ - 2 Score 0 0 0 0 0  Altered sleeping 0 0  1 0  Tired, decreased energy 0 0  1 0  Change in appetite 0 0  0 0  Feeling bad or failure about  yourself  0 0  0 0  Trouble concentrating 0 0  0 0  Moving slowly or fidgety/restless 0 0  0 0  Suicidal thoughts 0 0  0 0  PHQ-9 Score 0 0  2 0  Difficult doing work/chores Not difficult at all Not difficult at all  Not difficult at all Not difficult at all      08/11/2023    8:11 AM 03/08/2023   10:53 AM 06/21/2022    8:43 AM 05/17/2022    7:15 AM  GAD 7 : Generalized Anxiety Score  Nervous, Anxious, on Edge 0 0 1 1  Control/stop worrying 0 0 0 0  Worry too much - different things 0 0 1 1  Trouble relaxing 0 0 0 0  Restless 0 0 0 0  Easily annoyed or irritable 0 0 0 0  Afraid - awful might happen 0 0 0 0  Total GAD 7 Score 0 0 2 2  Anxiety Difficulty Not difficult at all Not difficult at all Not difficult at all Not difficult at all    ASSESSMENT/PLAN:  Urinary tract infection symptoms Assessment & Plan: Recurrent UTIs with E. coli. Current urinalysis suggests possible infection or inflammation.  Differential includes non-E. coli bacteria. Estradiol  cream prescribed for possible vaginal atrophy contributing to recurrent infections. Informed consent obtained for cefdinir . - Prescribe cefdinir  for suspected UTI. - Send urine culture to confirm bacterial presence and sensitivity. - Advise increased water intake. - Instruct to avoid soap in the vaginal area and use warm water instead. - Advise monitoring for fever, increased pain, or hematuria and report if symptoms worsen. - Plan to contact over the weekend with culture results and adjust antibiotics if necessary.  Orders: -     POCT URINALYSIS DIP (CLINITEK) -     Urine Culture -     Urinalysis, Routine w reflex microscopic -     Cefdinir ; Take 1 capsule (300 mg total) by mouth 2 (two) times daily for 5 days.  Dispense: 10 capsule; Refill: 0     PDMP reviewed  Return if symptoms worsen or fail to improve, for PCP.  Valli Gaw, MD

## 2023-08-13 LAB — URINE CULTURE
MICRO NUMBER:: 16319452
SPECIMEN QUALITY:: ADEQUATE

## 2023-08-20 ENCOUNTER — Encounter: Payer: Self-pay | Admitting: Family Medicine

## 2023-08-20 DIAGNOSIS — R399 Unspecified symptoms and signs involving the genitourinary system: Secondary | ICD-10-CM | POA: Insufficient documentation

## 2023-08-20 NOTE — Assessment & Plan Note (Signed)
 Recurrent UTIs with E. coli. Current urinalysis suggests possible infection or inflammation. Differential includes non-E. coli bacteria. Estradiol  cream prescribed for possible vaginal atrophy contributing to recurrent infections. Informed consent obtained for cefdinir . - Prescribe cefdinir  for suspected UTI. - Send urine culture to confirm bacterial presence and sensitivity. - Advise increased water intake. - Instruct to avoid soap in the vaginal area and use warm water instead. - Advise monitoring for fever, increased pain, or hematuria and report if symptoms worsen. - Plan to contact over the weekend with culture results and adjust antibiotics if necessary.

## 2023-09-22 ENCOUNTER — Ambulatory Visit (INDEPENDENT_AMBULATORY_CARE_PROVIDER_SITE_OTHER): Payer: BC Managed Care – PPO | Admitting: Internal Medicine

## 2023-09-22 ENCOUNTER — Encounter: Payer: Self-pay | Admitting: Internal Medicine

## 2023-09-22 ENCOUNTER — Telehealth: Payer: Self-pay | Admitting: Internal Medicine

## 2023-09-22 VITALS — BP 132/84 | HR 56 | Ht 64.0 in | Wt 140.8 lb

## 2023-09-22 DIAGNOSIS — I10 Essential (primary) hypertension: Secondary | ICD-10-CM | POA: Diagnosis not present

## 2023-09-22 DIAGNOSIS — E041 Nontoxic single thyroid nodule: Secondary | ICD-10-CM | POA: Diagnosis not present

## 2023-09-22 DIAGNOSIS — M7732 Calcaneal spur, left foot: Secondary | ICD-10-CM | POA: Insufficient documentation

## 2023-09-22 DIAGNOSIS — Z1231 Encounter for screening mammogram for malignant neoplasm of breast: Secondary | ICD-10-CM

## 2023-09-22 DIAGNOSIS — Z0000 Encounter for general adult medical examination without abnormal findings: Secondary | ICD-10-CM | POA: Diagnosis not present

## 2023-09-22 DIAGNOSIS — Z87440 Personal history of urinary (tract) infections: Secondary | ICD-10-CM

## 2023-09-22 DIAGNOSIS — M542 Cervicalgia: Secondary | ICD-10-CM | POA: Diagnosis not present

## 2023-09-22 LAB — COMPREHENSIVE METABOLIC PANEL WITH GFR
ALT: 17 U/L (ref 0–35)
AST: 20 U/L (ref 0–37)
Albumin: 4.5 g/dL (ref 3.5–5.2)
Alkaline Phosphatase: 89 U/L (ref 39–117)
BUN: 14 mg/dL (ref 6–23)
CO2: 31 meq/L (ref 19–32)
Calcium: 9.3 mg/dL (ref 8.4–10.5)
Chloride: 102 meq/L (ref 96–112)
Creatinine, Ser: 0.87 mg/dL (ref 0.40–1.20)
GFR: 74.59 mL/min (ref >60.00)
Glucose, Bld: 85 mg/dL (ref 70–99)
Potassium: 3.5 meq/L (ref 3.5–5.1)
Sodium: 140 meq/L (ref 135–145)
Total Bilirubin: 0.7 mg/dL (ref 0.2–1.2)
Total Protein: 8.4 g/dL — ABNORMAL HIGH (ref 6.0–8.3)

## 2023-09-22 LAB — CBC WITH DIFFERENTIAL/PLATELET
Basophils Absolute: 0 10*3/uL (ref 0.0–0.1)
Basophils Relative: 0.5 % (ref 0.0–3.0)
Eosinophils Absolute: 0.1 10*3/uL (ref 0.0–0.7)
Eosinophils Relative: 2.1 % (ref 0.0–5.0)
HCT: 40.9 % (ref 36.0–46.0)
Hemoglobin: 13.8 g/dL (ref 12.0–15.0)
Lymphocytes Relative: 38.8 % (ref 12.0–46.0)
Lymphs Abs: 1.7 10*3/uL (ref 0.7–4.0)
MCHC: 33.8 g/dL (ref 30.0–36.0)
MCV: 89.4 fl (ref 78.0–100.0)
Monocytes Absolute: 0.6 10*3/uL (ref 0.1–1.0)
Monocytes Relative: 13.2 % — ABNORMAL HIGH (ref 3.0–12.0)
Neutro Abs: 2 10*3/uL (ref 1.4–7.7)
Neutrophils Relative %: 45.4 % (ref 43.0–77.0)
Platelets: 244 10*3/uL (ref 150.0–400.0)
RBC: 4.57 Mil/uL (ref 3.87–5.11)
RDW: 12.7 % (ref 11.5–15.5)
WBC: 4.5 10*3/uL (ref 4.0–10.5)

## 2023-09-22 LAB — LIPID PANEL
Cholesterol: 156 mg/dL (ref 0–200)
HDL: 50.9 mg/dL (ref >39.00)
LDL Cholesterol: 90 mg/dL (ref 0–99)
NonHDL: 105.13
Total CHOL/HDL Ratio: 3
Triglycerides: 78 mg/dL (ref 0.0–149.0)
VLDL: 15.6 mg/dL (ref 0.0–40.0)

## 2023-09-22 LAB — TSH: TSH: 3.19 u[IU]/mL (ref 0.35–5.50)

## 2023-09-22 MED ORDER — AMLODIPINE BESYLATE 10 MG PO TABS: 10.0000 mg | ORAL_TABLET | Freq: Every day | ORAL | 1 refills | Status: AC

## 2023-09-22 MED ORDER — METOPROLOL SUCCINATE ER 25 MG PO TB24: 25.0000 mg | ORAL_TABLET | Freq: Every day | ORAL | 1 refills | Status: DC

## 2023-09-22 NOTE — Assessment & Plan Note (Signed)
 Has seen urology. Using estrace  cream. No recent problems.

## 2023-09-22 NOTE — Assessment & Plan Note (Signed)
 Physical today 09/22/23.  Mammogram 03/14/23 - Birads II.  PAP 03/2021 - negative (atrophy) with negative HPV. Colonoscopy 08/16/22 - normal.  Recommended f/u in 10 years.

## 2023-09-22 NOTE — Assessment & Plan Note (Signed)
 Discussed supports and stretches. Follow

## 2023-09-22 NOTE — Assessment & Plan Note (Signed)
 Saw endocrinology 10/2020.  Recommended f/u ultrasound in 1 year.  F/u thyroid  ultrasound 01/2022 - stable. Check TSH today.

## 2023-09-22 NOTE — Assessment & Plan Note (Signed)
 Some neck pain. Seeing her chiropractor. Discussed exercise. Follow.

## 2023-09-22 NOTE — Telephone Encounter (Signed)
 Opened in error

## 2023-09-22 NOTE — Progress Notes (Signed)
 Subjective:    Patient ID: Erika Daugherty, female    DOB: 1965/05/09, 58 y.o.   MRN: [id number]  Patient here for  Chief Complaint  Patient presents with   Annual Exam    Physical     HPI Here for a physical exam. Saw Dr Lorelei Rogers 12/20/21 - f/u thyroid  nodule. Recommended f/u thyroid  ultrasound. Had ultrasound 02/01/22 - stable. Had colonoscopy 08/12/22 - normal. Recommended f/u in 10 years. Had f/u with cardiology 01/25/23 - Cardiac monitor showed no significant sustained arrhythmias, no A-fib or flutter, 1 run of SVT lasting 5 beats, average heart rate 60. Continues on metoprolol  and amlodipine . Plays pickle ball. Stays active. No chest pain or sob reported. Bowels stable. No vaginal or urinary symptoms. Using estrogen cream three times per week.    Past Medical History:  Diagnosis Date   Allergy    GERD (gastroesophageal reflux disease)    Heart murmur    as a child   Hypertension    Urinary tract infection    Past Surgical History:  Procedure Laterality Date   VAGINAL DELIVERY     2   WISDOM TOOTH EXTRACTION     Family History  Problem Relation Age of Onset   Hypertension Mother    Hyperlipidemia Mother        triglycerides   Stroke Mother    Arthritis Mother    Cancer Father        bile duct   Heart disease Father 46   Asthma Father    Heart disease Maternal Grandfather    Diabetes Maternal Grandmother    ADD / ADHD Son    Diabetes Maternal Aunt    Social History   Socioeconomic History   Marital status: Married    Spouse name: Not on file   Number of children: Not on file   Years of education: Not on file   Highest education level: Bachelor's degree (e.g., BA, AB, BS)  Occupational History   Not on file  Tobacco Use   Smoking status: Never   Smokeless tobacco: Never  Vaping Use   Vaping status: Never Used  Substance and Sexual Activity   Alcohol use: Not Currently    Comment: May have a drink 2x/yr   Drug use: No   Sexual activity: Yes    Birth  control/protection: Post-menopausal, Other-see comments    Comment: Husband vasectomy  Other Topics Concern   Not on file  Social History Narrative   Lives in [place], [place] with husband and two children (16YO, 11YO) and dog.      Work - Tax inspector   Diet - relative healthy   Exercise - walk or elliptical 5 days per week   Social Drivers of Health   Financial Resource Strain: Low Risk  (04/17/2023)   Overall Financial Resource Strain (CARDIA)    Difficulty of Paying Living Expenses: Not hard at all  Food Insecurity: No Food Insecurity (04/17/2023)   Hunger Vital Sign    Worried About Running Out of Food in the Last Year: Never true    Ran Out of Food in the Last Year: Never true  Transportation Needs: No Transportation Needs (04/17/2023)   PRAPARE - Administrator, Civil Service (Medical): No    Lack of Transportation (Non-Medical): No  Physical Activity: Insufficiently Active (04/17/2023)   Exercise Vital Sign    Days of Exercise per Week: 2 days    Minutes of Exercise per Session: 30 min  Stress: No Stress Concern Present (04/17/2023)   [employer] of Occupational Health - Occupational Stress Questionnaire    Feeling of Stress : Not at all  Social Connections: Socially Integrated (04/17/2023)   Social Connection and Isolation Panel [NHANES]    Frequency of Communication with Friends and Family: Twice a week    Frequency of Social Gatherings with Friends and Family: Once a week    Attends Religious Services: 1 to 4 times per year    Active Member of [employer] or Organizations: Yes    Attends Banker Meetings: 1 to 4 times per year    Marital Status: Married     Review of Systems  Constitutional:  Negative for appetite change and unexpected weight change.  HENT:  Negative for congestion, sinus pressure and sore throat.   Eyes:  Negative for pain and visual disturbance.  Respiratory:  Negative for cough, chest tightness and shortness of  breath.   Cardiovascular:  Negative for chest pain, palpitations and leg swelling.  Gastrointestinal:  Negative for abdominal pain, diarrhea, nausea and vomiting.  Genitourinary:  Negative for difficulty urinating and dysuria.  Musculoskeletal:  Negative for joint swelling and myalgias.  Skin:  Negative for color change and rash.  Neurological:  Negative for dizziness and headaches.  Hematological:  Negative for adenopathy. Does not bruise/bleed easily.  Psychiatric/Behavioral:  Negative for agitation and dysphoric mood.        Increased stress -work stress. Discussed.        Objective:     BP 132/84   Pulse (!) 56   Ht 5\' 4"  (1.626 m)   Wt 140 lb 12.8 oz (63.9 kg)   LMP 01/18/2018   SpO2 98%   BMI 24.17 kg/m  Wt Readings from Last 3 Encounters:  09/22/23 140 lb 12.8 oz (63.9 kg)  08/11/23 140 lb (63.5 kg)  07/07/23 142 lb 9.6 oz (64.7 kg)    Physical Exam Vitals reviewed.  Constitutional:      General: She is not in acute distress.    Appearance: Normal appearance. She is well-developed.  HENT:     Head: Normocephalic and atraumatic.     Right Ear: External ear normal.     Left Ear: External ear normal.     Mouth/Throat:     Pharynx: No oropharyngeal exudate or posterior oropharyngeal erythema.  Eyes:     General: No scleral icterus.       Right eye: No discharge.        Left eye: No discharge.     Conjunctiva/sclera: Conjunctivae normal.  Neck:     Thyroid : No thyromegaly.  Cardiovascular:     Rate and Rhythm: Normal rate and regular rhythm.  Pulmonary:     Effort: No tachypnea, accessory muscle usage or respiratory distress.     Breath sounds: Normal breath sounds. No decreased breath sounds or wheezing.  Chest:  Breasts:    Right: No inverted nipple, mass, nipple discharge or tenderness (no axillary adenopathy).     Left: No inverted nipple, mass, nipple discharge or tenderness (no axilarry adenopathy).  Abdominal:     General: Bowel sounds are normal.      Palpations: Abdomen is soft.     Tenderness: There is no abdominal tenderness.  Musculoskeletal:        General: No swelling or tenderness.     Cervical back: Neck supple.  Lymphadenopathy:     Cervical: No cervical adenopathy.  Skin:    Findings: No erythema or  rash.  Neurological:     Mental Status: She is alert and oriented to person, place, and time.  Psychiatric:        Mood and Affect: Mood normal.        Behavior: Behavior normal.         Outpatient Encounter Medications as of 09/22/2023  Medication Sig   bifidobacterium infantis (ALIGN) capsule Take 1 capsule by mouth daily.   CRANBERRY PO Take by mouth daily.   estradiol  (ESTRACE ) 0.1 MG/GM vaginal cream Estrogen Cream Instruction Discard applicator Apply pea sized amount to tip of finger to urethra before bed. Wash hands well after application. Use Monday, Wednesday and Friday   omeprazole (PRILOSEC OTC) 20 MG tablet Take by mouth.   triamcinolone (NASACORT) 55 MCG/ACT AERO nasal inhaler Place 2 sprays into the nose daily.   [DISCONTINUED] amLODipine  (NORVASC ) 10 MG tablet Take 1 tablet (10 mg total) by mouth daily.   [DISCONTINUED] metoprolol  succinate (TOPROL -XL) 25 MG 24 hr tablet Take 1 tablet (25 mg total) by mouth daily.   amLODipine  (NORVASC ) 10 MG tablet Take 1 tablet (10 mg total) by mouth daily.   metoprolol  succinate (TOPROL -XL) 25 MG 24 hr tablet Take 1 tablet (25 mg total) by mouth daily.   No facility-administered encounter medications on file as of 09/22/2023.     Lab Results  Component Value Date   WBC 4.6 09/15/2022   HGB 13.3 09/15/2022   HCT 38.6 09/15/2022   PLT 223.0 09/15/2022   GLUCOSE 99 04/18/2023   CHOL 147 09/15/2022   TRIG 146.0 09/15/2022   HDL 44.10 09/15/2022   LDLCALC 74 09/15/2022   ALT 19 09/15/2022   AST 21 09/15/2022   NA 141 04/18/2023   K 3.7 04/18/2023   CL 104 04/18/2023   CREATININE 0.85 04/18/2023   BUN 15 04/18/2023   CO2 30 04/18/2023   TSH 3.73 09/15/2022    HGBA1C 5.3 12/14/2015   MICROALBUR 0.8 11/05/2013    Ultrasound renal complete Result Date: 07/16/2023 CLINICAL DATA:  recurrent UTI EXAM: RENAL / URINARY TRACT ULTRASOUND COMPLETE COMPARISON:  None Available. FINDINGS: The right kidney measured 10.2 cm and the left kidney measured 10 point cm. The kidneys demonstrate normal echogenicity. Left kidney midpole cyst measures 1.2 cm. This does not need to be followed up with imaging. No shadowing stones are seen. No hydronephrosis. Bladder: Appears normal for degree of bladder distention. IMPRESSION: Left kidney cyst.  Otherwise unremarkable examination. Electronically Signed   By: Sydell Eva M.D.   On: 07/16/2023 22:54       Assessment & Plan:  Healthcare maintenance Assessment & Plan: Physical today 09/22/23.  Mammogram 03/14/23 - Birads II.  PAP 03/2021 - negative (atrophy) with negative HPV. Colonoscopy 08/16/22 - normal.  Recommended f/u in 10 years.    Benign essential HTN Assessment & Plan: Continues on metoprolol  and amlodipine . Blood pressure doing better. No changes today. Follow. Check metabolic panel today.   Orders: -     CBC with Differential/Platelet -     Comprehensive metabolic panel with GFR -     TSH -     Lipid panel  Encounter for screening mammogram for malignant neoplasm of breast -     3D Screening Mammogram, Left and Right; Future  Neck pain Assessment & Plan: Some neck pain. Seeing her chiropractor. Discussed exercise. Follow.    Thyroid  nodule Assessment & Plan: Saw endocrinology 10/2020.  Recommended f/u ultrasound in 1 year.  F/u thyroid  ultrasound 01/2022 - stable.  Check TSH today.    History of frequent urinary tract infections Assessment & Plan: Has seen urology. Using estrace  cream. No recent problems.    Heel spur, left Assessment & Plan: Discussed supports and stretches. Follow    Other orders -     amLODIPine  Besylate; Take 1 tablet (10 mg total) by mouth daily.  Dispense: 90 tablet;  Refill: 1 -     Metoprolol  Succinate ER; Take 1 tablet (25 mg total) by mouth daily.  Dispense: 90 tablet; Refill: 1     Dellar Fenton, MD

## 2023-09-22 NOTE — Patient Instructions (Signed)
 YOUR MAMMOGRAM IS DUE in November, PLEASE CALL AND GET THIS SCHEDULED!

## 2023-09-22 NOTE — Assessment & Plan Note (Signed)
 Continues on metoprolol  and amlodipine . Blood pressure doing better. No changes today. Follow. Check metabolic panel today.

## 2023-09-23 ENCOUNTER — Ambulatory Visit: Payer: Self-pay | Admitting: Internal Medicine

## 2023-09-26 NOTE — Telephone Encounter (Signed)
 Patient aware of below.

## 2023-09-26 NOTE — Telephone Encounter (Signed)
 Please call and let her know that the monocytes are barely elevated. Remainder of blood count wnl. (Can vary from check to check). Total protein 1/10th of a point increased from normal. Can be variant. We will follow.  No further w/up recommended at this time. Let me know if any other questions or problems.

## 2024-03-11 ENCOUNTER — Encounter: Payer: Self-pay | Admitting: Internal Medicine

## 2024-03-11 NOTE — Telephone Encounter (Signed)
 Ok to cancer February appt. Please notify Erika Daugherty. Thanks

## 2024-03-11 NOTE — Telephone Encounter (Signed)
 Ok to schedule Friday at 9:30. Thanks

## 2024-03-15 ENCOUNTER — Ambulatory Visit: Admitting: Internal Medicine

## 2024-03-15 ENCOUNTER — Encounter: Payer: Self-pay | Admitting: Internal Medicine

## 2024-03-15 VITALS — BP 100/70 | HR 60 | Temp 98.3°F | Ht 64.0 in | Wt 133.6 lb

## 2024-03-15 DIAGNOSIS — I10 Essential (primary) hypertension: Secondary | ICD-10-CM | POA: Diagnosis not present

## 2024-03-15 DIAGNOSIS — R0981 Nasal congestion: Secondary | ICD-10-CM

## 2024-03-15 DIAGNOSIS — L989 Disorder of the skin and subcutaneous tissue, unspecified: Secondary | ICD-10-CM | POA: Diagnosis not present

## 2024-03-15 DIAGNOSIS — R000 Tachycardia, unspecified: Secondary | ICD-10-CM | POA: Diagnosis not present

## 2024-03-15 DIAGNOSIS — E041 Nontoxic single thyroid nodule: Secondary | ICD-10-CM | POA: Diagnosis not present

## 2024-03-15 LAB — BASIC METABOLIC PANEL WITH GFR
BUN: 14 mg/dL (ref 6–23)
CO2: 33 meq/L — ABNORMAL HIGH (ref 19–32)
Calcium: 9.2 mg/dL (ref 8.4–10.5)
Chloride: 102 meq/L (ref 96–112)
Creatinine, Ser: 0.88 mg/dL (ref 0.40–1.20)
GFR: 73.33 mL/min (ref >60.00)
Glucose, Bld: 83 mg/dL (ref 70–99)
Potassium: 3.9 meq/L (ref 3.5–5.1)
Sodium: 140 meq/L (ref 135–145)

## 2024-03-15 MED ORDER — AZELASTINE HCL 0.1 % NA SOLN: 1.0000 | Freq: Two times a day (BID) | NASAL | 1 refills | Status: DC

## 2024-03-15 NOTE — Progress Notes (Signed)
 Subjective:    Patient ID: Erika Daugherty, female    DOB: 12/19/1965, 58 y.o.   MRN: [id number]  Patient here for  Chief Complaint  Patient presents with   Medical Management of Chronic Issues    HPI Here for a scheduled follow up - follow up regarding hypertension. Had f/u with cardiology 01/25/23 - Cardiac monitor showed no significant sustained arrhythmias, no A-fib or flutter, 1 run of SVT lasting 5 beats, average heart rate 60. Continues on metoprolol  and amlodipine . Overall stable. Stays active. No chest pain. Breathing stable. Reports some increased nasal congestion - stopped up am. Some drainage and cough associated with drainage. Using nasacort. Discussed adding astelin  nasal spray. Discussed tinnitus and further w/up. Will notify me if desires further w/up. Left facial lesion - discussed dermatology - request - Dr Arlyss.    Past Medical History:  Diagnosis Date   Allergy    GERD (gastroesophageal reflux disease)    Heart murmur 1977   as a child   Hypertension 06/2015   Urinary tract infection    Past Surgical History:  Procedure Laterality Date   VAGINAL DELIVERY     2   WISDOM TOOTH EXTRACTION     Family History  Problem Relation Age of Onset   Hypertension Mother    Hyperlipidemia Mother        triglycerides   Stroke Mother    Arthritis Mother    Cancer Father        bile duct   Heart disease Father 83   Asthma Father    Heart attack Father    Heart disease Maternal Grandfather    Diabetes Maternal Grandmother    ADD / ADHD Son    Diabetes Maternal Aunt    Social History   Socioeconomic History   Marital status: Married    Spouse name: Not on file   Number of children: Not on file   Years of education: Not on file   Highest education level: Bachelor's degree (e.g., BA, AB, BS)  Occupational History   Not on file  Tobacco Use   Smoking status: Never   Smokeless tobacco: Never  Vaping Use   Vaping status: Never Used  Substance and Sexual  Activity   Alcohol use: Not Currently    Comment: May have a drink 2x/yr   Drug use: No   Sexual activity: Yes    Birth control/protection: Post-menopausal, Other-see comments    Comment: Husband vasectomy  Other Topics Concern   Not on file  Social History Narrative   Lives in [place], [place] with husband and two children (16YO, 11YO) and dog.      Work - Tax inspector   Diet - relative healthy   Exercise - walk or elliptical 5 days per week   Social Drivers of Health   Financial Resource Strain: Low Risk  (03/14/2024)   Overall Financial Resource Strain (CARDIA)    Difficulty of Paying Living Expenses: Not hard at all  Food Insecurity: No Food Insecurity (03/14/2024)   Hunger Vital Sign    Worried About Running Out of Food in the Last Year: Never true    Ran Out of Food in the Last Year: Never true  Transportation Needs: No Transportation Needs (03/14/2024)   PRAPARE - Administrator, Civil Service (Medical): No    Lack of Transportation (Non-Medical): No  Physical Activity: Sufficiently Active (03/14/2024)   Exercise Vital Sign    Days of Exercise per Week:  3 days    Minutes of Exercise per Session: 60 min  Stress: No Stress Concern Present (03/14/2024)   [employer] of Occupational Health - Occupational Stress Questionnaire    Feeling of Stress: Only a little  Social Connections: Socially Integrated (03/14/2024)   Social Connection and Isolation Panel    Frequency of Communication with Friends and Family: Once a week    Frequency of Social Gatherings with Friends and Family: Three times a week    Attends Religious Services: More than 4 times per year    Active Member of Clubs or Organizations: Yes    Attends Banker Meetings: More than 4 times per year    Marital Status: Married     Review of Systems  Constitutional:  Negative for appetite change and unexpected weight change.  HENT:  Positive for congestion and postnasal drip.    Respiratory:  Negative for chest tightness and shortness of breath.        Some cough related to drainage.   Cardiovascular:  Negative for chest pain, palpitations and leg swelling.  Gastrointestinal:  Negative for abdominal pain, diarrhea, nausea and vomiting.  Genitourinary:  Negative for difficulty urinating and dysuria.  Musculoskeletal:  Negative for joint swelling and myalgias.  Skin:  Negative for color change and rash.  Neurological:  Negative for dizziness and headaches.  Psychiatric/Behavioral:  Negative for agitation and dysphoric mood.        Objective:     BP 100/70   Pulse 60   Temp 98.3 F (36.8 C) (Oral)   Ht 5' 4 (1.626 m)   Wt 133 lb 9.6 oz (60.6 kg)   LMP 01/18/2018   SpO2 99%   BMI 22.93 kg/m  Wt Readings from Last 3 Encounters:  03/15/24 133 lb 9.6 oz (60.6 kg)  09/22/23 140 lb 12.8 oz (63.9 kg)  08/11/23 140 lb (63.5 kg)    Physical Exam Vitals reviewed.  Constitutional:      General: She is not in acute distress.    Appearance: Normal appearance.  HENT:     Head: Normocephalic and atraumatic.     Right Ear: External ear normal.     Left Ear: External ear normal.  Eyes:     General: No scleral icterus.       Right eye: No discharge.        Left eye: No discharge.     Conjunctiva/sclera: Conjunctivae normal.  Neck:     Thyroid : No thyromegaly.  Cardiovascular:     Rate and Rhythm: Normal rate and regular rhythm.  Pulmonary:     Effort: No respiratory distress.     Breath sounds: Normal breath sounds. No wheezing.  Abdominal:     General: Bowel sounds are normal.     Palpations: Abdomen is soft.     Tenderness: There is no abdominal tenderness.  Musculoskeletal:        General: No swelling or tenderness.     Cervical back: Neck supple. No tenderness.  Lymphadenopathy:     Cervical: No cervical adenopathy.  Skin:    Findings: No erythema or rash.     Comments: Left facial lesion.   Neurological:     Mental Status: She is alert.   Psychiatric:        Mood and Affect: Mood normal.        Behavior: Behavior normal.         Outpatient Encounter Medications as of 03/15/2024  Medication Sig   amLODipine  (  NORVASC ) 10 MG tablet Take 1 tablet (10 mg total) by mouth daily.   azelastine  (ASTELIN ) 0.1 % nasal spray Place 1 spray into both nostrils 2 (two) times daily. Use in each nostril as directed   bifidobacterium infantis (ALIGN) capsule Take 1 capsule by mouth daily.   CRANBERRY PO Take by mouth daily.   estradiol  (ESTRACE ) 0.1 MG/GM vaginal cream Estrogen Cream Instruction Discard applicator Apply pea sized amount to tip of finger to urethra before bed. Wash hands well after application. Use Monday, Wednesday and Friday   metoprolol  succinate (TOPROL -XL) 25 MG 24 hr tablet Take 1 tablet (25 mg total) by mouth daily.   omeprazole (PRILOSEC OTC) 20 MG tablet Take by mouth.   triamcinolone (NASACORT) 55 MCG/ACT AERO nasal inhaler Place 2 sprays into the nose daily.   No facility-administered encounter medications on file as of 03/15/2024.     Lab Results  Component Value Date   WBC 4.5 09/22/2023   HGB 13.8 09/22/2023   HCT 40.9 09/22/2023   PLT 244.0 09/22/2023   GLUCOSE 83 03/15/2024   CHOL 156 09/22/2023   TRIG 78.0 09/22/2023   HDL 50.90 09/22/2023   LDLCALC 90 09/22/2023   ALT 17 09/22/2023   AST 20 09/22/2023   NA 140 03/15/2024   K 3.9 03/15/2024   CL 102 03/15/2024   CREATININE 0.88 03/15/2024   BUN 14 03/15/2024   CO2 33 (H) 03/15/2024   TSH 3.19 09/22/2023   HGBA1C 5.3 12/14/2015    Ultrasound renal complete Result Date: 07/16/2023 CLINICAL DATA:  recurrent UTI EXAM: RENAL / URINARY TRACT ULTRASOUND COMPLETE COMPARISON:  None Available. FINDINGS: The right kidney measured 10.2 cm and the left kidney measured 10 point cm. The kidneys demonstrate normal echogenicity. Left kidney midpole cyst measures 1.2 cm. This does not need to be followed up with imaging. No shadowing stones are seen. No  hydronephrosis. Bladder: Appears normal for degree of bladder distention. IMPRESSION: Left kidney cyst.  Otherwise unremarkable examination. Electronically Signed   By: Fonda Field M.D.   On: 07/16/2023 22:54       Assessment & Plan:  Benign essential HTN Assessment & Plan: Continues on metoprolol  and amlodipine . Blood pressure doing better. No change today. Follow pressures. Follow metabolic panel.   Orders: -     Basic metabolic panel with GFR  Thyroid  nodule Assessment & Plan: Saw endocrinology 10/2020.  Recommended f/u ultrasound in 1 year.  F/u thyroid  ultrasound 01/2022 - stable. Follow tsh.    Tachycardia Assessment & Plan: Had f/u with cardiology 01/25/23 - Cardiac monitor showed no significant sustained arrhythmias, no A-fib or flutter, 1 run of SVT lasting 5 beats, average heart rate 60. Overall benign cardiac monitor. Stable.    Facial lesion Assessment & Plan: Left facial lesion. Refer to dermatology. Requested Dr Arlyss.   Orders: -     Ambulatory referral to Dermatology  Nasal congestion Assessment & Plan: Continue nasacort. Add astelin  nasal spray. Follow. Call with update.    Other orders -     Azelastine  HCl; Place 1 spray into both nostrils 2 (two) times daily. Use in each nostril as directed  Dispense: 30 mL; Refill: 1     Allena Hamilton, MD

## 2024-03-18 ENCOUNTER — Ambulatory Visit: Payer: Self-pay | Admitting: Internal Medicine

## 2024-03-22 ENCOUNTER — Ambulatory Visit: Admitting: Internal Medicine

## 2024-03-24 ENCOUNTER — Encounter: Payer: Self-pay | Admitting: Internal Medicine

## 2024-03-24 DIAGNOSIS — R0981 Nasal congestion: Secondary | ICD-10-CM | POA: Insufficient documentation

## 2024-03-24 NOTE — Assessment & Plan Note (Signed)
 Continue nasacort. Add astelin  nasal spray. Follow. Call with update.

## 2024-03-24 NOTE — Assessment & Plan Note (Signed)
 Left facial lesion. Refer to dermatology. Requested Dr Arlyss.

## 2024-03-24 NOTE — Assessment & Plan Note (Addendum)
 Had f/u with cardiology 01/25/23 - Cardiac monitor showed no significant sustained arrhythmias, no A-fib or flutter, 1 run of SVT lasting 5 beats, average heart rate 60. Overall benign cardiac monitor. Stable.

## 2024-03-24 NOTE — Assessment & Plan Note (Signed)
 Saw endocrinology 10/2020.  Recommended f/u ultrasound in 1 year.  F/u thyroid  ultrasound 01/2022 - stable. Follow tsh.

## 2024-03-24 NOTE — Assessment & Plan Note (Signed)
 Continues on metoprolol  and amlodipine . Blood pressure doing better. No change today. Follow pressures. Follow metabolic panel.

## 2024-04-06 ENCOUNTER — Other Ambulatory Visit: Payer: Self-pay | Admitting: Internal Medicine

## 2024-05-10 ENCOUNTER — Other Ambulatory Visit: Payer: Self-pay | Admitting: Internal Medicine

## 2024-05-10 LAB — HM MAMMOGRAPHY

## 2024-06-06 ENCOUNTER — Other Ambulatory Visit: Payer: Self-pay | Admitting: Internal Medicine

## 2024-06-06 ENCOUNTER — Ambulatory Visit: Admitting: Internal Medicine

## 2024-09-20 ENCOUNTER — Other Ambulatory Visit

## 2024-09-24 ENCOUNTER — Other Ambulatory Visit

## 2024-09-26 ENCOUNTER — Encounter: Admitting: Internal Medicine
